# Patient Record
Sex: Female | Born: 1935 | Race: White | Hispanic: No | Smoking: Never smoker
Health system: Southern US, Community
[De-identification: ages and names within clinical notes are randomized; demographics above are authoritative.]

## PROBLEM LIST (undated history)

## (undated) ENCOUNTER — Emergency Department (HOSPITAL_COMMUNITY): Admission: EM | Payer: Medicare Other

## (undated) DIAGNOSIS — E559 Vitamin D deficiency, unspecified: Secondary | ICD-10-CM

## (undated) DIAGNOSIS — E785 Hyperlipidemia, unspecified: Secondary | ICD-10-CM

## (undated) DIAGNOSIS — N200 Calculus of kidney: Secondary | ICD-10-CM

## (undated) DIAGNOSIS — F32A Depression, unspecified: Secondary | ICD-10-CM

## (undated) DIAGNOSIS — I10 Essential (primary) hypertension: Secondary | ICD-10-CM

## (undated) DIAGNOSIS — M1990 Unspecified osteoarthritis, unspecified site: Secondary | ICD-10-CM

## (undated) DIAGNOSIS — C439 Malignant melanoma of skin, unspecified: Secondary | ICD-10-CM

## (undated) DIAGNOSIS — D649 Anemia, unspecified: Secondary | ICD-10-CM

## (undated) DIAGNOSIS — I499 Cardiac arrhythmia, unspecified: Secondary | ICD-10-CM

## (undated) DIAGNOSIS — I82409 Acute embolism and thrombosis of unspecified deep veins of unspecified lower extremity: Secondary | ICD-10-CM

## (undated) DIAGNOSIS — N189 Chronic kidney disease, unspecified: Secondary | ICD-10-CM

## (undated) DIAGNOSIS — C801 Malignant (primary) neoplasm, unspecified: Secondary | ICD-10-CM

## (undated) DIAGNOSIS — I2699 Other pulmonary embolism without acute cor pulmonale: Secondary | ICD-10-CM

## (undated) DIAGNOSIS — F329 Major depressive disorder, single episode, unspecified: Secondary | ICD-10-CM

## (undated) DIAGNOSIS — M5090 Cervical disc disorder, unspecified, unspecified cervical region: Secondary | ICD-10-CM

## (undated) DIAGNOSIS — Z87440 Personal history of urinary (tract) infections: Secondary | ICD-10-CM

## (undated) HISTORY — PX: ABDOMINAL HYSTERECTOMY: SHX81

## (undated) HISTORY — PX: JOINT REPLACEMENT: SHX530

## (undated) HISTORY — PX: BACK SURGERY: SHX140

## (undated) HISTORY — PX: TONSILLECTOMY: SUR1361

## (undated) HISTORY — PX: COLONOSCOPY W/ POLYPECTOMY: SHX1380

## (undated) HISTORY — PX: OTHER SURGICAL HISTORY: SHX169

---

## 1977-01-13 HISTORY — PX: BREAST EXCISIONAL BIOPSY: SUR124

## 2004-07-23 ENCOUNTER — Ambulatory Visit (HOSPITAL_COMMUNITY): Admission: RE | Admit: 2004-07-23 | Discharge: 2004-07-23 | Payer: Self-pay | Admitting: Neurosurgery

## 2004-08-06 ENCOUNTER — Ambulatory Visit (HOSPITAL_COMMUNITY): Admission: RE | Admit: 2004-08-06 | Discharge: 2004-08-07 | Payer: Self-pay | Admitting: Neurosurgery

## 2005-10-06 ENCOUNTER — Ambulatory Visit: Payer: Self-pay | Admitting: Internal Medicine

## 2006-03-24 ENCOUNTER — Ambulatory Visit: Payer: Self-pay | Admitting: Obstetrics and Gynecology

## 2006-08-17 ENCOUNTER — Ambulatory Visit: Payer: Self-pay | Admitting: Unknown Physician Specialty

## 2007-03-25 ENCOUNTER — Ambulatory Visit: Payer: Self-pay | Admitting: Obstetrics and Gynecology

## 2007-05-05 ENCOUNTER — Ambulatory Visit: Payer: Self-pay | Admitting: Orthopaedic Surgery

## 2007-06-11 ENCOUNTER — Ambulatory Visit: Payer: Self-pay | Admitting: Urology

## 2007-06-14 ENCOUNTER — Ambulatory Visit: Payer: Self-pay | Admitting: Urology

## 2007-06-23 ENCOUNTER — Ambulatory Visit: Payer: Self-pay | Admitting: Urology

## 2007-06-24 ENCOUNTER — Ambulatory Visit: Payer: Self-pay | Admitting: Orthopaedic Surgery

## 2007-07-20 ENCOUNTER — Ambulatory Visit: Payer: Self-pay | Admitting: Urology

## 2007-07-22 ENCOUNTER — Ambulatory Visit: Payer: Self-pay | Admitting: Urology

## 2007-07-30 ENCOUNTER — Ambulatory Visit: Payer: Self-pay | Admitting: Urology

## 2007-08-03 ENCOUNTER — Ambulatory Visit: Payer: Self-pay | Admitting: Orthopaedic Surgery

## 2007-08-10 ENCOUNTER — Ambulatory Visit: Payer: Self-pay | Admitting: Orthopaedic Surgery

## 2007-09-02 ENCOUNTER — Ambulatory Visit: Payer: Self-pay | Admitting: Urology

## 2007-12-13 ENCOUNTER — Ambulatory Visit: Payer: Self-pay | Admitting: Urology

## 2008-01-14 HISTORY — PX: TOTAL KNEE ARTHROPLASTY: SHX125

## 2008-03-24 ENCOUNTER — Ambulatory Visit: Payer: Self-pay | Admitting: Specialist

## 2008-03-27 ENCOUNTER — Ambulatory Visit: Payer: Self-pay | Admitting: Internal Medicine

## 2008-07-06 ENCOUNTER — Emergency Department: Payer: Self-pay | Admitting: Emergency Medicine

## 2008-08-15 ENCOUNTER — Ambulatory Visit: Payer: Self-pay | Admitting: Internal Medicine

## 2008-11-14 ENCOUNTER — Inpatient Hospital Stay (HOSPITAL_COMMUNITY): Admission: RE | Admit: 2008-11-14 | Discharge: 2008-11-17 | Payer: Self-pay | Admitting: Orthopedic Surgery

## 2009-03-29 ENCOUNTER — Ambulatory Visit: Payer: Self-pay | Admitting: Internal Medicine

## 2010-04-01 ENCOUNTER — Ambulatory Visit: Payer: Self-pay | Admitting: Internal Medicine

## 2010-04-17 LAB — BASIC METABOLIC PANEL
BUN: 13 mg/dL (ref 6–23)
CO2: 29 mEq/L (ref 19–32)
Calcium: 8.1 mg/dL — ABNORMAL LOW (ref 8.4–10.5)
GFR calc Af Amer: >60 mL/min (ref >60)
GFR calc Af Amer: >60 mL/min (ref >60)
GFR calc non Af Amer: >60 mL/min (ref >60)
GFR calc non Af Amer: >60 mL/min (ref >60)
Glucose, Bld: 119 mg/dL — ABNORMAL HIGH (ref 70–99)
Glucose, Bld: 124 mg/dL — ABNORMAL HIGH (ref 70–99)
Potassium: 3.6 mEq/L (ref 3.5–5.1)
Potassium: 4.2 mEq/L (ref 3.5–5.1)
Sodium: 134 mEq/L — ABNORMAL LOW (ref 135–145)
Sodium: 139 mEq/L (ref 135–145)

## 2010-04-17 LAB — CBC
HCT: 28.5 % — ABNORMAL LOW (ref 36.0–46.0)
HCT: 30.2 % — ABNORMAL LOW (ref 36.0–46.0)
Hemoglobin: 10.1 g/dL — ABNORMAL LOW (ref 12.0–15.0)
MCV: 94.4 fL (ref 78.0–100.0)
MCV: 95.7 fL (ref 78.0–100.0)
RBC: 3.02 MIL/uL — ABNORMAL LOW (ref 3.87–5.11)
RBC: 3.16 MIL/uL — ABNORMAL LOW (ref 3.87–5.11)
RDW: 13.1 % (ref 11.5–15.5)
WBC: 8.7 10*3/uL (ref 4.0–10.5)

## 2010-04-18 LAB — DIFFERENTIAL
Basophils Absolute: 0 10*3/uL (ref 0.0–0.1)
Lymphocytes Relative: 26 % (ref 12–46)
Monocytes Relative: 9 % (ref 3–12)
Neutro Abs: 3.6 10*3/uL (ref 1.7–7.7)
Neutrophils Relative %: 63 % (ref 43–77)

## 2010-04-18 LAB — APTT: aPTT: 30 seconds (ref 24–37)

## 2010-04-18 LAB — CBC
HCT: 40 % (ref 36.0–46.0)
Hemoglobin: 13.4 g/dL (ref 12.0–15.0)
MCHC: 33.4 g/dL (ref 30.0–36.0)
MCV: 94.1 fL (ref 78.0–100.0)
Platelets: 280 10*3/uL (ref 150–400)
RBC: 4.25 MIL/uL (ref 3.87–5.11)
RDW: 13.1 % (ref 11.5–15.5)

## 2010-04-18 LAB — BASIC METABOLIC PANEL
BUN: 14 mg/dL (ref 6–23)
CO2: 33 mEq/L — ABNORMAL HIGH (ref 19–32)
Calcium: 9 mg/dL (ref 8.4–10.5)
GFR calc Af Amer: >60 mL/min (ref >60)
Potassium: 4.3 mEq/L (ref 3.5–5.1)
Sodium: 138 mEq/L (ref 135–145)

## 2010-04-18 LAB — URINE MICROSCOPIC-ADD ON

## 2010-04-18 LAB — ABO/RH: ABO/RH(D): O POS

## 2010-04-18 LAB — URINALYSIS, ROUTINE W REFLEX MICROSCOPIC
Bilirubin Urine: NEGATIVE
Protein, ur: NEGATIVE mg/dL
Specific Gravity, Urine: 1.015 (ref 1.005–1.030)
Urobilinogen, UA: 0.2 mg/dL (ref 0.0–1.0)
pH: 7 (ref 5.0–8.0)

## 2010-04-18 LAB — TYPE AND SCREEN: Antibody Screen: NEGATIVE

## 2010-05-31 NOTE — Op Note (Signed)
NAME:  Anita Bailey, SWISS NO.:  [id number]   MEDICAL RECORD NO.:  [id number]          PATIENT TYPE:  OIB   LOCATION:  2899                         FACILITY:  [facility]   PHYSICIAN:  Reinaldo Meeker, M.D. DATE OF BIRTH:  May 18, 1934   DATE OF PROCEDURE:  08/06/2004  DATE OF DISCHARGE:                                 OPERATIVE REPORT   PREOPERATIVE DIAGNOSIS:  Spinal stenosis L4-L5.   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   PROCEDURE:  Bilateral L4-L5 decompressive laminotomies followed by right L4-  L5 microdiscectomy.   SECONDARY PROCEDURE:  Microdissection L4-L5 disc and L5 nerve root  bilaterally.   SURGEON:  Reinaldo Meeker, M.D.   ASSISTANT:  Kathaleen Maser. Pool, M.D.   PROCEDURE IN DETAIL:  After being placed in the prone position, the  patient's back was prepped and draped in the usual sterile fashion.  A  localizing x-ray was taken prior to incision to identify the appropriate  level.  A midline incision was made above the spinous processes of L4 and  L5.  Using Bovie cutting current, the incision was carried down to the  spinous processes.  Subperiosteal dissection was then carried out  bilaterally on the spinous processes and lamina.  Self-retaining retractor  was placed for exposure.  An x-ray showed we approached the appropriate  level.  Starting on the patient's left side, a laminotomy was performed by  removing the inferior 1/2 of the L4 lamina and the medial 1/3 of the facet  joint and the superior 1/2 of the L5 lamina.  Residual bone and ligamentum  flavum were removed in a piecemeal fashion.  A similar decompression was  then carried out on the patient's right side, once again, removing the  inferior 1/2 of the L4 lamina, the medial 1/3 of the facet joint, and the  superior 1/2 of the L5 lamina.  Once again, residual bone and ligamentum  flavum were removed in a piecemeal fashion.  Ligament under the spinous  process in the midline was also removed to effect a midline decompression.  At this time, the microscope was draped, brought onto the field, and used  for the remainder of the case.  Within the patient's right side,  microdissection technique was used to identify the L4-L5 disc.  This was  found to be diffusely herniated with neural compression.  It was elected to  incise the disc and clean it out bilaterally with a single sided approach  through the right side.  The annulus on the right was, therefore, incised  with a 15 blade.  With the use of pituitary rongeurs and curets, disc space  clean out was carried out and great care was taken to avoid injury to the  neural elements.  This was successfully done.  At this point, inspection was  carried out in all direction without any evidence of residual compression  and none could be identified.  Large amounts of irrigation was carried out  at this time with any bleeding controlled with bipolar coagulation and  Gelfoam.  The wound was then closed using interrupted Vicryl in the muscle,  fascia, subcutaneous and subcuticular tissues, and staples on the skin.  Sterile dressings were then applied and the patient was extubated and taken  to the recovery room in stable condition.     ROK/MEDQ  D:  08/06/2004  T:  08/06/2004  Job:  [id number]

## 2010-08-30 ENCOUNTER — Emergency Department: Payer: Self-pay | Admitting: Emergency Medicine

## 2010-09-04 ENCOUNTER — Ambulatory Visit: Payer: Self-pay | Admitting: Physician Assistant

## 2010-09-04 ENCOUNTER — Inpatient Hospital Stay: Payer: Self-pay | Admitting: Internal Medicine

## 2010-11-07 ENCOUNTER — Emergency Department: Payer: Self-pay | Admitting: Emergency Medicine

## 2011-04-02 ENCOUNTER — Ambulatory Visit: Payer: Self-pay | Admitting: Internal Medicine

## 2011-04-07 ENCOUNTER — Ambulatory Visit: Payer: Self-pay | Admitting: Internal Medicine

## 2011-07-31 ENCOUNTER — Encounter (HOSPITAL_COMMUNITY): Payer: Self-pay | Admitting: Pharmacy Technician

## 2011-08-05 ENCOUNTER — Ambulatory Visit (HOSPITAL_COMMUNITY)
Admission: RE | Admit: 2011-08-05 | Discharge: 2011-08-05 | Disposition: A | Discharge status: Home / Self Care | Payer: Medicare Other | Source: Ambulatory Visit | Attending: Orthopedic Surgery | Admitting: Orthopedic Surgery

## 2011-08-05 ENCOUNTER — Encounter (HOSPITAL_COMMUNITY): Payer: Self-pay

## 2011-08-05 ENCOUNTER — Encounter (HOSPITAL_COMMUNITY)
Admission: RE | Admit: 2011-08-05 | Discharge: 2011-08-05 | Disposition: A | Discharge status: Home / Self Care | Payer: Medicare Other | Source: Ambulatory Visit | Attending: Orthopedic Surgery | Admitting: Orthopedic Surgery

## 2011-08-05 DIAGNOSIS — I10 Essential (primary) hypertension: Secondary | ICD-10-CM | POA: Insufficient documentation

## 2011-08-05 DIAGNOSIS — Z86711 Personal history of pulmonary embolism: Secondary | ICD-10-CM | POA: Insufficient documentation

## 2011-08-05 DIAGNOSIS — Z01812 Encounter for preprocedural laboratory examination: Secondary | ICD-10-CM | POA: Insufficient documentation

## 2011-08-05 DIAGNOSIS — M954 Acquired deformity of chest and rib: Secondary | ICD-10-CM | POA: Insufficient documentation

## 2011-08-05 DIAGNOSIS — M1710 Unilateral primary osteoarthritis, unspecified knee: Secondary | ICD-10-CM | POA: Insufficient documentation

## 2011-08-05 HISTORY — DX: Essential (primary) hypertension: I10

## 2011-08-05 HISTORY — DX: Chronic kidney disease, unspecified: N18.9

## 2011-08-05 HISTORY — DX: Other pulmonary embolism without acute cor pulmonale: I26.99

## 2011-08-05 HISTORY — DX: Malignant (primary) neoplasm, unspecified: C80.1

## 2011-08-05 HISTORY — DX: Cardiac arrhythmia, unspecified: I49.9

## 2011-08-05 HISTORY — DX: Unspecified osteoarthritis, unspecified site: M19.90

## 2011-08-05 NOTE — Patient Instructions (Signed)
20 LAVEDA DEMEDEIROS  08/05/2011   Your procedure is scheduled on: 08/11/11 [id number]  Report to [facility] at 0530 AM.  Call this number if you have problems the morning of surgery: [phone]   Remember:   Do not eat food:After Midnight.  May have clear liquids:until Midnight .    Take these medicines the morning of surgery with A SIP OF WATER:   Do not wear jewelry, make-up or nail polish.  Do not wear lotions, powders, or perfumes  Do not shave 48 hours prior to surgery.   Do not bring valuables to the hospital.  Contacts, dentures or bridgework may not be worn into surgery.  Leave suitcase in the car. After surgery it may be brought to your room.  For patients admitted to the hospital, checkout time is 11:00 AM the day of discharge.     Special Instructions: CHG Shower Use Special Wash: 1/2 bottle night before surgery and 1/2 bottle morning of surgery. shower chin to toes with CHG.  Wash face and private parts with regular soap.   Please read over the following fact sheets that you were given: MRSA Information, Blood Transfusion Fact Sheet, Incentive Spirometry Fact Sheet, coughing and deep breathing exercises, leg exercises

## 2011-08-05 NOTE — Progress Notes (Signed)
08/05/11 FAx and confirmation received abnormal urinalysis done by PCP on 07/29/11 to DR Charlann Boxer.

## 2011-08-06 MED ORDER — CEFAZOLIN SODIUM 1-5 GM-% IV SOLN: 1.0000 g | INTRAVENOUS | Status: DC

## 2011-08-06 NOTE — H&P (Signed)
Anita Bailey is an 77 y.o. female.    Chief Complaint: left knee OA and pain   HPI: Pt is a 77 y.o. female complaining of left knee pain since August of 2012.  Pain start with 2 falls on her driveway that both cause lacerations across the knee.  After that time she was walking in [place], [place] when she had a horrible pain in her left knee which she states was later diagnosed as a meniscus tear.  She had originally when a a Educational psychologist in [place] and then transferred to Dr. Charlann Boxer.  Pain had continually increased since the beginning. X-rays in the clinic and a MRI show end-stage arthritic and degenerative changes of the left knee. Pt has tried various conservative treatments which have failed to alleviate their symptoms including NSAIDs and steroid injections. Various options are discussed with the patient. Risks, benefits and expectations were discussed with the patient. Patient understand the risks, benefits and expectations and wishes to proceed with surgery.   PCP:  Danella Penton., [place]  D/C Plans:  SNF/Rehab  Post-op Meds:  No Rx given - Will need to start on Xarelto for 2 weeks and then ASA  Tranexamic Acid:   Not to be given - previous PE  Decadron:   To be given  PMH: Past Medical History  Diagnosis Date  . Hypertension   . Dysrhythmia     hx of heart racing   . Chronic kidney disease     current uti 08/05/11   . Cancer     hx of melanoma in 1971, basal cell   . Arthritis   . Pulmonary emboli     bilateral 8/12 hospitalized     PSH: Past Surgical History  Procedure Date  . Other surgical history     melanoma removed   . Left wrist      surgery x 2   . Abdominal hysterectomy     1981    Social History:  reports that she has never smoked. She has never used smokeless tobacco. She reports that she does not use illicit drugs. Her alcohol history not on file.  Allergies:  Allergies  Allergen Reactions  . Penicillins Anaphylaxis  . Clindamycin/Lincomycin Rash    . Diclofenac Rash    Medications: No current facility-administered medications for this encounter.   Current Outpatient Prescriptions  Medication Sig Dispense Refill  . ciprofloxacin (CIPRO) 250 MG tablet Take 250 mg by mouth 2 (two) times daily.      [name] HYDROcodone-acetaminophen (VICODIN) 5-500 MG per tablet Take 1 tablet by mouth every 6 (six) hours as needed. For pain      . metoprolol succinate (TOPROL-XL) 50 MG 24 hr tablet Take 50 mg by mouth every morning. Take with or immediately following a meal.      . naproxen sodium (ANAPROX) 220 MG tablet Take 440 mg by mouth 2 (two) times daily as needed.      . pregabalin (LYRICA) 25 MG capsule Take 25 mg by mouth 2 (two) times daily.      [name] venlafaxine (EFFEXOR) 75 MG tablet Take 75 mg by mouth every morning.      . zolpidem (AMBIEN) 5 MG tablet Take 2.5 mg by mouth at bedtime.       Facility-Administered Medications Ordered in Other Encounters  Medication Dose Route Frequency Provider Last Rate Last Dose  . DISCONTD: ceFAZolin (ANCEF) IVPB 1 g/50 mL premix  1 g Intravenous 60 min Pre-Op Genelle Gather [place], [place]  Results for orders placed during the hospital encounter of 08/05/11 (from the past 48 hour(s))  SURGICAL PCR SCREEN     Status: Normal   Collection Time   08/05/11 11:15 AM      Component Value Range Comment   MRSA, PCR NEGATIVE  NEGATIVE    Staphylococcus aureus NEGATIVE  NEGATIVE    Dg Chest 2 View  08/05/2011  *RADIOLOGY REPORT*  Clinical Data: Hypertension, pulmonary emboli history, preoperative evaluation for left knee replacement  CHEST - 2 VIEW  Comparison: None.  Findings: Normal heart size and vascularity.  Negative for CHF or pneumonia.  No collapse, consolidation, or effusion.  Right apical pleural parenchymal scarring evident.  Trachea is midline.  Slight upper thoracic scoliosis evident.  Pectus excavatum deformity suspect on the lateral view.  IMPRESSION: No acute chest finding.  Original Report Authenticated  By: Judie Petit. Ruel Favors, M.D.    ROS: Review of Systems  Constitutional: Negative.   HENT: Negative.   Eyes: Negative.   Respiratory: Negative.   Cardiovascular: Negative.   Gastrointestinal: Negative.   Genitourinary: Positive for urgency.  Musculoskeletal: Positive for joint pain.  Skin: Negative.   Neurological: Negative.   Endo/Heme/Allergies: Negative.   Psychiatric/Behavioral: Negative.      Physical Exam: BP: 113/45 ; HR: 66 ; Resp: 14 Physical Exam  Constitutional: She is oriented to person, place, and time and well-developed, well-nourished, and in no distress.  HENT:  Head: Normocephalic and atraumatic.  Nose: Nose normal.  Mouth/Throat: Oropharynx is clear and moist.  Eyes: Pupils are equal, round, and reactive to light.  Neck: Neck supple. No JVD present. No tracheal deviation present. No thyromegaly present.  Cardiovascular: Normal rate, regular rhythm and intact distal pulses.   Pulmonary/Chest: Effort normal and breath sounds normal. No respiratory distress. She has no wheezes. She has no rales. She exhibits no tenderness.  Abdominal: Soft. There is no tenderness. There is no guarding.  Musculoskeletal:       Left knee: She exhibits decreased range of motion, swelling, laceration (Healed laceration from previous fall. Distal portion of the knee that starts medial and goes toward the midline of the left knee.) and bony tenderness (with crepitus). She exhibits no effusion, no ecchymosis and no deformity. tenderness found.  Lymphadenopathy:    She has no cervical adenopathy.  Neurological: She is alert and oriented to person, place, and time.  Skin: Skin is warm and dry.  Psychiatric: Affect normal.      Assessment/Plan Assessment: left knee OA and pain   Plan: Patient will undergo a left total knee arthroplasty on 08/11/2011 per Dr. Charlann Boxer at [facility]. Risks benefits and expectation were discussed with the patient. Patient understand risks, benefits and  expectation and wishes to proceed.   Anastasio Auerbach Nikky Duba   PAC  08/06/2011, 5:31 PM

## 2011-08-06 NOTE — Progress Notes (Signed)
08/05/11 Faxed and confirmation received to Dr Charlann Boxer regarding penicillin allergy and ancef ordered preop.   08/06/11 received new order by fax from Dr Charlann Boxer to change preop antibiotic to  Vancomycin order placed in epic.  Placed fax from Dr Charlann Boxer on front of chart

## 2011-08-08 ENCOUNTER — Emergency Department: Payer: Self-pay | Admitting: Emergency Medicine

## 2011-08-10 NOTE — Anesthesia Preprocedure Evaluation (Addendum)
Anesthesia Evaluation  Patient identified by MRN, date of birth, ID band Patient awake  General Assessment Comment:Past Medical History   Diagnosis  Date   .  Hypertension     .  Dysrhythmia         hx of heart racing    .  Chronic kidney disease         current uti 08/05/11    .  Cancer         hx of melanoma in 1971, basal cell    .  Arthritis     .  Pulmonary emboli         bilateral 8/12 hospitalized      Reviewed: Allergy & Precautions, H&P , NPO status , Patient's Chart, lab work & pertinent test results  Airway Mallampati: II TM Distance: >3 FB Neck ROM: Full    Dental No notable dental hx.    Pulmonary  CXR: No acute chest finding.  H/O PE bilateral 8/12 breath sounds clear to auscultation  Pulmonary exam normal       Cardiovascular Exercise Tolerance: Good hypertension, Pt. on medications and Pt. on home beta blockers + dysrhythmias Rhythm:Regular Rate:Normal  H/O of heart racing.   Neuro/Psych negative neurological ROS  negative psych ROS   GI/Hepatic negative GI ROS, Neg liver ROS,   Endo/Other  negative endocrine ROS  Renal/GU negative Renal ROS  negative genitourinary   Musculoskeletal negative musculoskeletal ROS (+)   Abdominal   Peds negative pediatric ROS (+)  Hematology negative hematology ROS (+)   Anesthesia Other Findings   Reproductive/Obstetrics negative OB ROS                          Anesthesia Physical Anesthesia Plan  ASA: II  Anesthesia Plan: Spinal   Post-op Pain Management:    Induction: Intravenous  Airway Management Planned:   Additional Equipment:   Intra-op Plan:   Post-operative Plan: Extubation in OR  Informed Consent: I have reviewed the patients History and Physical, chart, labs and discussed the procedure including the risks, benefits and alternatives for the proposed anesthesia with the patient or authorized representative who  has indicated his/her understanding and acceptance.   Dental advisory given  Plan Discussed with: CRNA  Anesthesia Plan Comments: (Discussed risks/benefits of spinal including headache, backache, failure, bleeding, infection, and nerve damage. Patient consents to spinal. Questions answered. Coagulation studies and platelet count acceptable. )       Anesthesia Quick Evaluation

## 2011-08-11 ENCOUNTER — Encounter (HOSPITAL_COMMUNITY): Payer: Self-pay | Admitting: Anesthesiology

## 2011-08-11 ENCOUNTER — Inpatient Hospital Stay (HOSPITAL_COMMUNITY)
Admission: RE | Admit: 2011-08-11 | Discharge: 2011-08-14 | DRG: 470 | Disposition: A | Discharge status: Skilled Nursing Facility | Payer: Medicare Other | Source: Ambulatory Visit | Attending: Orthopedic Surgery | Admitting: Orthopedic Surgery

## 2011-08-11 ENCOUNTER — Encounter (HOSPITAL_COMMUNITY): Payer: Self-pay | Admitting: *Deleted

## 2011-08-11 ENCOUNTER — Ambulatory Visit (HOSPITAL_COMMUNITY): Payer: Medicare Other | Admitting: Anesthesiology

## 2011-08-11 ENCOUNTER — Encounter (HOSPITAL_COMMUNITY)
Admission: RE | Disposition: A | Discharge status: Skilled Nursing Facility | Payer: Self-pay | Source: Ambulatory Visit | Attending: Orthopedic Surgery

## 2011-08-11 DIAGNOSIS — Z9181 History of falling: Secondary | ICD-10-CM

## 2011-08-11 DIAGNOSIS — Z79899 Other long term (current) drug therapy: Secondary | ICD-10-CM

## 2011-08-11 DIAGNOSIS — M1710 Unilateral primary osteoarthritis, unspecified knee: Principal | ICD-10-CM | POA: Diagnosis present

## 2011-08-11 DIAGNOSIS — I10 Essential (primary) hypertension: Secondary | ICD-10-CM | POA: Diagnosis present

## 2011-08-11 DIAGNOSIS — Z96659 Presence of unspecified artificial knee joint: Secondary | ICD-10-CM

## 2011-08-11 DIAGNOSIS — Z85820 Personal history of malignant melanoma of skin: Secondary | ICD-10-CM

## 2011-08-11 DIAGNOSIS — Z86711 Personal history of pulmonary embolism: Secondary | ICD-10-CM

## 2011-08-11 HISTORY — PX: TOTAL KNEE ARTHROPLASTY: SHX125

## 2011-08-11 LAB — TYPE AND SCREEN: ABO/RH(D): O POS

## 2011-08-11 SURGERY — ARTHROPLASTY, KNEE, TOTAL
Anesthesia: Spinal | Site: Knee | Laterality: Left | Wound class: Clean

## 2011-08-11 MED ORDER — VANCOMYCIN HCL IN DEXTROSE 1-5 GM/200ML-% IV SOLN
1000.0000 mg | Freq: Once | INTRAVENOUS | Status: AC
  Administered 2011-08-11: 1000 mg via INTRAVENOUS

## 2011-08-11 MED ORDER — METHOCARBAMOL 100 MG/ML IJ SOLN
500.0000 mg | Freq: Four times a day (QID) | INTRAVENOUS | Status: DC | PRN
  Administered 2011-08-11: 500 mg via INTRAVENOUS
  Filled 2011-08-11: qty 5

## 2011-08-11 MED ORDER — BUPIVACAINE-EPINEPHRINE PF 0.25-1:200000 % IJ SOLN
INTRAMUSCULAR | Status: AC
  Filled 2011-08-11: qty 60

## 2011-08-11 MED ORDER — MENTHOL 3 MG MT LOZG
1.0000 | LOZENGE | OROMUCOSAL | Status: DC | PRN
  Filled 2011-08-11: qty 9

## 2011-08-11 MED ORDER — ONDANSETRON HCL 4 MG PO TABS: 4.0000 mg | ORAL_TABLET | Freq: Four times a day (QID) | ORAL | Status: DC | PRN

## 2011-08-11 MED ORDER — VENLAFAXINE HCL 75 MG PO TABS
75.0000 mg | ORAL_TABLET | Freq: Every morning | ORAL | Status: DC
  Administered 2011-08-12 – 2011-08-14 (×3): 75 mg via ORAL
  Filled 2011-08-11 (×3): qty 1

## 2011-08-11 MED ORDER — FLEET ENEMA 7-19 GM/118ML RE ENEM: 1.0000 | ENEMA | Freq: Once | RECTAL | Status: AC | PRN

## 2011-08-11 MED ORDER — ONDANSETRON HCL 4 MG/2ML IJ SOLN
INTRAMUSCULAR | Status: DC | PRN
  Administered 2011-08-11: 4 mg via INTRAVENOUS

## 2011-08-11 MED ORDER — ZOLPIDEM TARTRATE 5 MG PO TABS
2.5000 mg | ORAL_TABLET | Freq: Every evening | ORAL | Status: DC | PRN
  Administered 2011-08-11 – 2011-08-13 (×3): 5 mg via ORAL
  Filled 2011-08-11 (×3): qty 1

## 2011-08-11 MED ORDER — PREGABALIN 25 MG PO CAPS
25.0000 mg | ORAL_CAPSULE | Freq: Two times a day (BID) | ORAL | Status: DC
  Administered 2011-08-11 – 2011-08-14 (×6): 25 mg via ORAL
  Filled 2011-08-11 (×6): qty 1

## 2011-08-11 MED ORDER — HYDROMORPHONE HCL PF 1 MG/ML IJ SOLN: 0.2500 mg | INTRAMUSCULAR | Status: DC | PRN

## 2011-08-11 MED ORDER — SODIUM CHLORIDE 0.9 % IV SOLN
INTRAVENOUS | Status: AC
  Administered 2011-08-11: 12:00:00 via INTRAVENOUS
  Filled 2011-08-11 (×6): qty 1000

## 2011-08-11 MED ORDER — CHLORHEXIDINE GLUCONATE 4 % EX LIQD
60.0000 mL | Freq: Once | CUTANEOUS | Status: DC
  Filled 2011-08-11: qty 60

## 2011-08-11 MED ORDER — METOCLOPRAMIDE HCL 10 MG PO TABS: 5.0000 mg | ORAL_TABLET | Freq: Three times a day (TID) | ORAL | Status: DC | PRN

## 2011-08-11 MED ORDER — HYDROCODONE-ACETAMINOPHEN 7.5-325 MG PO TABS
1.0000 | ORAL_TABLET | ORAL | Status: DC
  Administered 2011-08-11: 1 via ORAL
  Administered 2011-08-11 – 2011-08-12 (×3): 2 via ORAL
  Administered 2011-08-12: 1 via ORAL
  Administered 2011-08-12: 2 via ORAL
  Administered 2011-08-12: 1 via ORAL
  Administered 2011-08-12 – 2011-08-13 (×3): 2 via ORAL
  Administered 2011-08-13 (×3): 1 via ORAL
  Administered 2011-08-13: 2 via ORAL
  Administered 2011-08-14: 1 via ORAL
  Filled 2011-08-11: qty 1
  Filled 2011-08-11 (×6): qty 2
  Filled 2011-08-11 (×3): qty 1
  Filled 2011-08-11 (×3): qty 2
  Filled 2011-08-11 (×2): qty 1

## 2011-08-11 MED ORDER — LACTATED RINGERS IV SOLN
INTRAVENOUS | Status: DC | PRN
  Administered 2011-08-11 (×3): via INTRAVENOUS

## 2011-08-11 MED ORDER — MIDAZOLAM HCL 5 MG/5ML IJ SOLN
INTRAMUSCULAR | Status: DC | PRN
  Administered 2011-08-11: 2 mg via INTRAVENOUS

## 2011-08-11 MED ORDER — ACETAMINOPHEN 10 MG/ML IV SOLN
INTRAVENOUS | Status: DC | PRN
  Administered 2011-08-11: 1000 mg via INTRAVENOUS

## 2011-08-11 MED ORDER — PHENOL 1.4 % MT LIQD
1.0000 | OROMUCOSAL | Status: DC | PRN
  Filled 2011-08-11: qty 177

## 2011-08-11 MED ORDER — ACETAMINOPHEN 10 MG/ML IV SOLN
INTRAVENOUS | Status: AC
  Filled 2011-08-11: qty 100

## 2011-08-11 MED ORDER — ONDANSETRON HCL 4 MG/2ML IJ SOLN: 4.0000 mg | Freq: Four times a day (QID) | INTRAMUSCULAR | Status: DC | PRN

## 2011-08-11 MED ORDER — VANCOMYCIN HCL IN DEXTROSE 1-5 GM/200ML-% IV SOLN
INTRAVENOUS | Status: AC
  Filled 2011-08-11: qty 200

## 2011-08-11 MED ORDER — FERROUS SULFATE 325 (65 FE) MG PO TABS
325.0000 mg | ORAL_TABLET | Freq: Three times a day (TID) | ORAL | Status: DC
  Administered 2011-08-11 – 2011-08-14 (×9): 325 mg via ORAL
  Filled 2011-08-11 (×11): qty 1

## 2011-08-11 MED ORDER — HYDROMORPHONE HCL PF 1 MG/ML IJ SOLN
0.5000 mg | INTRAMUSCULAR | Status: DC | PRN
  Filled 2011-08-11: qty 1

## 2011-08-11 MED ORDER — FENTANYL CITRATE 0.05 MG/ML IJ SOLN
INTRAMUSCULAR | Status: DC | PRN
  Administered 2011-08-11: 50 ug via INTRAVENOUS

## 2011-08-11 MED ORDER — METOPROLOL SUCCINATE ER 50 MG PO TB24
50.0000 mg | ORAL_TABLET | Freq: Every morning | ORAL | Status: DC
  Administered 2011-08-12 – 2011-08-14 (×3): 50 mg via ORAL
  Filled 2011-08-11 (×3): qty 1

## 2011-08-11 MED ORDER — BUPIVACAINE-EPINEPHRINE PF 0.25-1:200000 % IJ SOLN
INTRAMUSCULAR | Status: DC | PRN
  Administered 2011-08-11: 30 mL

## 2011-08-11 MED ORDER — DIPHENHYDRAMINE HCL 25 MG PO CAPS: 25.0000 mg | ORAL_CAPSULE | Freq: Four times a day (QID) | ORAL | Status: DC | PRN

## 2011-08-11 MED ORDER — PROPOFOL 10 MG/ML IV EMUL
INTRAVENOUS | Status: DC | PRN
  Administered 2011-08-11: 75 ug/kg/min via INTRAVENOUS

## 2011-08-11 MED ORDER — DOCUSATE SODIUM 100 MG PO CAPS
100.0000 mg | ORAL_CAPSULE | Freq: Two times a day (BID) | ORAL | Status: DC
Start: 2011-08-11 — End: 2011-08-14
  Administered 2011-08-11 – 2011-08-14 (×6): 100 mg via ORAL

## 2011-08-11 MED ORDER — METHOCARBAMOL 500 MG PO TABS
500.0000 mg | ORAL_TABLET | Freq: Four times a day (QID) | ORAL | Status: DC | PRN
  Administered 2011-08-13 (×3): 500 mg via ORAL
  Filled 2011-08-11 (×3): qty 1

## 2011-08-11 MED ORDER — POLYETHYLENE GLYCOL 3350 17 G PO PACK
17.0000 g | PACK | Freq: Two times a day (BID) | ORAL | Status: DC
  Administered 2011-08-11 – 2011-08-14 (×6): 17 g via ORAL

## 2011-08-11 MED ORDER — RIVAROXABAN 10 MG PO TABS
10.0000 mg | ORAL_TABLET | ORAL | Status: DC
  Administered 2011-08-12 – 2011-08-14 (×3): 10 mg via ORAL
  Filled 2011-08-11 (×5): qty 1

## 2011-08-11 MED ORDER — ALUM & MAG HYDROXIDE-SIMETH 200-200-20 MG/5ML PO SUSP: 30.0000 mL | ORAL | Status: DC | PRN

## 2011-08-11 MED ORDER — PROPOFOL 10 MG/ML IV BOLUS
INTRAVENOUS | Status: DC | PRN
  Administered 2011-08-11: 30 mg via INTRAVENOUS

## 2011-08-11 MED ORDER — VANCOMYCIN HCL IN DEXTROSE 1-5 GM/200ML-% IV SOLN
1000.0000 mg | Freq: Two times a day (BID) | INTRAVENOUS | Status: AC
  Administered 2011-08-11: 1000 mg via INTRAVENOUS
  Filled 2011-08-11: qty 200

## 2011-08-11 MED ORDER — DEXAMETHASONE SODIUM PHOSPHATE 10 MG/ML IJ SOLN
10.0000 mg | Freq: Once | INTRAMUSCULAR | Status: AC
  Administered 2011-08-12: 10 mg via INTRAVENOUS
  Filled 2011-08-11: qty 1

## 2011-08-11 MED ORDER — KETOROLAC TROMETHAMINE 30 MG/ML IJ SOLN
INTRAMUSCULAR | Status: DC | PRN
  Administered 2011-08-11: 30 mg

## 2011-08-11 MED ORDER — KETOROLAC TROMETHAMINE 30 MG/ML IJ SOLN
INTRAMUSCULAR | Status: AC
  Filled 2011-08-11: qty 1

## 2011-08-11 MED ORDER — PROMETHAZINE HCL 25 MG/ML IJ SOLN: 6.2500 mg | INTRAMUSCULAR | Status: DC | PRN

## 2011-08-11 MED ORDER — BISACODYL 5 MG PO TBEC: 5.0000 mg | DELAYED_RELEASE_TABLET | Freq: Every day | ORAL | Status: DC | PRN

## 2011-08-11 MED ORDER — DEXAMETHASONE SODIUM PHOSPHATE 10 MG/ML IJ SOLN
10.0000 mg | Freq: Once | INTRAMUSCULAR | Status: AC
  Administered 2011-08-11: 10 mg via INTRAVENOUS

## 2011-08-11 MED ORDER — METOCLOPRAMIDE HCL 5 MG/ML IJ SOLN: 5.0000 mg | Freq: Three times a day (TID) | INTRAMUSCULAR | Status: DC | PRN

## 2011-08-11 SURGICAL SUPPLY — 58 items
ADH SKN CLS APL DERMABOND .7 (GAUZE/BANDAGES/DRESSINGS) ×1
BAG SPEC THK2 15X12 ZIP CLS (MISCELLANEOUS) ×1
BAG ZIPLOCK 12X15 (MISCELLANEOUS) ×2 IMPLANT
BANDAGE ELASTIC 6 VELCRO ST LF (GAUZE/BANDAGES/DRESSINGS) ×2 IMPLANT
BANDAGE ESMARK 6X9 LF (GAUZE/BANDAGES/DRESSINGS) ×1 IMPLANT
BLADE SAW SGTL 13.0X1.19X90.0M (BLADE) ×2 IMPLANT
BNDG CMPR 9X6 STRL LF SNTH (GAUZE/BANDAGES/DRESSINGS) ×1
BNDG ESMARK 6X9 LF (GAUZE/BANDAGES/DRESSINGS) ×2
BOWL SMART MIX CTS (DISPOSABLE) ×2 IMPLANT
CEMENT HV SMART SET (Cement) ×4 IMPLANT
CLOTH BEACON ORANGE TIMEOUT ST (SAFETY) ×2 IMPLANT
CUFF TOURN SGL QUICK 34 (TOURNIQUET CUFF) ×2
CUFF TRNQT CYL 34X4X40X1 (TOURNIQUET CUFF) ×1 IMPLANT
DECANTER SPIKE VIAL GLASS SM (MISCELLANEOUS) ×2 IMPLANT
DERMABOND ADVANCED (GAUZE/BANDAGES/DRESSINGS) ×1
DERMABOND ADVANCED .7 DNX12 (GAUZE/BANDAGES/DRESSINGS) ×1 IMPLANT
DRAPE EXTREMITY T 121X128X90 (DRAPE) ×2 IMPLANT
DRAPE POUCH INSTRU U-SHP 10X18 (DRAPES) ×2 IMPLANT
DRAPE U-SHAPE 47X51 STRL (DRAPES) ×2 IMPLANT
DRSG AQUACEL AG ADV 3.5X10 (GAUZE/BANDAGES/DRESSINGS) ×2 IMPLANT
DRSG TEGADERM 4X4.75 (GAUZE/BANDAGES/DRESSINGS) ×2 IMPLANT
DURAPREP 26ML APPLICATOR (WOUND CARE) ×2 IMPLANT
ELECT REM PT RETURN 9FT ADLT (ELECTROSURGICAL) ×2
ELECTRODE REM PT RTRN 9FT ADLT (ELECTROSURGICAL) ×1 IMPLANT
EVACUATOR 1/8 PVC DRAIN (DRAIN) ×2 IMPLANT
FACESHIELD LNG OPTICON STERILE (SAFETY) ×10 IMPLANT
GAUZE SPONGE 2X2 8PLY STRL LF (GAUZE/BANDAGES/DRESSINGS) ×1 IMPLANT
GLOVE BIOGEL PI IND STRL 7.5 (GLOVE) ×1 IMPLANT
GLOVE BIOGEL PI IND STRL 8 (GLOVE) ×1 IMPLANT
GLOVE BIOGEL PI INDICATOR 7.5 (GLOVE) ×1
GLOVE BIOGEL PI INDICATOR 8 (GLOVE) ×1
GLOVE ECLIPSE 8.0 STRL XLNG CF (GLOVE) ×2 IMPLANT
GLOVE ORTHO TXT STRL SZ7.5 (GLOVE) ×4 IMPLANT
GOWN BRE IMP PREV XXLGXLNG (GOWN DISPOSABLE) ×4 IMPLANT
GOWN STRL NON-REIN LRG LVL3 (GOWN DISPOSABLE) ×2 IMPLANT
HANDPIECE INTERPULSE COAX TIP (DISPOSABLE) ×2
IMMOBILIZER KNEE 20 (SOFTGOODS)
IMMOBILIZER KNEE 20 THIGH 36 (SOFTGOODS) IMPLANT
KIT BASIN OR (CUSTOM PROCEDURE TRAY) ×2 IMPLANT
MANIFOLD NEPTUNE II (INSTRUMENTS) ×2 IMPLANT
NDL SAFETY ECLIPSE 18X1.5 (NEEDLE) ×1 IMPLANT
NEEDLE HYPO 18GX1.5 SHARP (NEEDLE) ×2
NS IRRIG 1000ML POUR BTL (IV SOLUTION) ×4 IMPLANT
PACK TOTAL JOINT (CUSTOM PROCEDURE TRAY) ×2 IMPLANT
POSITIONER SURGICAL ARM (MISCELLANEOUS) ×2 IMPLANT
SET HNDPC FAN SPRY TIP SCT (DISPOSABLE) ×1 IMPLANT
SET PAD KNEE POSITIONER (MISCELLANEOUS) ×2 IMPLANT
SPONGE GAUZE 2X2 STER 10/PKG (GAUZE/BANDAGES/DRESSINGS) ×1
SUCTION FRAZIER 12FR DISP (SUCTIONS) ×2 IMPLANT
SUT MNCRL AB 4-0 PS2 18 (SUTURE) ×2 IMPLANT
SUT VIC AB 1 CT1 36 (SUTURE) ×6 IMPLANT
SUT VIC AB 2-0 CT1 27 (SUTURE) ×6
SUT VIC AB 2-0 CT1 TAPERPNT 27 (SUTURE) ×3 IMPLANT
SYR 50ML LL SCALE MARK (SYRINGE) ×2 IMPLANT
TOWEL OR 17X26 10 PK STRL BLUE (TOWEL DISPOSABLE) ×4 IMPLANT
TRAY FOLEY CATH 14FRSI W/METER (CATHETERS) ×2 IMPLANT
WATER STERILE IRR 1500ML POUR (IV SOLUTION) ×2 IMPLANT
WRAP KNEE MAXI GEL POST OP (GAUZE/BANDAGES/DRESSINGS) ×2 IMPLANT

## 2011-08-11 NOTE — Op Note (Signed)
NAME:  Anita Bailey                      MEDICAL RECORD NO.:  [id number]                             FACILITY:  [facility]      PHYSICIAN:  Madlyn Frankel. Charlann Boxer, M.D.  DATE OF BIRTH:  01/27/1934      DATE OF PROCEDURE:  08/11/2011                                     OPERATIVE REPORT         PREOPERATIVE DIAGNOSIS:  Left knee osteoarthritis.      POSTOPERATIVE DIAGNOSIS:  Left knee osteoarthritis.      FINDINGS:  The patient was noted to have complete loss of cartilage and   bone-on-bone arthritis with associated osteophytes in the medial and patellofemoral compartments of   the knee with a significant synovitis and associated effusion.      PROCEDURE:  Left total knee replacement.      COMPONENTS USED:  DePuy rotating platform posterior stabilized knee   system, a size 4N femur, 3 tibia, 12.5 mm PS insert, and 38 patellar   button.      SURGEON:  Madlyn Frankel. Charlann Boxer, M.D.      ASSISTANT:  Lanney Gins, PA-C.      ANESTHESIA:  Spinal.      SPECIMENS:  None.      COMPLICATION:  None.      DRAINS:  One Hemovac.  EBL: 150cc      TOURNIQUET TIME:   Total Tourniquet Time Documented: Thigh (Left) - 33 minutes .      The patient was stable to the recovery room.      INDICATION FOR PROCEDURE:  [name] is a 77 y.o. female patient of   mine.  The patient had been seen, evaluated, and treated conservatively in the   office with medication, activity modification, and injections.  The patient had   radiographic changes of bone-on-bone arthritis with endplate sclerosis and osteophytes noted.      The patient failed conservative measures including medication, injections, and activity modification, and at this point was ready for more definitive measures.   Based on the radiographic changes and failed conservative measures, the patient   decided to proceed with total knee replacement.  Risks of infection,   DVT, component failure, need for revision surgery, postop course, and   expectations were all   discussed and reviewed.  Consent was obtained for benefit of pain   relief.      PROCEDURE IN DETAIL:  The patient was brought to the operative theater.   Once adequate anesthesia, preoperative antibiotics, 1 gm of Vancomycin administered, the patient was positioned supine with the left thigh tourniquet placed.  The  left lower extremity was prepped and draped in sterile fashion.  A time-   out was performed identifying the patient, planned procedure, and   extremity.      The left lower extremity was placed in the [facility] leg holder.  The leg was   exsanguinated, tourniquet elevated to 250 mmHg.  A midline incision was   made followed by median parapatellar arthrotomy.  Following initial   exposure, attention was first directed to the patella.  Precut  measurement was noted to be 23 mm.  I resected down to 14 mm and used a   38 patellar button to restore patellar height as well as cover the cut   surface.      The lug holes were drilled and a metal shim was placed to protect the   patella from retractors and saw blades.      At this point, attention was now directed to the femur.  The femoral   canal was opened with a drill, irrigated to try to prevent fat emboli.  An   intramedullary rod was passed at 3 degrees valgus, 10 mm of bone was   resected off the distal femur.  Following this resection, the tibia was   subluxated anteriorly.  Using the extramedullary guide, 10 mm of bone was resected off   the proximal lateral tibia.  We confirmed the gap would be   stable medially and laterally with a 10 mm insert as well as confirmed   the cut was perpendicular in the coronal plane, checking with an alignment rod.      Once this was done, I sized the femur to be a size 4 in the anterior-   posterior dimension, chose a narrpw component based on medial and   lateral dimension.  The size 4 rotation block was then pinned in   position anterior referenced using the  C-clamp to set rotation.  The   anterior, posterior, and  chamfer cuts were made without difficulty nor   notching making certain that I was along the anterior cortex to help   with flexion gap stability.      The final box cut was made off the lateral aspect of distal femur.      At this point, the tibia was sized to be a size 3, the size 3 tray was   then pinned in position through the medial third of the tubercle,   drilled, and keel punched.  Trial reduction was now carried with a 4N femur,  3 tibia, a 12.5 mm insert, and the 38 patella botton.  The knee was brought to   extension, full extension with good flexion stability with the patella   tracking through the trochlea without application of pressure.  Given   all these findings, the trial components removed.  Final components were   opened and cement was mixed.  The knee was irrigated with normal saline   solution and pulse lavage.  The synovial lining was   then injected with 0.25% Marcaine with epinephrine and 1 cc of Toradol,   total of 61 cc.      The knee was irrigated.  Final implants were then cemented onto clean and   dried cut surfaces of bone with the knee brought to extension with a 12.5 mm trial insert.      Once the cement had fully cured, the excess cement was removed   throughout the knee.  I confirmed I was satisfied with the range of   motion and stability, and the final 12.5 mm PS insert was chosen.  It was   placed into the knee.      The tourniquet had been let down at 33 minutes.  No significant   hemostasis required.  The medium Hemovac drain was placed deep.  The   extensor mechanism was then reapproximated using #1 Vicryl with the knee   in flexion.  The   remaining wound was closed with 2-0 Vicryl and  running 4-0 Monocryl.   The knee was cleaned, dried, dressed sterilely using Dermabond and   Aquacel dressing.  Drain site dressed separately.  The patient was then   brought to recovery room in stable  condition, tolerating the procedure   well.   Please note that Physician Assistant, Lanney Gins, was present for the entirety of the case, and was utilized for pre-operative positioning, peri-operative retractor management, general facilitation of the procedure.  He was also utilized for primary wound closure at the end of the case.              Madlyn Frankel Charlann Boxer, M.D.

## 2011-08-11 NOTE — Progress Notes (Signed)
Utilization review completed.  

## 2011-08-11 NOTE — Transfer of Care (Signed)
Immediate Anesthesia Transfer of Care Note  Patient: Anita Bailey  Procedure(s) Performed: Procedure(s) (LRB): TOTAL KNEE ARTHROPLASTY (Left)  Patient Location: PACU  Anesthesia Type: Spinal  Level of Consciousness: awake, alert , oriented, patient cooperative and responds to stimulation  Airway & Oxygen Therapy: Patient Spontanous Breathing and Patient connected to face mask oxygen  Post-op Assessment: Report given to PACU RN, Post -op Vital signs reviewed and stable and Patient moving all extremities  Post vital signs: stable  Complications: No apparent anesthesia complications  L#3 spinal level, moving bilateral feet

## 2011-08-11 NOTE — Anesthesia Postprocedure Evaluation (Signed)
  Anesthesia Post-op Note  Patient: Anita Bailey  Procedure(s) Performed: Procedure(s) (LRB): TOTAL KNEE ARTHROPLASTY (Left)  Patient Location: PACU  Anesthesia Type: Spinal  Level of Consciousness: awake and alert   Airway and Oxygen Therapy: Patient Spontanous Breathing  Post-op Pain: mild  Post-op Assessment: Post-op Vital signs reviewed, Patient's Cardiovascular Status Stable, Respiratory Function Stable, Patent Airway and No signs of Nausea or vomiting  Post-op Vital Signs: stable  Complications: No apparent anesthesia complications

## 2011-08-11 NOTE — Addendum Note (Signed)
Addendum  created 08/11/11 1031 by Darrah Dredge E Sherwin Hollingshed, CRNA   Modules edited:Anesthesia Medication Administration    

## 2011-08-11 NOTE — Addendum Note (Signed)
Addendum  created 08/11/11 1031 by Illene Silver, CRNA   Modules edited:Anesthesia Medication Administration

## 2011-08-11 NOTE — Anesthesia Procedure Notes (Signed)
Spinal  Patient location during procedure: pre-op Start time: 08/11/2011 7:40 AM End time: 08/11/2011 7:45 AM Staffing CRNA/Resident: Abisola Carrero E Performed by: resident/CRNA  Preanesthetic Checklist Completed: patient identified, site marked, surgical consent, pre-op evaluation, timeout performed, IV checked, risks and benefits discussed and monitors and equipment checked Spinal Block Patient position: sitting Prep: Betadine Patient monitoring: continuous pulse ox and blood pressure Approach: midline Location: L4-5 Injection technique: single-shot Needle Needle type: Spinocan  Needle gauge: 22 G Needle insertion depth: 3 cm Assessment Sensory level: T10 Additional Notes CSF return x 3 no heme  First attempt. Could move right  Foot but not left. Good analgesia with incision. LOt  [id number] expires 09/2012

## 2011-08-11 NOTE — Interval H&P Note (Signed)
History and Physical Interval Note:  08/11/2011 7:20 AM  Anita Bailey  has presented today for surgery, with the diagnosis of Osteoarthritis of the Left Knee  The various methods of treatment have been discussed with the patient and family. After consideration of risks, benefits and other options for treatment, the patient has consented to  Procedure(s) (LRB):LEFT TOTAL KNEE ARTHROPLASTY (Left) as a surgical intervention .  The patient's history has been reviewed, patient examined, no change in status, stable for surgery.  I have reviewed the patient's chart and labs.  Questions were answered to the patient's satisfaction.     [name]

## 2011-08-11 NOTE — Plan of Care (Signed)
Problem: Consults Goal: Total Joint Replacement Patient Education See Patient Education Module for education specifics. Outcome: Completed/Met Date Met:  08/11/11 Educated patient and gave handouts. Handouts by bedside. Pt agreed to follow pt safety plan

## 2011-08-12 LAB — BASIC METABOLIC PANEL
GFR calc Af Amer: 75 mL/min — ABNORMAL LOW (ref >90)
GFR calc non Af Amer: 64 mL/min — ABNORMAL LOW (ref >90)
Potassium: 4.7 mEq/L (ref 3.5–5.1)
Sodium: 139 mEq/L (ref 135–145)

## 2011-08-12 LAB — CBC
MCHC: 32.1 g/dL (ref 30.0–36.0)
RDW: 13.6 % (ref 11.5–15.5)

## 2011-08-12 NOTE — Progress Notes (Signed)
Clinical Social Work Department CLINICAL SOCIAL WORK PLACEMENT NOTE 08/12/2011  Patient:  Anita Bailey, Anita Bailey  Account Number:  [id number] Admit date:  08/11/2011  Clinical Social Worker:  [name], LCSW  Date/time:  08/12/2011 03:26 PM  Clinical Social Work is seeking post-discharge placement for this patient at the following level of care:   SKILLED NURSING   (*CSW will update this form in Epic as items are completed)     Patient/family provided with [facility] Department of Clinical Social Work's list of facilities offering this level of care within the geographic area requested by the patient (or if unable, by the patient's family).    Patient/family informed of their freedom to choose among providers that offer the needed level of care, that participate in Medicare, Medicaid or managed care program needed by the patient, have an available bed and are willing to accept the patient.    Patient/family informed of [facility]' ownership interest in [facility], as well as of the fact that they are under no obligation to receive care at this facility.  PASARR submitted to E[employer] on  PASARR number received from [employer] on 11/15/2008  FL2 transmitted to all facilities in geographic area requested by pt/family on  08/11/2011 FL2 transmitted to all facilities within larger geographic area on   Patient informed that his/her managed care company has contracts with or will negotiate with  certain facilities, including the following:     Patient/family informed of bed offers received:  08/12/2011 Patient chooses bed at [facility] Physician recommends and patient chooses bed at    Patient to be transferred to [facility] on   Patient to be transferred to facility by   The following physician request were entered in Epic:   Additional Comments:  [name] LCSW [phone]

## 2011-08-12 NOTE — Evaluation (Signed)
Physical Therapy Evaluation Patient Details Name: Anita Bailey MRN: [id number] DOB: 1934/02/26 Today's Date: 08/12/2011 Time: 4098-1191 PT Time Calculation (min): 30 min  PT Assessment / Plan / Recommendation Clinical Impression  Pt with L TKR presents with decreased L LE strength/ROM and limiations in functional mobility    PT Assessment  Patient needs continued PT services    Follow Up Recommendations  Skilled nursing facility (limited assist at home)    Barriers to Discharge        Equipment Recommendations  Defer to next venue    Recommendations for Other Services     Frequency 7X/week    Precautions / Restrictions Precautions Precautions: Knee Restrictions Weight Bearing Restrictions: No   Pertinent Vitals/Pain 3/10; pt premedicated, ice packs provided      Mobility  Bed Mobility Bed Mobility: Supine to Sit Supine to Sit: 4: Min assist Details for Bed Mobility Assistance: cues for sequence and use of UEs to self assist Transfers Transfers: Sit to Stand;Stand to Sit Sit to Stand: 4: Min assist Stand to Sit: 4: Min assist Details for Transfer Assistance: cues for LE management and use of UEs to self assist Ambulation/Gait Ambulation/Gait Assistance: 4: Min assist Ambulation Distance (Feet): 79 Feet Assistive device: Rolling walker Ambulation/Gait Assistance Details: cues for sequence, posture and position from RW Gait Pattern: Step-to pattern;Decreased stance time - left    Exercises Total Joint Exercises Ankle Circles/Pumps: AROM;Both;10 reps;Supine Quad Sets: AROM;Both;10 reps;Supine Heel Slides: AAROM;Left;10 reps;Supine Straight Leg Raises: AROM;AAROM;10 reps;Supine;Left   PT Diagnosis: Difficulty walking  PT Problem List: Decreased strength;Decreased range of motion;Decreased activity tolerance;Decreased mobility;Decreased knowledge of use of DME;Pain PT Treatment Interventions: DME instruction;Gait training;Stair training;Functional mobility  training;Therapeutic activities;Therapeutic exercise;Patient/family education   PT Goals Acute Rehab PT Goals PT Goal Formulation: With patient Time For Goal Achievement: 08/19/11 Potential to Achieve Goals: Good Pt will go Supine/Side to Sit: with supervision PT Goal: Supine/Side to Sit - Progress: Goal set today Pt will go Sit to Supine/Side: with supervision PT Goal: Sit to Supine/Side - Progress: Goal set today Pt will go Sit to Stand: with supervision PT Goal: Sit to Stand - Progress: Goal set today Pt will go Stand to Sit: with supervision PT Goal: Stand to Sit - Progress: Goal set today Pt will Ambulate: 51 - 150 feet;with supervision;with rolling walker PT Goal: Ambulate - Progress: Goal set today  Visit Information  Last PT Received On: 08/12/11 Assistance Needed: +1    Subjective Data  Subjective: This one hurts a lot less than the first one I had done Patient Stated Goal: rehb and then home to resume previous lifestyle with decreased pain   Prior Functioning  Home Living Lives With: Alone Prior Function Level of Independence: Independent Able to Take Stairs?: Yes Communication Communication: No difficulties Dominant Hand: Right    Cognition  Overall Cognitive Status: Appears within functional limits for tasks assessed/performed Arousal/Alertness: Awake/alert Orientation Level: Appears intact for tasks assessed Behavior During Session: [facility] for tasks performed    Extremity/Trunk Assessment Right Upper Extremity Assessment RUE ROM/Strength/Tone: [facility] for tasks assessed Left Upper Extremity Assessment LUE ROM/Strength/Tone: WFL for tasks assessed Right Lower Extremity Assessment RLE ROM/Strength/Tone: [facility] for tasks assessed Left Lower Extremity Assessment LLE ROM/Strength/Tone: Deficits LLE ROM/Strength/Tone Deficits: 3/5 quads with AAROM at knee -10 - 115   Balance    End of Session PT - End of Session Activity Tolerance: Patient tolerated treatment  well Patient left: in chair;with call bell/phone within reach Nurse Communication: Mobility status  GP     Bryanda Mikel 08/12/2011, 12:44 PM

## 2011-08-12 NOTE — Progress Notes (Signed)
  Subjective: 1 Day Post-Op Procedure(s) (LRB): TOTAL KNEE ARTHROPLASTY (Left)   Patient reports pain as mild, pain well controlled. No events throughout the night. Feels that this is doing better than previous surgeries that she has had.  Objective:   VITALS:   Filed Vitals:   08/12/11 0800  BP: 97/59  Pulse: 59  Temp: 97.4 F (36.3 C)   Resp: 18    Neurovascular intact Dorsiflexion/Plantar flexion intact Incision: dressing C/D/I No cellulitis present Compartment soft  LABS  Basename 08/12/11 0415  HGB 9.5*  HCT 29.6*  WBC 9.4  PLT 215     Basename 08/12/11 0415  NA 139  K 4.7  BUN 18  CREATININE 0.86  GLUCOSE 113*     Assessment/Plan: 1 Day Post-Op Procedure(s) (LRB): TOTAL KNEE ARTHROPLASTY (Left)   HV drain d/c'ed Foley cath d/c'ed Advance diet Up with therapy D/C IV fluids Discharge to SNF eventually, when ready   Anita Bailey. Anita Bailey   PAC  08/12/2011, 8:39 AM

## 2011-08-12 NOTE — Progress Notes (Signed)
Physical Therapy Treatment Patient Details Name: Anita Bailey MRN: [id number] DOB: 08/19/1934 Today's Date: 08/12/2011 Time: 4098-1191 PT Time Calculation (min): 16 min  PT Assessment / Plan / Recommendation Comments on Treatment Session       Follow Up Recommendations  Skilled nursing facility    Barriers to Discharge        Equipment Recommendations  Defer to next venue    Recommendations for Other Services    Frequency 7X/week   Plan Discharge plan remains appropriate    Precautions / Restrictions Precautions Precautions: Knee Restrictions Weight Bearing Restrictions: No   Pertinent Vitals/Pain Min c/o pain    Mobility  Bed Mobility Bed Mobility: Sit to Supine Sit to Supine: 4: Min assist Details for Bed Mobility Assistance: cues for sequence and use of UEs to self assist Transfers Transfers: Sit to Stand;Stand to Sit Sit to Stand: 4: Min assist;4: Min guard Stand to Sit: 4: Min assist;4: Min guard Details for Transfer Assistance: cues for LE management and use of UEs to self assist Ambulation/Gait Ambulation/Gait Assistance: 4: Min assist;4: Min guard Ambulation Distance (Feet): 80 Feet (twice) Assistive device: Rolling walker Ambulation/Gait Assistance Details: cues for posture, sequence and position from RW Gait Pattern: Step-to pattern;Decreased stance time - left    Exercises     PT Diagnosis:    PT Problem List:   PT Treatment Interventions:     PT Goals Acute Rehab PT Goals PT Goal Formulation: With patient Time For Goal Achievement: 08/19/11 Potential to Achieve Goals: Good Pt will go Supine/Side to Sit: with supervision PT Goal: Supine/Side to Sit - Progress: Goal set today Pt will go Sit to Supine/Side: with supervision PT Goal: Sit to Supine/Side - Progress: Goal set today Pt will go Sit to Stand: with supervision PT Goal: Sit to Stand - Progress: Progressing toward goal Pt will go Stand to Sit: with supervision PT Goal: Stand to Sit -  Progress: Progressing toward goal Pt will Ambulate: 51 - 150 feet;with supervision;with rolling walker PT Goal: Ambulate - Progress: Progressing toward goal  Visit Information       Subjective Data  Subjective: I feel really good, lets go Patient Stated Goal: rehb and then home to resume previous lifestyle with decreased pain   Cognition  Overall Cognitive Status: Appears within functional limits for tasks assessed/performed Arousal/Alertness: Awake/alert Orientation Level: Appears intact for tasks assessed Behavior During Session: [facility] for tasks performed    Balance     End of Session PT - End of Session Activity Tolerance: Patient tolerated treatment well Patient left: in bed;with call bell/phone within reach;with family/visitor present Nurse Communication: Mobility status   GP     Jim Philemon 08/12/2011, 2:51 PM

## 2011-08-12 NOTE — Progress Notes (Signed)
Clinical Social Work Department BRIEF PSYCHOSOCIAL ASSESSMENT 08/12/2011  Patient:  Anita Bailey, Anita Bailey     Account Number:  [id number]     Admit date:  08/11/2011  Clinical Social Worker:  Candie Chroman  Date/Time:  08/12/2011 03:18 PM  Referred by:  Physician  Date Referred:  08/12/2011 Referred for  SNF Placement   Other Referral:   Interview type:  Patient Other interview type:    PSYCHOSOCIAL DATA Living Status:  ALONE Admitted from facility:   Level of care:   Primary support name:  Scarlett Presto Primary support relationship to patient:  FRIEND Degree of support available:   unclear    CURRENT CONCERNS Current Concerns  Post-Acute Placement   Other Concerns:    SOCIAL WORK ASSESSMENT / PLAN Pt is a 77 yr old female living at home prior to hospitalization. CSW met with pt to assist with d/c planning. Pt has made arrangements to have ST Rehab at [facility] following hospital d/c. CSW contacted SNF to confirm d/c plan. CSW will follow pt to assist with d/c planning to SNF.   Assessment/plan status:  Psychosocial Support/Ongoing Assessment of Needs Other assessment/ plan:   Information/referral to community resources:   None needed at this time.    PATIENT'S/FAMILY'S RESPONSE TO PLAN OF CARE: Pt is looking forward to SNF placement at [facility].     [name] LCSW [phone]

## 2011-08-13 ENCOUNTER — Encounter (HOSPITAL_COMMUNITY): Payer: Self-pay | Admitting: Orthopedic Surgery

## 2011-08-13 LAB — CBC
HCT: 26.7 % — ABNORMAL LOW (ref 36.0–46.0)
MCHC: 32.2 g/dL (ref 30.0–36.0)
MCV: 97.1 fL (ref 78.0–100.0)
RDW: 13.8 % (ref 11.5–15.5)

## 2011-08-13 LAB — BASIC METABOLIC PANEL
CO2: 28 mEq/L (ref 19–32)
Calcium: 8.2 mg/dL — ABNORMAL LOW (ref 8.4–10.5)
Glucose, Bld: 91 mg/dL (ref 70–99)

## 2011-08-13 MED ORDER — ASPIRIN EC 325 MG PO TBEC: 325.0000 mg | DELAYED_RELEASE_TABLET | Freq: Two times a day (BID) | ORAL | Status: AC

## 2011-08-13 MED ORDER — FERROUS SULFATE 325 (65 FE) MG PO TABS: 325.0000 mg | ORAL_TABLET | Freq: Three times a day (TID) | ORAL | Status: DC

## 2011-08-13 MED ORDER — POLYETHYLENE GLYCOL 3350 17 G PO PACK: 17.0000 g | PACK | Freq: Two times a day (BID) | ORAL | Status: AC

## 2011-08-13 MED ORDER — DIPHENHYDRAMINE HCL 25 MG PO CAPS: 25.0000 mg | ORAL_CAPSULE | Freq: Four times a day (QID) | ORAL | Status: DC | PRN

## 2011-08-13 MED ORDER — DSS 100 MG PO CAPS: 100.0000 mg | ORAL_CAPSULE | Freq: Two times a day (BID) | ORAL | Status: AC

## 2011-08-13 NOTE — Progress Notes (Signed)
Physical Therapy Treatment Patient Details Name: Anita Bailey MRN: [id number] DOB: Aug 15, 1934 Today's Date: 08/13/2011 Time: 4098-1191 PT Time Calculation (min): 32 min  PT Assessment / Plan / Recommendation Comments on Treatment Session       Follow Up Recommendations  Skilled nursing facility    Barriers to Discharge        Equipment Recommendations  Defer to next venue    Recommendations for Other Services    Frequency 7X/week   Plan Discharge plan remains appropriate    Precautions / Restrictions Precautions Precautions: Knee Restrictions Weight Bearing Restrictions: No   Pertinent Vitals/Pain 4/10; premedicated, cold packs provided    Mobility  Bed Mobility Bed Mobility: Sit to Supine Sit to Supine: 4: Min guard Transfers Transfers: Sit to Stand;Stand to Sit Sit to Stand: 4: Min guard Stand to Sit: 4: Min guard Details for Transfer Assistance: cues for LE management and use of UEs to self assist Ambulation/Gait Ambulation/Gait Assistance: 4: Min guard Ambulation Distance (Feet): 180 Feet Assistive device: Rolling walker Ambulation/Gait Assistance Details: min cues for posture and position from RW Gait Pattern: Step-to pattern;Step-through pattern;Decreased stance time - left    Exercises Total Joint Exercises Ankle Circles/Pumps: AROM;Both;20 reps;Supine Quad Sets: AROM;20 reps;Both;Supine Heel Slides: AAROM;20 reps;Left;Supine Straight Leg Raises: AROM;20 reps;Supine;Left   PT Diagnosis:    PT Problem List:   PT Treatment Interventions:     PT Goals Acute Rehab PT Goals PT Goal Formulation: With patient Time For Goal Achievement: 08/19/11 Potential to Achieve Goals: Good Pt will go Supine/Side to Sit: with supervision PT Goal: Supine/Side to Sit - Progress: Progressing toward goal Pt will go Sit to Supine/Side: with supervision PT Goal: Sit to Supine/Side - Progress: Progressing toward goal Pt will go Sit to Stand: with supervision PT Goal: Sit  to Stand - Progress: Progressing toward goal Pt will go Stand to Sit: with supervision PT Goal: Stand to Sit - Progress: Progressing toward goal Pt will Ambulate: 51 - 150 feet;with supervision;with rolling walker PT Goal: Ambulate - Progress: Progressing toward goal  Visit Information  Last PT Received On: 08/13/11 Assistance Needed: +1    Subjective Data  Subjective: I'm a little stiffer than yseterday but still doing pretty good Patient Stated Goal: rehb and then home to resume previous lifestyle with decreased pain   Cognition  Overall Cognitive Status: Appears within functional limits for tasks assessed/performed Arousal/Alertness: Awake/alert Orientation Level: Appears intact for tasks assessed Behavior During Session: [facility] for tasks performed    Balance     End of Session PT - End of Session Activity Tolerance: Patient tolerated treatment well Patient left: in bed;with call bell/phone within reach;with family/visitor present Nurse Communication: Mobility status   GP     Anita Bailey 08/13/2011, 12:34 PM

## 2011-08-13 NOTE — Discharge Summary (Signed)
Physician Discharge Summary  Patient ID: Anita Bailey MRN: [id number] DOB/AGE: 03/17/1934 77 y.o.  Admit date: 08/11/2011 Discharge date:  08/14/2011   Procedures:  Procedure(s) (LRB): TOTAL KNEE ARTHROPLASTY (Left)  Attending Physician:  Dr. Durene Romans   Admission Diagnoses:  Left knee OA and pain   Discharge Diagnoses:  Principal Problem:  *S/P left TKA Hypertension   Dysrhythmia   Cancer - hx of melanoma in 1971, basal cell   Arthritis   Pulmonary emboli - bilateral 8/12 hospitalized   HPI: Pt is a 77 y.o. female complaining of left knee pain since August of 2012. Pain start with 2 falls on her driveway that both cause lacerations across the knee. After that time she was walking in [place], [place] when she had a horrible pain in her left knee which she states was later diagnosed as a meniscus tear. She had originally when a a Educational psychologist in [place] and then transferred to Dr. Charlann Boxer. Pain had continually increased since the beginning. X-rays in the clinic and a MRI show end-stage arthritic and degenerative changes of the left knee. Pt has tried various conservative treatments which have failed to alleviate their symptoms including NSAIDs and steroid injections. Various options are discussed with the patient. Risks, benefits and expectations were discussed with the patient. Patient understand the risks, benefits and expectations and wishes to proceed with surgery.    PCP: Danella Penton., MD   Discharged Condition: good  Hospital Course:  Patient underwent the above stated procedure on 08/11/2011. Patient tolerated the procedure well and brought to the recovery room in good condition and subsequently to the floor.  POD #1 BP: 97/59 ; Pulse: 59 ; Temp: 97.4 F (36.3 C) ; Resp: 18  Pt's foley was removed, as well as the hemovac drain removed. IV was changed to a saline lock. Patient reports pain as mild, pain well controlled. No events throughout the night. Feels that this  is doing better than previous surgeries that she has had. Neurovascular intact, dorsiflexion/plantar flexion intact, incision: dressing C/D/I, no cellulitis present and compartment soft.   LABS  Basename  08/12/11 0415   HGB  9.5  HCT  29.6   POD #2  BP: 129/76 ; Pulse: 77 ; Temp: 98.3 F (36.8 C) ; Resp: 16  Patient reports pain as mild, pain well controlled. No events throughout the night. Working well with PT. Neurovascular intact, dorsiflexion/plantar flexion intact, incision: dressing C/D/I, no cellulitis present and compartment soft.   LABS  Basename  08/13/11 0405  HGB  8.6  HCT  26.7   POD #3  BP: 149/79 ; Pulse: 97 ; Temp: 99.2 F (37.3 C) ; Resp: 16  Patient reports pain as mild, pain well controlled. Little sleepy confusion this morning. Otherwise no events throughout the night. Ready to be discharged to SNF. Neurovascular intact, dorsiflexion/plantar flexion intact, incision: dressing C/D/I, no cellulitis present and compartment soft.   LABS   No new labs  Discharge Exam: General appearance: alert, cooperative and no distress Extremities: Homans sign is negative, no sign of DVT, no edema, redness or tenderness in the calves or thighs and no ulcers, gangrene or trophic changes  Disposition:  SNF with follow up in 2 weeks   Follow-up Information    Follow up with [name], MD in 2 weeks.   Contact information:   [facility] [address], [address] [place] [address] [phone]          Discharge Orders  Future Orders Please Complete By Expires   Diet - low sodium heart healthy      Call MD / Call 911      Comments:   If you experience chest pain or shortness of breath, CALL 911 and be transported to the hospital emergency room.  If you develope a fever above 101 F, pus (white drainage) or increased drainage or redness at the wound, or calf pain, call your surgeon's office.   Discharge instructions       Comments:   Maintain surgical dressing for 8 days, then replace with gauze and tape. Keep the area dry and clean until follow up. Follow up in 2 weeks at [facility]. Call with any questions or concerns.   Constipation Prevention      Comments:   Drink plenty of fluids.  Prune juice may be helpful.  You may use a stool softener, such as Colace (over the counter) 100 mg twice a day.  Use MiraLax (over the counter) for constipation as needed.   Increase activity slowly as tolerated      Driving restrictions      Comments:   No driving for 4 weeks   TED hose      Comments:   Use stockings (TED hose) for 2 weeks on both leg(s).  You may remove them at night for sleeping.   Change dressing      Comments:   Maintain surgical dressing for 8 days, then change the dressing daily with sterile 4 x 4 inch gauze dressing and tape. Keep the area dry and clean.       Current Discharge Medication List    START taking these medications   Details  aspirin EC 325 MG tablet Take 1 tablet (325 mg total) by mouth 2 (two) times daily. X 4 weeks Qty: 60 tablet, Refills: 0   Comments: Start after finishing the Xarelto    diphenhydrAMINE (BENADRYL) 25 mg capsule Take 1 capsule (25 mg total) by mouth every 6 (six) hours as needed for itching, allergies or sleep. Qty: 30 capsule    docusate sodium 100 MG CAPS Take 100 mg by mouth 2 (two) times daily. Qty: 10 capsule    ferrous sulfate 325 (65 FE) MG tablet Take 1 tablet (325 mg total) by mouth 3 (three) times daily after meals.    HYDROcodone-acetaminophen (NORCO/VICODIN) 5-325 MG per tablet Take 1-2 tablets by mouth every 4 (four) hours as needed for pain. Qty: 120 tablet, Refills: 0    polyethylene glycol (MIRALAX / GLYCOLAX) packet Take 17 g by mouth 2 (two) times daily. Qty: 14 each    rivaroxaban (XARELTO) 10 MG TABS tablet Take 1 tablet (10 mg total) by mouth daily. Qty: 12 tablet, Refills: 0      CONTINUE these medications which  have NOT CHANGED   Details  cyproheptadine (PERIACTIN) 4 MG tablet Take 4 mg by mouth 3 (three) times daily as needed. For allergies.    metoprolol succinate (TOPROL-XL) 50 MG 24 hr tablet Take 50 mg by mouth every morning. Take with or immediately following a meal.    pregabalin (LYRICA) 25 MG capsule Take 25 mg by mouth 2 (two) times daily.    venlafaxine (EFFEXOR) 75 MG tablet Take 75 mg by mouth every morning.    zolpidem (AMBIEN) 5 MG tablet Take 2.5 mg by mouth at bedtime.      STOP taking these medications     ciprofloxacin (CIPRO) 250 MG tablet Comments:  Reason  for Stopping:       HYDROcodone-acetaminophen (VICODIN) 5-500 MG per tablet Comments:  Reason for Stopping:       naproxen sodium (ANAPROX) 220 MG tablet Comments:  Reason for Stopping:          Signed:  Anastasio Auerbach. Malissia Rabbani   PAC  08/14/2011, 7:58 AM

## 2011-08-13 NOTE — Progress Notes (Signed)
  Subjective: 2 Days Post-Op Procedure(s) (LRB): TOTAL KNEE ARTHROPLASTY (Left)   Patient reports pain as mild, pain well controlled. No events throughout the night. Working well with PT.  Objective:   VITALS:   Filed Vitals:   08/13/11 1022  BP: 129/76  Pulse: 77  Temp: 98.3 F (36.8 C)  Resp: 16    Neurovascular intact Dorsiflexion/Plantar flexion intact Incision: dressing C/D/I No cellulitis present Compartment soft  LABS  Basename 08/13/11 0405 08/12/11 0415  HGB 8.6* 9.5*  HCT 26.7* 29.6*  WBC 9.6 9.4  PLT 208 215     Basename 08/13/11 0405 08/12/11 0415  NA 138 139  K 4.5 4.7  BUN 22 18  CREATININE 1.02 0.86  GLUCOSE 91 113*     Assessment/Plan: 2 Days Post-Op Procedure(s) (LRB): TOTAL KNEE ARTHROPLASTY (Left)   Up with therapy Approaching discharge to SNF    Anastasio Auerbach. Bryna Razavi   PAC  08/13/2011, 10:57 AM

## 2011-08-13 NOTE — Progress Notes (Signed)
Physical Therapy Treatment Patient Details Name: Anita Bailey MRN: [id number] DOB: 25-Jun-1934 Today's Date: 08/13/2011 Time: 5284-1324 PT Time Calculation (min): 19 min  PT Assessment / Plan / Recommendation Comments on Treatment Session       Follow Up Recommendations  Skilled nursing facility    Barriers to Discharge        Equipment Recommendations  Defer to next venue    Recommendations for Other Services    Frequency 7X/week   Plan Discharge plan remains appropriate    Precautions / Restrictions Precautions Precautions: Knee Restrictions Weight Bearing Restrictions: No   Pertinent Vitals/Pain 5/10; premedicated; ice packs provided    Mobility  Bed Mobility Bed Mobility: Supine to Sit;Sit to Supine Supine to Sit: 4: Min guard Sit to Supine: 4: Min guard Details for Bed Mobility Assistance: cues for sequence and use of UEs to self assist Transfers Transfers: Sit to Stand;Stand to Sit Sit to Stand: 4: Min guard Stand to Sit: 4: Min guard Details for Transfer Assistance: cues for LE management and use of UEs to self assist Ambulation/Gait Ambulation/Gait Assistance: 4: Min guard Ambulation Distance (Feet): 132 Feet (and 25') Assistive device: Rolling walker Ambulation/Gait Assistance Details: min cues for position from RW Gait Pattern: Step-to pattern;Step-through pattern;Decreased stance time - left    Exercises     PT Diagnosis:    PT Problem List:   PT Treatment Interventions:     PT Goals Acute Rehab PT Goals PT Goal Formulation: With patient Time For Goal Achievement: 08/19/11 Potential to Achieve Goals: Good Pt will go Supine/Side to Sit: with supervision PT Goal: Supine/Side to Sit - Progress: Progressing toward goal Pt will go Sit to Supine/Side: with supervision PT Goal: Sit to Supine/Side - Progress: Progressing toward goal Pt will go Sit to Stand: with supervision PT Goal: Sit to Stand - Progress: Progressing toward goal Pt will go Stand to  Sit: with supervision PT Goal: Stand to Sit - Progress: Progressing toward goal Pt will Ambulate: 51 - 150 feet;with supervision;with rolling walker PT Goal: Ambulate - Progress: Progressing toward goal  Visit Information  Last PT Received On: 08/13/11 Assistance Needed: +1    Subjective Data  Subjective: no new complaints Patient Stated Goal: rehb and then home to resume previous lifestyle with decreased pain   Cognition  Overall Cognitive Status: Appears within functional limits for tasks assessed/performed Arousal/Alertness: Awake/alert Orientation Level: Appears intact for tasks assessed Behavior During Session: [facility] for tasks performed    Balance     End of Session PT - End of Session Activity Tolerance: Patient tolerated treatment well Patient left: in bed;with call bell/phone within reach;with family/visitor present Nurse Communication: Mobility status   GP     Anita Bailey 08/13/2011, 3:21 PM

## 2011-08-13 NOTE — Progress Notes (Signed)
Occupational Therapy Note Order noted. See plan for SNF. Will defer eval to SNF. Judithann Sauger OTR/L [phone] 08/13/2011

## 2011-08-14 ENCOUNTER — Encounter: Payer: Self-pay | Admitting: Internal Medicine

## 2011-08-14 MED ORDER — HYDROCODONE-ACETAMINOPHEN 5-325 MG PO TABS
1.0000 | ORAL_TABLET | ORAL | Status: DC | PRN
  Administered 2011-08-14 (×2): 1 via ORAL
  Filled 2011-08-14 (×2): qty 1

## 2011-08-14 MED ORDER — SODIUM CHLORIDE 0.9 % IV BOLUS (SEPSIS)
250.0000 mL | Freq: Once | INTRAVENOUS | Status: AC
  Administered 2011-08-14: 250 mL via INTRAVENOUS

## 2011-08-14 MED ORDER — HYDROCODONE-ACETAMINOPHEN 5-325 MG PO TABS: 1.0000 | ORAL_TABLET | ORAL | Status: AC | PRN

## 2011-08-14 MED ORDER — RIVAROXABAN 10 MG PO TABS: 10.0000 mg | ORAL_TABLET | ORAL | Status: DC

## 2011-08-14 NOTE — Care Management Note (Signed)
    Page 1 of 2   08/14/2011     5:47:36 PM   CARE MANAGEMENT NOTE 08/14/2011  Patient:  Anita Bailey, Anita Bailey   Account Number:  [id number]  Date Initiated:  08/14/2011  Documentation initiated by:  Colleen Can  Subjective/Objective Assessment:   DX TOTAL LEFT KNEE ARTHROPLASTY     Action/Plan:   SNF REHAB   Anticipated DC Date:  08/14/2011   Anticipated DC Plan:  SKILLED NURSING FACILITY  In-house referral  Clinical Social Worker      DC Planning Services  NA      [facility] Choice  NA   Choice offered to / List presented to:  NA   DME arranged  NA      DME agency  NA     HH arranged  NA      HH agency  NA   Status of service:  Completed, signed off Medicare Important Message given?  NA - LOS <3 / Initial given by admissions (If response is "NO", the following Medicare IM given date fields will be blank) Date Medicare IM given:   Date Additional Medicare IM given:    Discharge Disposition:  SKILLED NURSING FACILITY  Per UR Regulation:    If discussed at Long Length of Stay Meetings, dates discussed:    Comments:

## 2011-08-14 NOTE — Progress Notes (Signed)
Physical Therapy Treatment Patient Details Name: Anita Bailey MRN: [id number] DOB: March 22, 1934 Today's Date: 08/14/2011 Time: 5621-3086 PT Time Calculation (min): 26 min  PT Assessment / Plan / Recommendation Comments on Treatment Session  All tasks requiring increased time secondary to pt's lethargy and increased difficulty following commands.  Pt wtih difficulty swallowing noted - RN alerted and in room to observe.    Follow Up Recommendations  Skilled nursing facility    Barriers to Discharge        Equipment Recommendations  Defer to next venue    Recommendations for Other Services    Frequency 7X/week   Plan Discharge plan remains appropriate    Precautions / Restrictions Precautions Precautions: Knee Restrictions Weight Bearing Restrictions: No   Pertinent Vitals/Pain Pt c/o increased pain with there ex but does not rate.  Pt with limited pain meds 2* lethargy.  Cold pack provided    Mobility       Exercises Total Joint Exercises Ankle Circles/Pumps: AROM;Both;20 reps;Supine Quad Sets: AROM;20 reps;Both;Supine Heel Slides: AAROM;20 reps;Left;Supine Straight Leg Raises: 20 reps;Supine;Left;AAROM   PT Diagnosis:    PT Problem List:   PT Treatment Interventions:     PT Goals Acute Rehab PT Goals PT Goal Formulation: With patient Time For Goal Achievement: 08/19/11 Potential to Achieve Goals: Good  Visit Information  Last PT Received On: 08/14/11 Assistance Needed: +1    Subjective Data  Subjective: It hurts more than it has Patient Stated Goal: rehb and then home to resume previous lifestyle with decreased pain   Cognition  Overall Cognitive Status: Appears within functional limits for tasks assessed/performed Arousal/Alertness: Lethargic Orientation Level: Appears intact for tasks assessed Behavior During Session: Lethargic    Balance     End of Session PT - End of Session Activity Tolerance: Patient limited by fatigue;Patient limited by  pain Patient left: in bed;with call bell/phone within reach;with family/visitor present Nurse Communication: Mobility status   GP     Abdulai Blaylock 08/14/2011, 12:56 PM

## 2011-08-14 NOTE — Progress Notes (Signed)
Pt for d/c to SNF [facility]) today. D/C IV. Dressing CDI to L knee. Pt had been noted to be drowsy/sleepy more than the past 2 days possibly from pain meds. MD/PA were made aware of pt. sedation. Will wait for pt to be more coherent & awake before giving more pain meds as stated by MD and to d/c later if more awake as claimed. Pt had a BM today & voids well. Ambulated to BR with increased assist r/t drowsy episodes. D/C instructions & RX given prior to d/c.

## 2011-08-14 NOTE — Progress Notes (Signed)
  Subjective: 3 Days Post-Op Procedure(s) (LRB): TOTAL KNEE ARTHROPLASTY (Left)   Patient reports pain as mild, pain well controlled. Little sleepy confusion this morning. Otherwise no events throughout the night. Ready to be discharged to SNF.  Objective:   VITALS:   Filed Vitals:   08/14/11 0510  BP: 149/79  Pulse: 97  Temp: 99.2 F (37.3 C)  Resp: 16    Neurovascular intact Dorsiflexion/Plantar flexion intact Incision: dressing C/D/I No cellulitis present Compartment soft  LABS  Basename 08/13/11 0405 08/12/11 0415  HGB 8.6* 9.5*  HCT 26.7* 29.6*  WBC 9.6 9.4  PLT 208 215     Basename 08/13/11 0405 08/12/11 0415  NA 138 139  K 4.5 4.7  BUN 22 18  CREATININE 1.02 0.86  GLUCOSE 91 113*     Assessment/Plan: 3 Days Post-Op Procedure(s) (LRB): TOTAL KNEE ARTHROPLASTY (Left)  Decrease Norco dose, d/c Robaxin Up with therapy Discharge to SNF Follow up in 2 weeks at [facility].  Follow-up Information    Follow up with OLIN,Shailen Thielen D in 2 weeks.   Contact information:   [facility] [address], [address] [place] [address] [phone]           Anastasio Auerbach. Meliton Samad   PAC  08/14/2011, 7:54 AM

## 2011-08-14 NOTE — Progress Notes (Signed)
Physical Therapy Treatment Patient Details Name: Anita Bailey MRN: [id number] DOB: 1934-07-30 Today's Date: 08/14/2011 Time: 1191-4782 PT Time Calculation (min): 23 min  PT Assessment / Plan / Recommendation Comments on Treatment Session  All tasks requiring increased time secondary to pt's lethargy and increased difficulty following commands.  Pt wtih difficulty swallowing noted - RN alerted and in room to observe.    Follow Up Recommendations  Skilled nursing facility    Barriers to Discharge        Equipment Recommendations  Defer to next venue    Recommendations for Other Services    Frequency 7X/week   Plan Discharge plan remains appropriate    Precautions / Restrictions Precautions Precautions: Knee Required Braces or Orthoses: Knee Immobilizer - Left Restrictions Weight Bearing Restrictions: No   Pertinent Vitals/Pain     Mobility  Bed Mobility Bed Mobility: Supine to Sit Supine to Sit: 4: Min assist;3: Mod assist Details for Bed Mobility Assistance: cues for sequence and use of UEs to self assist Transfers Transfers: Sit to Stand;Stand to Sit Sit to Stand: 3: Mod assist Stand to Sit: 4: Min assist Details for Transfer Assistance: cues for LE management and use of UEs to self assist Ambulation/Gait Ambulation/Gait Assistance: 4: Min assist Ambulation Distance (Feet): 25 Feet (and 20') Assistive device: Rolling walker Ambulation/Gait Assistance Details: cues for sequence and position from RW Gait Pattern: Step-to pattern;Step-through pattern;Decreased stance time - left    Exercises Total Joint Exercises Ankle Circles/Pumps: AROM;Both;20 reps;Supine Quad Sets: AROM;20 reps;Both;Supine Heel Slides: AAROM;20 reps;Left;Supine Straight Leg Raises: 20 reps;Supine;Left;AAROM   PT Diagnosis:    PT Problem List:   PT Treatment Interventions:     PT Goals Acute Rehab PT Goals PT Goal Formulation: With patient Time For Goal Achievement: 08/19/11 Potential to  Achieve Goals: Good  Visit Information  Last PT Received On: 08/14/11 Assistance Needed: +1    Subjective Data  Subjective: I think I left my head in the bed Patient Stated Goal: rehb and then home to resume previous lifestyle with decreased pain   Cognition  Overall Cognitive Status: Appears within functional limits for tasks assessed/performed Arousal/Alertness: Lethargic Orientation Level: Appears intact for tasks assessed Behavior During Session: Lethargic    Balance     End of Session PT - End of Session Equipment Utilized During Treatment: Left knee immobilizer Activity Tolerance: Patient limited by fatigue;Patient limited by pain Patient left: in chair;with call bell/phone within reach;with nursing in room Nurse Communication: Mobility status   GP     Anita Bailey 08/14/2011, 1:58 PM

## 2011-08-15 NOTE — Progress Notes (Signed)
Clinical Social Work Department CLINICAL SOCIAL WORK PLACEMENT NOTE 08/15/2011  Patient:  MAGDALEN, CABANA  Account Number:  [id number] Admit date:  08/11/2011  Clinical Social Worker:  [name], LCSW  Date/time:  08/12/2011 03:26 PM  Clinical Social Work is seeking post-discharge placement for this patient at the following level of care:   SKILLED NURSING   (*CSW will update this form in Epic as items are completed)     Patient/family provided with [facility] Department of Clinical Social Work's list of facilities offering this level of care within the geographic area requested by the patient (or if unable, by the patient's family).    Patient/family informed of their freedom to choose among providers that offer the needed level of care, that participate in Medicare, Medicaid or managed care program needed by the patient, have an available bed and are willing to accept the patient.    Patient/family informed of [facility]' ownership interest in [facility], as well as of the fact that they are under no obligation to receive care at this facility.  PASARR submitted to EDS on  PASARR number received from EDS on 11/15/2008  FL2 transmitted to all facilities in geographic area requested by pt/family on  08/11/2011 FL2 transmitted to all facilities within larger geographic area on   Patient informed that his/her managed care company has contracts with or will negotiate with  certain facilities, including the following:     Patient/family informed of bed offers received:  08/12/2011 Patient chooses bed at [facility] Physician recommends and patient chooses bed at    Patient to be transferred to [facility] on  08/14/2011 Patient to be transferred to facility by [facility]  The following physician request were entered in Epic:   Additional Comments:  [name] LCSW [phone]

## 2011-11-25 ENCOUNTER — Ambulatory Visit: Payer: Self-pay | Admitting: Unknown Physician Specialty

## 2012-02-09 ENCOUNTER — Ambulatory Visit: Payer: Self-pay | Admitting: Unknown Physician Specialty

## 2012-02-10 LAB — PATHOLOGY REPORT

## 2012-04-05 ENCOUNTER — Ambulatory Visit: Payer: Self-pay | Admitting: Internal Medicine

## 2013-04-28 DIAGNOSIS — C44319 Basal cell carcinoma of skin of other parts of face: Secondary | ICD-10-CM | POA: Insufficient documentation

## 2013-05-20 ENCOUNTER — Emergency Department: Payer: Self-pay | Admitting: Emergency Medicine

## 2013-06-17 DIAGNOSIS — D510 Vitamin B12 deficiency anemia due to intrinsic factor deficiency: Secondary | ICD-10-CM | POA: Insufficient documentation

## 2013-06-17 DIAGNOSIS — I2699 Other pulmonary embolism without acute cor pulmonale: Secondary | ICD-10-CM | POA: Insufficient documentation

## 2013-06-17 DIAGNOSIS — M519 Unspecified thoracic, thoracolumbar and lumbosacral intervertebral disc disorder: Secondary | ICD-10-CM | POA: Insufficient documentation

## 2013-06-17 DIAGNOSIS — M5090 Cervical disc disorder, unspecified, unspecified cervical region: Secondary | ICD-10-CM | POA: Insufficient documentation

## 2013-09-12 ENCOUNTER — Ambulatory Visit: Payer: Self-pay | Admitting: Internal Medicine

## 2013-09-20 ENCOUNTER — Other Ambulatory Visit: Payer: Self-pay | Admitting: *Deleted

## 2013-09-20 DIAGNOSIS — F809 Developmental disorder of speech and language, unspecified: Secondary | ICD-10-CM

## 2013-09-20 DIAGNOSIS — R4789 Other speech disturbances: Secondary | ICD-10-CM

## 2013-09-20 DIAGNOSIS — G3184 Mild cognitive impairment, so stated: Secondary | ICD-10-CM

## 2013-09-21 ENCOUNTER — Other Ambulatory Visit: Payer: Self-pay | Admitting: Psychiatry

## 2013-09-21 DIAGNOSIS — R4789 Other speech disturbances: Secondary | ICD-10-CM

## 2013-09-21 DIAGNOSIS — G3184 Mild cognitive impairment, so stated: Secondary | ICD-10-CM

## 2013-09-25 ENCOUNTER — Other Ambulatory Visit: Payer: PRIVATE HEALTH INSURANCE

## 2013-09-27 ENCOUNTER — Ambulatory Visit
Admission: RE | Admit: 2013-09-27 | Discharge: 2013-09-27 | Disposition: A | Discharge status: Home / Self Care | Payer: Medicare Other | Source: Ambulatory Visit | Attending: Psychiatry | Admitting: Psychiatry

## 2013-09-27 DIAGNOSIS — R4789 Other speech disturbances: Secondary | ICD-10-CM

## 2013-09-27 DIAGNOSIS — G3184 Mild cognitive impairment, so stated: Secondary | ICD-10-CM

## 2013-09-28 ENCOUNTER — Inpatient Hospital Stay: Admission: RE | Admit: 2013-09-28 | Payer: PRIVATE HEALTH INSURANCE | Source: Ambulatory Visit

## 2014-02-13 DIAGNOSIS — Z85820 Personal history of malignant melanoma of skin: Secondary | ICD-10-CM | POA: Insufficient documentation

## 2014-03-07 ENCOUNTER — Ambulatory Visit: Payer: Self-pay | Admitting: Internal Medicine

## 2014-04-03 ENCOUNTER — Emergency Department: Payer: Self-pay | Admitting: Emergency Medicine

## 2014-08-21 ENCOUNTER — Other Ambulatory Visit: Payer: Self-pay | Admitting: Internal Medicine

## 2014-08-21 DIAGNOSIS — Z1231 Encounter for screening mammogram for malignant neoplasm of breast: Secondary | ICD-10-CM

## 2014-09-14 ENCOUNTER — Other Ambulatory Visit: Payer: Self-pay | Admitting: Internal Medicine

## 2014-09-14 ENCOUNTER — Ambulatory Visit
Admission: RE | Admit: 2014-09-14 | Discharge: 2014-09-14 | Disposition: A | Discharge status: Home / Self Care | Payer: Medicare Other | Source: Ambulatory Visit | Attending: Internal Medicine | Admitting: Internal Medicine

## 2014-09-14 DIAGNOSIS — Z1231 Encounter for screening mammogram for malignant neoplasm of breast: Secondary | ICD-10-CM

## 2014-12-02 ENCOUNTER — Emergency Department
Admission: EM | Admit: 2014-12-02 | Discharge: 2014-12-02 | Disposition: A | Discharge status: Home / Self Care | Payer: Medicare Other | Attending: Emergency Medicine | Admitting: Emergency Medicine

## 2014-12-02 ENCOUNTER — Emergency Department: Payer: Medicare Other

## 2014-12-02 ENCOUNTER — Encounter: Payer: Self-pay | Admitting: Emergency Medicine

## 2014-12-02 DIAGNOSIS — Y9289 Other specified places as the place of occurrence of the external cause: Secondary | ICD-10-CM | POA: Diagnosis not present

## 2014-12-02 DIAGNOSIS — S92002A Unspecified fracture of left calcaneus, initial encounter for closed fracture: Secondary | ICD-10-CM

## 2014-12-02 DIAGNOSIS — Z880 Allergy status to penicillin: Secondary | ICD-10-CM | POA: Diagnosis not present

## 2014-12-02 DIAGNOSIS — Y998 Other external cause status: Secondary | ICD-10-CM | POA: Insufficient documentation

## 2014-12-02 DIAGNOSIS — Z79899 Other long term (current) drug therapy: Secondary | ICD-10-CM | POA: Diagnosis not present

## 2014-12-02 DIAGNOSIS — I129 Hypertensive chronic kidney disease with stage 1 through stage 4 chronic kidney disease, or unspecified chronic kidney disease: Secondary | ICD-10-CM | POA: Diagnosis not present

## 2014-12-02 DIAGNOSIS — Y9389 Activity, other specified: Secondary | ICD-10-CM | POA: Insufficient documentation

## 2014-12-02 DIAGNOSIS — N189 Chronic kidney disease, unspecified: Secondary | ICD-10-CM | POA: Diagnosis not present

## 2014-12-02 DIAGNOSIS — S92032A Displaced avulsion fracture of tuberosity of left calcaneus, initial encounter for closed fracture: Secondary | ICD-10-CM | POA: Diagnosis not present

## 2014-12-02 DIAGNOSIS — Z7901 Long term (current) use of anticoagulants: Secondary | ICD-10-CM | POA: Diagnosis not present

## 2014-12-02 DIAGNOSIS — W08XXXA Fall from other furniture, initial encounter: Secondary | ICD-10-CM | POA: Insufficient documentation

## 2014-12-02 DIAGNOSIS — S99922A Unspecified injury of left foot, initial encounter: Secondary | ICD-10-CM | POA: Diagnosis present

## 2014-12-02 NOTE — ED Provider Notes (Signed)
[facility] Emergency Department Provider Note  ____________________________________________  Time seen: Approximately 3:08 PM  I have reviewed the triage vital signs and the nursing notes.   HISTORY  Chief Complaint Foot Pain    HPI Anita Bailey is a 80 y.o. female who presents to the emergency department complaining of left foot pain. She states that she was standing up from her couch when her shoe became stuck underneath same causing her foot to remain immobilized her body before. She is not endorsing swelling and pain to the dorsal aspect of foot. She denies hitting her head and losing consciousness at anytime. She endorses pain with movement and weightbearing. Pain is minimal at rest but increases to severe with weightbearing or movement. She denies any numbness or tingling.   Past Medical History  Diagnosis Date  . Hypertension   . Dysrhythmia     hx of heart racing   . Chronic kidney disease     current uti 08/05/11   . Arthritis   . Pulmonary emboli (HCC)     bilateral 8/12 hospitalized   . Cancer ([place])     hx of melanoma in 1971, basal cell     Patient Active Problem List   Diagnosis Date Noted  . S/P left TKA 08/11/2011    Past Surgical History  Procedure Laterality Date  . Other surgical history      melanoma removed   . Left wrist       surgery x 2   . Abdominal hysterectomy      1981  . Total knee arthroplasty  08/11/2011    Procedure: TOTAL KNEE ARTHROPLASTY;  Surgeon: [name], MD;  Location: [facility]S;  Service: Orthopedics;  Laterality: Left;  . Breast biopsy Right 1979    Negative    Current Outpatient Rx  Name  Route  Sig  Dispense  Refill  . cyproheptadine (PERIACTIN) 4 MG tablet   Oral   Take 4 mg by mouth 3 (three) times daily as needed. For allergies.         [name] EXPIRED: diphenhydrAMINE (BENADRYL) 25 mg capsule   Oral   Take 1 capsule (25 mg total) by mouth every 6 (six) hours as needed for itching,  allergies or sleep.   30 capsule      . EXPIRED: ferrous sulfate 325 (65 FE) MG tablet   Oral   Take 1 tablet (325 mg total) by mouth 3 (three) times daily after meals.         . metoprolol succinate (TOPROL-XL) 50 MG 24 hr tablet   Oral   Take 50 mg by mouth every morning. Take with or immediately following a meal.         . pregabalin (LYRICA) 25 MG capsule   Oral   Take 25 mg by mouth 2 (two) times daily.         . rivaroxaban (XARELTO) 10 MG TABS tablet   Oral   Take 1 tablet (10 mg total) by mouth daily.   12 tablet   0   . venlafaxine (EFFEXOR) 75 MG tablet   Oral   Take 75 mg by mouth every morning.         . zolpidem (AMBIEN) 5 MG tablet   Oral   Take 2.5 mg by mouth at bedtime.           Allergies Penicillins; Clindamycin/lincomycin; and Diclofenac  No family history on file.  Social History Social History  Substance Use Topics  . Smoking status: Never Smoker   . Smokeless tobacco: Never Used  . Alcohol Use: None    Review of Systems Constitutional: No fever/chills Eyes: No visual changes. ENT: No sore throat. Cardiovascular: Denies chest pain. Respiratory: Denies shortness of breath. Gastrointestinal: No abdominal pain.  No nausea, no vomiting.  No diarrhea.  No constipation. Genitourinary: Negative for dysuria. Musculoskeletal: Negative for back pain. Endorses left foot pain. Skin: Negative for rash. Neurological: Negative for headaches, focal weakness or numbness.  10-point ROS otherwise negative.  ____________________________________________   PHYSICAL EXAM:  VITAL SIGNS: ED Triage Vitals  Enc Vitals Group     BP 12/02/14 1434 120/54 mmHg     Pulse Rate 12/02/14 1434 60     Resp 12/02/14 1434 20     Temp 12/02/14 1434 97.4 F (36.3 C)     Temp Source 12/02/14 1434 Oral     SpO2 12/02/14 1434 97 %     Weight 12/02/14 1434 147 lb (66.679 kg)     Height 12/02/14 1434 5\' 5"  (1.651 m)     Head Cir --      Peak Flow --       Pain Score 12/02/14 1436 4     Pain Loc --      Pain Edu? --      Excl. in [place]? --     Constitutional: Alert and oriented. Well appearing and in no acute distress. Eyes: Conjunctivae are normal. PERRL. EOMI. Head: Atraumatic. Nose: No congestion/rhinnorhea. Mouth/Throat: Mucous membranes are moist.  Oropharynx non-erythematous. Neck: No stridor.  No cervical spine tenderness to palpation. Cardiovascular: Normal rate, regular rhythm. Grossly normal heart sounds.  Good peripheral circulation. Respiratory: Normal respiratory effort.  No retractions. Lungs CTAB. Gastrointestinal: Soft and nontender. No distention. No abdominal bruits. No CVA tenderness. Musculoskeletal: Edema noted to proximal dorsal aspect of left foot when compared with right. No visible deformity. Patient has limited range of motion due to pain and left ankle. Patient is experiencing extreme tenderness to palpation over the talonavicular joint line. Pulses and sensation are intact. No palpable abnormality..  No joint effusions. Neurologic:  Normal speech and language. No gross focal neurologic deficits are appreciated. No gait instability. Skin:  Skin is warm, dry and intact. No rash noted. Psychiatric: Mood and affect are normal. Speech and behavior are normal.  ____________________________________________   LABS (all labs ordered are listed, but only abnormal results are displayed)  Labs Reviewed - No data to display ____________________________________________  EKG   ____________________________________________  RADIOLOGY  Left foot x-ray Impression: Lateral mid foot avulsion injury from the distal aspect of the calcaneus. Soft tissue swelling. ____________________________________________   PROCEDURES  Procedure(s) performed: Yes, splint, see procedure note(s).   SPLINT APPLICATION Date/Time: 123XX123 PM Authorized by: Darletta Moll Consent: Verbal consent obtained. Risks and benefits: risks,  benefits and alternatives were discussed Consent given by: patient Splint applied by: orthopedic technician Location details: Left ankle  Splint type: Posterior OCL  Supplies used: Ortho-Glass  Post-procedure: The splinted body part was neurovascularly unchanged following the procedure. Patient tolerance: Patient tolerated the procedure well with no immediate complications.     Critical Care performed: No  ____________________________________________   INITIAL IMPRESSION / ASSESSMENT AND PLAN / ED COURSE  Pertinent labs & imaging results that were available during my care of the patient were reviewed by me and considered in my medical decision making (see chart for details).  Patient's history, symptoms, physical exam are taken into consideration  for diagnosis. Patient has an avulsion fracture arising from the calcaneus to the left foot. I advised patient of findings and diagnosis and she verbalizes understanding. The patient states that she has a recent history of avulsion fracture to the right foot and still has adequate narcotic medications from treatment. She sees Dr. Earnestine Leys and request follow-up with him. The patient is to follow-up with Dr. Sabra Heck for further evaluation and treatment. Patient is splinted here in the emergency department and discharged home. ____________________________________________   FINAL CLINICAL IMPRESSION(S) / ED DIAGNOSES  Final diagnoses:  Avulsion fracture of calcaneus, left, closed, initial encounter      Darletta Moll, PA-C 12/02/14 [place], MD 12/02/14 1928

## 2014-12-02 NOTE — ED Notes (Signed)
Selling noted top of L foot

## 2014-12-02 NOTE — Discharge Instructions (Signed)
Cast or Splint Care Casts and splints support injured limbs and keep bones from moving while they heal.  HOME CARE  Keep the cast or splint uncovered during the drying period.  A plaster cast can take 24 to 48 hours to dry.  A fiberglass cast will dry in less than 1 hour.  Do not rest the cast on anything harder than a pillow for 24 hours.  Do not put weight on your injured limb. Do not put pressure on the cast. Wait for your doctor's approval.  Keep the cast or splint dry.  Cover the cast or splint with a plastic bag during baths or wet weather.  If you have a cast over your chest and belly (trunk), take sponge baths until the cast is taken off.  If your cast gets wet, dry it with a towel or blow dryer. Use the cool setting on the blow dryer.  Keep your cast or splint clean. Wash a dirty cast with a damp cloth.  Do not put any objects under your cast or splint.  Do not scratch the skin under the cast with an object. If itching is a problem, use a blow dryer on a cool setting over the itchy area.  Do not trim or cut your cast.  Do not take out the padding from inside your cast.  Exercise your joints near the cast as told by your doctor.  Raise (elevate) your injured limb on 1 or 2 pillows for the first 1 to 3 days. GET HELP IF:  Your cast or splint cracks.  Your cast or splint is too tight or too loose.  You itch badly under the cast.  Your cast gets wet or has a soft spot.  You have a bad smell coming from the cast.  You get an object stuck under the cast.  Your skin around the cast becomes red or sore.  You have new or more pain after the cast is put on. GET HELP RIGHT AWAY IF:  You have fluid leaking through the cast.  You cannot move your fingers or toes.  Your fingers or toes turn blue or white or are cool, painful, or puffy (swollen).  You have tingling or lose feeling (numbness) around the injured area.  You have bad pain or pressure under the  cast.  You have trouble breathing or have shortness of breath.  You have chest pain.   This information is not intended to replace advice given to you by your health care provider. Make sure you discuss any questions you have with your health care provider.  Avulsion Fracture of the Foot  An avulsion fracture of the foot is when a piece of bone in your foot has been torn away. Bones are connected to other bones by strong bands of connective tissue (ligaments). Muscles are also connected to bones with connective tissue (tendons). Avulsion fractures occur when severe stress on a bone from a ligament or tendon causes a small piece of bone to be pulled away.  Athletes may develop an avulsion fracture of the foot gradually (chronic avulsion fracture). The heel bone and the long bone in the foot that connect to the fifth toe (fifth metatarsal bone) are common areas of avulsion fracture of the foot.  CAUSES  An avulsion fracture of the foot can be caused by a sudden or repetitive twisting of your foot or ankle. It can also occur during a fall from a standing height.  RISK FACTORS  You may  have a higher risk of an avulsion fracture of the foot if you:  Participate in activities during which twisting the ankle or foot are likely, such as:  Dancing.  Track and field.  Walking or hiking on uneven surfaces. Have had diabetes for many years.  Have osteoporosis. SIGNS AND SYMPTOMS  The most common symptom of an avulsion fracture of the foot is intense pain at the time of injury. You may also feel a pop or tearing. The pain continues after the injury. Other signs and symptoms may include:  Swelling.  Bruising.  Pain with movement or weight bearing.  Difficulty walking.  Pain when pressure is applied to the injured area.  Warmth over the injured area. DIAGNOSIS  An avulsion fracture of the foot may be diagnosed by:  History. Your health care provider will ask you what occurred during the time of your  injury and whether you had any pain in the area before your injury.  Physical exam. During the exam, your health care provider may try to move your foot, toes, and ankle to check for pain and level of mobility.  X-ray. This will show if any bones are fractured or out of place.  MRI. This will show your tendons and ligaments. Some avulsion fractures are associated with an injury to a tendon or ligament. TREATMENT  Treatment for an avulsion fracture of the foot depends on the size of the displaced piece of bone and how far it has been pulled out of place. Small avulsion fractures may be treated with rest and support in a cast or brace. Large fragments of bone usually need to be reattached surgically. Treatment of these fractures may also require physical therapy to regain full use of your foot.  Treatments may include:  Rest, ice, compression, and elevations (RICE treatment) as directed by your health care provider.  Medicines that reduce pain and swelling (NSAIDs).  Wearing a splint, elastic wrap, support boot, or cast as directed by your health care provider.  Crutches or a rolling scooter to support your body weight until your foot heals.  Surgery to reattach the bone and tendon or ligament.  Physical therapy. This may last for several months. HOME CARE INSTRUCTIONS  Take medicines only as directed by your health care provider.  Rest your foot until your health care provider says you can resume activity.  Keep your foot raised above the level of your heart when you are sitting or lying down.  Apply ice to the injured area:  Put ice in a plastic bag.  Place a towel between your skin and the bag.  Leave the ice on for 20 minutes, 2-3 times a day. Do not allow your cast or splint to get wet as directed by your health care provider.  Keep all follow-up visits as directed by your health care provider. This is important. SEEK MEDICAL CARE IF:  Your pain gets worse.  You have chills or fever.    Your cast or splint is damaged.  The cast has a bad odor or has stains caused by fluids from the wound. SEEK IMMEDIATE MEDICAL CARE IF:  Your foot is cold, blue, or pale.  You have pain, swelling, redness, or numbness below your cast or splint. This information is not intended to replace advice given to you by your health care provider. Make sure you discuss any questions you have with your health care provider.  Document Released: 07/27/2013 Document Reviewed: 07/27/2013  Elsevier Interactive Patient Education 2016 Elsevier  [place] Released: 05/01/2010 Document Revised: 09/01/2012 Document Reviewed: 07/08/2012 Elsevier Interactive Patient Education [employer].

## 2015-01-14 DIAGNOSIS — I82409 Acute embolism and thrombosis of unspecified deep veins of unspecified lower extremity: Secondary | ICD-10-CM

## 2015-01-14 HISTORY — DX: Acute embolism and thrombosis of unspecified deep veins of unspecified lower extremity: I82.409

## 2015-01-28 ENCOUNTER — Emergency Department: Payer: Medicare Other

## 2015-01-28 ENCOUNTER — Emergency Department
Admission: EM | Admit: 2015-01-28 | Discharge: 2015-01-28 | Disposition: A | Discharge status: Home / Self Care | Payer: Medicare Other | Attending: Emergency Medicine | Admitting: Emergency Medicine

## 2015-01-28 DIAGNOSIS — M79604 Pain in right leg: Secondary | ICD-10-CM | POA: Diagnosis present

## 2015-01-28 DIAGNOSIS — N189 Chronic kidney disease, unspecified: Secondary | ICD-10-CM | POA: Insufficient documentation

## 2015-01-28 DIAGNOSIS — I82411 Acute embolism and thrombosis of right femoral vein: Secondary | ICD-10-CM | POA: Insufficient documentation

## 2015-01-28 DIAGNOSIS — Z79899 Other long term (current) drug therapy: Secondary | ICD-10-CM | POA: Diagnosis not present

## 2015-01-28 DIAGNOSIS — I129 Hypertensive chronic kidney disease with stage 1 through stage 4 chronic kidney disease, or unspecified chronic kidney disease: Secondary | ICD-10-CM | POA: Insufficient documentation

## 2015-01-28 DIAGNOSIS — Z880 Allergy status to penicillin: Secondary | ICD-10-CM | POA: Diagnosis not present

## 2015-01-28 DIAGNOSIS — M79661 Pain in right lower leg: Secondary | ICD-10-CM

## 2015-01-28 DIAGNOSIS — I82401 Acute embolism and thrombosis of unspecified deep veins of right lower extremity: Secondary | ICD-10-CM

## 2015-01-28 DIAGNOSIS — Z91040 Latex allergy status: Secondary | ICD-10-CM | POA: Diagnosis not present

## 2015-01-28 LAB — CBC
HCT: 40.7 % (ref 35.0–47.0)
Hemoglobin: 13.4 g/dL (ref 12.0–16.0)
MCH: 30.5 pg (ref 26.0–34.0)
MCHC: 32.9 g/dL (ref 32.0–36.0)
MCV: 92.7 fL (ref 80.0–100.0)
Platelets: 233 10*3/uL (ref 150–440)
RBC: 4.39 MIL/uL (ref 3.80–5.20)
RDW: 13.4 % (ref 11.5–14.5)
WBC: 5.1 10*3/uL (ref 3.6–11.0)

## 2015-01-28 LAB — COMPREHENSIVE METABOLIC PANEL WITH GFR
ALT: 11 U/L — ABNORMAL LOW (ref 14–54)
AST: 17 U/L (ref 15–41)
Albumin: 4.3 g/dL (ref 3.5–5.0)
Alkaline Phosphatase: 129 U/L — ABNORMAL HIGH (ref 38–126)
Anion gap: 8 (ref 5–15)
BUN: 20 mg/dL (ref 6–20)
CO2: 27 mmol/L (ref 22–32)
Calcium: 9.1 mg/dL (ref 8.9–10.3)
Chloride: 103 mmol/L (ref 101–111)
Creatinine, Ser: 0.9 mg/dL (ref 0.44–1.00)
GFR calc Af Amer: >60 mL/min
GFR calc non Af Amer: 59 mL/min — ABNORMAL LOW
Glucose, Bld: 91 mg/dL (ref 65–99)
Potassium: 4.3 mmol/L (ref 3.5–5.1)
Sodium: 138 mmol/L (ref 135–145)
Total Bilirubin: 0.6 mg/dL (ref 0.3–1.2)
Total Protein: 7 g/dL (ref 6.5–8.1)

## 2015-01-28 MED ORDER — RIVAROXABAN 15 MG PO TABS
15.0000 mg | ORAL_TABLET | Freq: Once | ORAL | Status: AC
  Administered 2015-01-28: 15 mg via ORAL
  Filled 2015-01-28: qty 1

## 2015-01-28 MED ORDER — RIVAROXABAN (XARELTO) VTE STARTER PACK (15 & 20 MG): ORAL_TABLET | ORAL | Status: DC

## 2015-01-28 NOTE — Discharge Instructions (Signed)
Please follow-up your primary care physician for repeat examination, and one 2 weeks. Return to the emergency department for any chest pain or trouble breathing, or any other symptom personally concerning to yourself. Please fill and begin taking your medication tomorrow.   Deep Vein Thrombosis A deep vein thrombosis (DVT) is a blood clot (thrombus) that usually occurs in a deep, larger vein of the lower leg or the pelvis, or in an upper extremity such as the arm. These are dangerous and can lead to serious and even life-threatening complications if the clot travels to the lungs. A DVT can damage the valves in your leg veins so that instead of flowing upward, the blood pools in the lower leg. This is called post-thrombotic syndrome, and it can result in pain, swelling, discoloration, and sores on the leg. CAUSES A DVT is caused by the formation of a blood clot in your leg, pelvis, or arm. Usually, several things contribute to the formation of blood clots. A clot may develop when:  Your blood flow slows down.  Your vein becomes damaged in some way.  You have a condition that makes your blood clot more easily. RISK FACTORS A DVT is more likely to develop in:  People who are older, especially over 72 years of age.  People who are overweight (obese).  People who sit or lie still for a long time, such as during long-distance travel (over 4 hours), bed rest, hospitalization, or during recovery from certain medical conditions like a stroke.  People who do not engage in much physical activity (sedentary lifestyle).  People who have chronic breathing disorders.  People who have a personal or family history of blood clots or blood clotting disease.  People who have peripheral vascular disease (PVD), diabetes, or some types of cancer.  People who have heart disease, especially if the person had a recent heart attack or has congestive heart failure.  People who have neurological diseases that  affect the legs (leg paresis).  People who have had a traumatic injury, such as breaking a hip or leg.  People who have recently had major or lengthy surgery, especially on the hip, knee, or abdomen.  People who have had a central line placed inside a large vein.  People who take medicines that contain the hormone estrogen. These include birth control pills and hormone replacement therapy.  Pregnancy or during childbirth or the postpartum period.  Long plane flights (over 8 hours). SIGNS AND SYMPTOMS Symptoms of a DVT can include:   Swelling of your leg or arm, especially if one side is much worse.  Warmth and redness of your leg or arm, especially if one side is much worse.  Pain in your arm or leg. If the clot is in your leg, symptoms may be more noticeable or worse when you stand or walk.  A feeling of pins and needles, if the clot is in the arm. The symptoms of a DVT that has traveled to the lungs (pulmonary embolism, PE) usually start suddenly and include:  Shortness of breath while active or at rest.  Coughing or coughing up blood or blood-tinged mucus.  Chest pain that is often worse with deep breaths.  Rapid or irregular heartbeat.  Feeling light-headed or dizzy.  Fainting.  Feeling anxious.  Sweating. There may also be pain and swelling in a leg if that is where the blood clot started. These symptoms may represent a serious problem that is an emergency. Do not wait to see if the  symptoms will go away. Get medical help right away. Call your local emergency services (911 in the [place] Do not drive yourself to the hospital. DIAGNOSIS Your health care provider will take a medical history and perform a physical exam. You may also have other tests, including:  Blood tests to assess the clotting properties of your blood.  Imaging tests, such as CT, ultrasound, MRI, X-ray, and other tests to see if you have clots anywhere in your body. TREATMENT After a DVT is  identified, it can be treated. The type of treatment that you receive depends on many factors, such as the cause of your DVT, your risk for bleeding or developing more clots, and other medical conditions that you have. Sometimes, a combination of treatments is necessary. Treatment options may be combined and include:  Monitoring the blood clot with ultrasound.  Taking medicines by mouth, such as newer blood thinners (anticoagulants), thrombolytics, or warfarin.  Taking anticoagulant medicine by injection or through an IV tube.  Wearing compression stockings or using different types ofdevices.  Surgery (rare) to remove the blood clot or to place a filter in your abdomen to stop the blood clot from traveling to your lungs. Treatments for a DVT are often divided into immediate treatment and long-term treatment (up to 3 months after DVT). You can work with your health care provider to choose the treatment program that is best for you. HOME CARE INSTRUCTIONS If you are taking a newer oral anticoagulant:  Take the medicine every single day at the same time each day.  Understand what foods and drugs interact with this medicine.  Understand that there are no regular blood tests required when using this medicine.  Understand the side effects of this medicine, including excessive bruising or bleeding. Ask your health care provider or pharmacist about other possible side effects. If you are taking warfarin:  Understand how to take warfarin and know which foods can affect how warfarin works in Veterinary surgeon.  Understand that it is dangerous to take too much or too little warfarin. Too much warfarin increases the risk of bleeding. Too little warfarin continues to allow the risk for blood clots.  Follow your PT and INR blood testing schedule. The PT and INR results allow your health care provider to adjust your dose of warfarin. It is very important that you have your PT and INR tested as often as told by  your health care provider.  Avoid major changes in your diet, or tell your health care provider before you change your diet. Arrange a visit with a registered dietitian to answer your questions. Many foods, especially foods that are high in vitamin K, can interfere with warfarin and affect the PT and INR results. Eat a consistent amount of foods that are high in vitamin K, such as:  Spinach, kale, broccoli, cabbage, collard greens, turnip greens, Brussels sprouts, peas, cauliflower, seaweed, and parsley.  Beef liver and pork liver.  Green tea.  Soybean oil.  Tell your health care provider about any and all medicines, vitamins, and supplements that you take, including aspirin and other over-the-counter anti-inflammatory medicines. Be especially cautious with aspirin and anti-inflammatory medicines. Do not take those before you ask your health care provider if it is safe to do so. This is important because many medicines can interfere with warfarin and affect the PT and INR results.  Do not start or stop taking any over-the-counter or prescription medicine unless your health care provider or pharmacist tells you to do  so. If you take warfarin, you will also need to do these things:  Hold pressure over cuts for longer than usual.  Tell your dentist and other health care providers that you are taking warfarin before you have any procedures in which bleeding may occur.  Avoid alcohol or drink very small amounts. Tell your health care provider if you change your alcohol intake.  Do not use tobacco products, including cigarettes, chewing tobacco, and e-cigarettes. If you need help quitting, ask your health care provider.  Avoid contact sports. General Instructions  Take over-the-counter and prescription medicines only as told by your health care provider. Anticoagulant medicines can have side effects, including easy bruising and difficulty stopping bleeding. If you are prescribed an  anticoagulant, you will also need to do these things:  Hold pressure over cuts for longer than usual.  Tell your dentist and other health care providers that you are taking anticoagulants before you have any procedures in which bleeding may occur.  Avoid contact sports.  Wear a medical alert bracelet or carry a medical alert card that says you have had a PE.  Ask your health care provider how soon you can go back to your normal activities. Stay active to prevent new blood clots from forming.  Make sure to exercise while traveling or when you have been sitting or standing for a long period of time. It is very important to exercise. Exercise your legs by walking or by tightening and relaxing your leg muscles often. Take frequent walks.  Wear compression stockings as told by your health care provider to help prevent more blood clots from forming.  Do not use tobacco products, including cigarettes, chewing tobacco, and e-cigarettes. If you need help quitting, ask your health care provider.  Keep all follow-up appointments with your health care provider. This is important. PREVENTION Take these actions to decrease your risk of developing another DVT:  Exercise regularly. For at least 30 minutes every day, engage in:  Activity that involves moving your arms and legs.  Activity that encourages good blood flow through your body by increasing your heart rate.  Exercise your arms and legs every hour during long-distance travel (over 4 hours). Drink plenty of water and avoid drinking alcohol while traveling.  Avoid sitting or lying in bed for long periods of time without moving your legs.  Maintain a weight that is appropriate for your height. Ask your health care provider what weight is healthy for you.  If you are a woman who is over 48 years of age, avoid unnecessary use of medicines that contain estrogen. These include birth control pills.  Do not smoke, especially if you take estrogen  medicines. If you need help quitting, ask your health care provider. If you are hospitalized, prevention measures may include:  Early walking after surgery, as soon as your health care provider says that it is safe.  Receiving anticoagulants to prevent blood clots.If you cannot take anticoagulants, other options may be available, such as wearing compression stockings or using different types of devices. SEEK IMMEDIATE MEDICAL CARE IF:  You have new or increased pain, swelling, or redness in an arm or leg.  You have numbness or tingling in an arm or leg.  You have shortness of breath while active or at rest.  You have chest pain.  You have a rapid or irregular heartbeat.  You feel light-headed or dizzy.  You cough up blood.  You notice blood in your vomit, bowel movement, or urine. These  symptoms may represent a serious problem that is an emergency. Do not wait to see if the symptoms will go away. Get medical help right away. Call your local emergency services (911 in the [place] Do not drive yourself to the hospital.   This information is not intended to replace advice given to you by your health care provider. Make sure you discuss any questions you have with your health care provider.   Document Released: 12/30/2004 Document Revised: 09/20/2014 Document Reviewed: 04/26/2014 Elsevier Interactive Patient Education [employer].

## 2015-01-28 NOTE — ED Notes (Signed)
Pt reports right calf pain x 4 weeks, recently patient describes the pain as shooting up her leg now.

## 2015-01-28 NOTE — ED Provider Notes (Signed)
[facility] Emergency Department Provider Note  Time seen: 4:26 PM  I have reviewed the triage vital signs and the nursing notes.   HISTORY  Chief Complaint Leg Pain    HPI Anita Bailey is a 81 y.o. female with a past medical history of hypertension, chronic kidney disease, pulmonary emboli 2012, now off blood thinners who presents to the emergency department with right lower extremity pain for the past 3 weeks, worse in the past few days.  Patient describes the pain as moderate at 5/10 with ambulating, occasional sharp pain which shoot up her leg which she states is severe. At rest the patient denies any discomfort at all. Denies any chest pain or shortness of breath at any time.   Past Medical History  Diagnosis Date  . Hypertension   . Dysrhythmia     hx of heart racing   . Chronic kidney disease     current uti 08/05/11   . Arthritis   . Pulmonary emboli (HCC)     bilateral 8/12 hospitalized   . Cancer ([place])     hx of melanoma in 1971, basal cell     Patient Active Problem List   Diagnosis Date Noted  . S/P left TKA 08/11/2011    Past Surgical History  Procedure Laterality Date  . Other surgical history      melanoma removed   . Left wrist       surgery x 2   . Abdominal hysterectomy      1981  . Total knee arthroplasty  08/11/2011    Procedure: TOTAL KNEE ARTHROPLASTY;  Surgeon: [name], MD;  Location: [facility] ORS;  Service: Orthopedics;  Laterality: Left;  . Breast biopsy Right 1979    Negative    Current Outpatient Rx  Name  Route  Sig  Dispense  Refill  . cyproheptadine (PERIACTIN) 4 MG tablet   Oral   Take 4 mg by mouth 3 (three) times daily as needed. For allergies.         [name] EXPIRED: diphenhydrAMINE (BENADRYL) 25 mg capsule   Oral   Take 1 capsule (25 mg total) by mouth every 6 (six) hours as needed for itching, allergies or sleep.   30 capsule      . EXPIRED: ferrous sulfate 325 (65 FE) MG tablet   Oral   Take 1  tablet (325 mg total) by mouth 3 (three) times daily after meals.         . metoprolol succinate (TOPROL-XL) 50 MG 24 hr tablet   Oral   Take 50 mg by mouth every morning. Take with or immediately following a meal.         . pregabalin (LYRICA) 25 MG capsule   Oral   Take 25 mg by mouth 2 (two) times daily.         . rivaroxaban (XARELTO) 10 MG TABS tablet   Oral   Take 1 tablet (10 mg total) by mouth daily.   12 tablet   0   . venlafaxine (EFFEXOR) 75 MG tablet   Oral   Take 75 mg by mouth every morning.         . zolpidem (AMBIEN) 5 MG tablet   Oral   Take 2.5 mg by mouth at bedtime.           Allergies Penicillins; Caffeine; Sulfa antibiotics; Celebrex; Clindamycin/lincomycin; Diclofenac; Latex; and Macrobid  No family history on file.  Social History Social History  Substance Use Topics  . Smoking status: Never Smoker   . Smokeless tobacco: Never Used  . Alcohol Use: Not on file    Review of Systems Constitutional: Negative for fever Cardiovascular: Negative for chest pain. Respiratory: Negative for shortness of breath. Gastrointestinal: Negative for abdominal pain Musculoskeletal: Positive for right lateral calf/lower leg pain. Neurological: Negative for headache 10-point ROS otherwise negative.  ____________________________________________   PHYSICAL EXAM:  VITAL SIGNS: ED Triage Vitals  Enc Vitals Group     BP 01/28/15 1425 148/67 mmHg     Pulse Rate 01/28/15 1425 56     Resp 01/28/15 1425 18     Temp 01/28/15 1425 97.4 F (36.3 C)     Temp Source 01/28/15 1425 Oral     SpO2 01/28/15 1425 96 %     Weight 01/28/15 1425 150 lb (68.04 kg)     Height 01/28/15 1425 5\' 5"  (1.651 m)     Head Cir --      Peak Flow --      Pain Score 01/28/15 1425 2     Pain Loc --      Pain Edu? --      Excl. in [place]? --    Constitutional: Alert and oriented. Well appearing and in no distress. Eyes: Normal exam ENT   Head: Normocephalic and  atraumatic.   Mouth/Throat: Mucous membranes are moist. Cardiovascular: Normal rate, regular rhythm. No murmurs, rubs, or gallops. Respiratory: Normal respiratory effort without tachypnea nor retractions. Breath sounds are clear Gastrointestinal: Soft and nontender. No distention.  Musculoskeletal: Nontender with normal range of motion in all extremities. Mild tenderness to the right lateral lower leg. Neurovascularly intact. Neurologic:  Normal speech and language. No gross focal neurologic deficits Skin:  Skin is warm, dry and intact.  Psychiatric: Mood and affect are normal. Speech and behavior are normal.   ____________________________________________   RADIOLOGY  Ultrasound consistent with a distal femoral vein clot.  ____________________________________________   INITIAL IMPRESSION / ASSESSMENT AND PLAN / ED COURSE  Pertinent labs & imaging results that were available during my care of the patient were reviewed by me and considered in my medical decision making (see chart for details).  Sound consistent with acute clot of the distal right femoral vein. Discussed with patient given her history of PE in the past we will proceed with Xarelto administration, and have the patient follow up with her PCP in one to 2 weeks for repeat ultrasound. Patient is agreeable to plan. We will check basic labs including LFTs before dosing Xarelto.  Labs are largely within normal limits. We'll dose Xarelto and discharge on the same. Patient is follow up with her primary care physician in one to 2 weeks for recheck/reevaluation. Patient agreeable to plan. Discussed return precautions to which she is agreeable. ____________________________________________   FINAL CLINICAL IMPRESSION(S) / ED DIAGNOSES  Deep venous thrombosis   Harvest Dark, MD 01/28/15 1706

## 2015-04-10 ENCOUNTER — Ambulatory Visit
Admission: RE | Admit: 2015-04-10 | Discharge: 2015-04-10 | Disposition: A | Discharge status: Home / Self Care | Payer: Medicare Other | Source: Ambulatory Visit | Attending: Internal Medicine | Admitting: Internal Medicine

## 2015-04-10 ENCOUNTER — Other Ambulatory Visit: Payer: Self-pay | Admitting: Internal Medicine

## 2015-04-10 DIAGNOSIS — I82411 Acute embolism and thrombosis of right femoral vein: Secondary | ICD-10-CM

## 2015-04-10 DIAGNOSIS — R600 Localized edema: Secondary | ICD-10-CM | POA: Diagnosis not present

## 2015-04-10 DIAGNOSIS — E782 Mixed hyperlipidemia: Secondary | ICD-10-CM | POA: Insufficient documentation

## 2015-04-10 DIAGNOSIS — Z86718 Personal history of other venous thrombosis and embolism: Secondary | ICD-10-CM | POA: Insufficient documentation

## 2015-04-12 ENCOUNTER — Other Ambulatory Visit: Payer: Self-pay | Admitting: Orthopedic Surgery

## 2015-04-12 DIAGNOSIS — Z96651 Presence of right artificial knee joint: Secondary | ICD-10-CM

## 2015-04-12 DIAGNOSIS — Z471 Aftercare following joint replacement surgery: Secondary | ICD-10-CM

## 2015-04-16 ENCOUNTER — Ambulatory Visit: Payer: Medicare Other

## 2015-04-18 ENCOUNTER — Encounter: Admission: RE | Admit: 2015-04-18 | Payer: Medicare Other | Source: Ambulatory Visit

## 2015-04-18 ENCOUNTER — Ambulatory Visit: Admission: RE | Admit: 2015-04-18 | Payer: Medicare Other | Source: Ambulatory Visit

## 2015-04-23 ENCOUNTER — Encounter
Admission: RE | Admit: 2015-04-23 | Discharge: 2015-04-23 | Disposition: A | Discharge status: Home / Self Care | Payer: Medicare Other | Source: Ambulatory Visit | Attending: Orthopedic Surgery | Admitting: Orthopedic Surgery

## 2015-04-23 ENCOUNTER — Encounter: Admission: RE | Admit: 2015-04-23 | Payer: Medicare Other | Source: Ambulatory Visit

## 2015-04-23 DIAGNOSIS — Z96651 Presence of right artificial knee joint: Secondary | ICD-10-CM | POA: Diagnosis present

## 2015-04-23 DIAGNOSIS — Z471 Aftercare following joint replacement surgery: Secondary | ICD-10-CM | POA: Diagnosis present

## 2015-04-23 MED ORDER — TECHNETIUM TC 99M MEDRONATE IV KIT
25.0000 | PACK | Freq: Once | INTRAVENOUS | Status: AC | PRN
  Administered 2015-04-23: 25 via INTRAVENOUS

## 2015-04-24 MED ORDER — TECHNETIUM TC 99M MEDRONATE IV KIT
25.0000 | PACK | Freq: Once | INTRAVENOUS | Status: AC | PRN
  Administered 2015-04-24: 23.7 via INTRAVENOUS

## 2015-06-28 ENCOUNTER — Encounter (HOSPITAL_COMMUNITY): Payer: Self-pay | Admitting: *Deleted

## 2015-06-28 ENCOUNTER — Other Ambulatory Visit: Payer: Self-pay | Admitting: Orthopedic Surgery

## 2015-06-28 NOTE — Progress Notes (Signed)
Anita Bailey asked that her daughter Anita Bailey answer questions regarding medical history.  Patient was in the room the majority of the interview to answer questions.  Patient does not see a cardiologist, denies chest pain.  Had a stress > 7 years ago, patient unsure where.  Patient has "fast heart" beat in past and was started on Metoprolol and fast heart rate is not a problem now.   Patient has a history of Pulmonary Embolism and DVT - DVT was January 20176.  Patient was on Eliquis until 06/13/15 of this year.   PCP is Dr Sabra Heck at [facility].  Patient was seen by a NP?PA 6/14 for surgical clearance. I requested notes from that appointment.

## 2015-06-30 ENCOUNTER — Ambulatory Visit (HOSPITAL_COMMUNITY): Payer: Medicare Other | Admitting: Anesthesiology

## 2015-06-30 ENCOUNTER — Encounter (HOSPITAL_COMMUNITY): Payer: Self-pay | Admitting: *Deleted

## 2015-06-30 ENCOUNTER — Encounter (HOSPITAL_COMMUNITY)
Admission: RE | Disposition: A | Discharge status: Home / Self Care | Payer: Self-pay | Source: Ambulatory Visit | Attending: Orthopedic Surgery

## 2015-06-30 ENCOUNTER — Observation Stay (HOSPITAL_COMMUNITY)
Admission: RE | Admit: 2015-06-30 | Discharge: 2015-07-01 | Disposition: A | Discharge status: Home / Self Care | Payer: Medicare Other | Source: Ambulatory Visit | Attending: Orthopedic Surgery | Admitting: Orthopedic Surgery

## 2015-06-30 DIAGNOSIS — X58XXXA Exposure to other specified factors, initial encounter: Secondary | ICD-10-CM | POA: Diagnosis not present

## 2015-06-30 DIAGNOSIS — M1990 Unspecified osteoarthritis, unspecified site: Secondary | ICD-10-CM | POA: Insufficient documentation

## 2015-06-30 DIAGNOSIS — Z96653 Presence of artificial knee joint, bilateral: Secondary | ICD-10-CM | POA: Diagnosis not present

## 2015-06-30 DIAGNOSIS — N189 Chronic kidney disease, unspecified: Secondary | ICD-10-CM | POA: Diagnosis not present

## 2015-06-30 DIAGNOSIS — S52551A Other extraarticular fracture of lower end of right radius, initial encounter for closed fracture: Principal | ICD-10-CM | POA: Insufficient documentation

## 2015-06-30 DIAGNOSIS — Z86718 Personal history of other venous thrombosis and embolism: Secondary | ICD-10-CM | POA: Diagnosis not present

## 2015-06-30 DIAGNOSIS — Z86711 Personal history of pulmonary embolism: Secondary | ICD-10-CM | POA: Insufficient documentation

## 2015-06-30 DIAGNOSIS — S52501A Unspecified fracture of the lower end of right radius, initial encounter for closed fracture: Secondary | ICD-10-CM | POA: Diagnosis present

## 2015-06-30 DIAGNOSIS — Z880 Allergy status to penicillin: Secondary | ICD-10-CM | POA: Insufficient documentation

## 2015-06-30 DIAGNOSIS — I129 Hypertensive chronic kidney disease with stage 1 through stage 4 chronic kidney disease, or unspecified chronic kidney disease: Secondary | ICD-10-CM | POA: Diagnosis not present

## 2015-06-30 DIAGNOSIS — I10 Essential (primary) hypertension: Secondary | ICD-10-CM | POA: Insufficient documentation

## 2015-06-30 DIAGNOSIS — Z85820 Personal history of malignant melanoma of skin: Secondary | ICD-10-CM | POA: Insufficient documentation

## 2015-06-30 HISTORY — PX: OPEN REDUCTION INTERNAL FIXATION (ORIF) DISTAL RADIAL FRACTURE: SHX5989

## 2015-06-30 HISTORY — DX: Acute embolism and thrombosis of unspecified deep veins of unspecified lower extremity: I82.409

## 2015-06-30 HISTORY — DX: Major depressive disorder, single episode, unspecified: F32.9

## 2015-06-30 HISTORY — DX: Personal history of urinary (tract) infections: Z87.440

## 2015-06-30 HISTORY — DX: Depression, unspecified: F32.A

## 2015-06-30 LAB — CBC WITH DIFFERENTIAL/PLATELET
BASOS ABS: 0 10*3/uL (ref 0.0–0.1)
Basophils Relative: 0 %
EOS PCT: 0 %
Eosinophils Absolute: 0 10*3/uL (ref 0.0–0.7)
HEMATOCRIT: 40.3 % (ref 36.0–46.0)
HEMOGLOBIN: 12.5 g/dL (ref 12.0–15.0)
LYMPHS ABS: 0.7 10*3/uL (ref 0.7–4.0)
LYMPHS PCT: 10 %
MCH: 29.5 pg (ref 26.0–34.0)
MCHC: 31 g/dL (ref 30.0–36.0)
MCV: 95 fL (ref 78.0–100.0)
Monocytes Absolute: 0.1 10*3/uL (ref 0.1–1.0)
Monocytes Relative: 1 %
NEUTROS ABS: 6.3 10*3/uL (ref 1.7–7.7)
NEUTROS PCT: 89 %
PLATELETS: 260 10*3/uL (ref 150–400)
RBC: 4.24 MIL/uL (ref 3.87–5.11)
RDW: 13.2 % (ref 11.5–15.5)
WBC: 7.2 10*3/uL (ref 4.0–10.5)

## 2015-06-30 LAB — COMPREHENSIVE METABOLIC PANEL
ALK PHOS: 117 U/L (ref 38–126)
ALT: 13 U/L — AB (ref 14–54)
AST: 18 U/L (ref 15–41)
Albumin: 3.5 g/dL (ref 3.5–5.0)
Anion gap: 4 — ABNORMAL LOW (ref 5–15)
BUN: 17 mg/dL (ref 6–20)
CALCIUM: 9 mg/dL (ref 8.9–10.3)
CHLORIDE: 106 mmol/L (ref 101–111)
CO2: 28 mmol/L (ref 22–32)
CREATININE: 0.84 mg/dL (ref 0.44–1.00)
Glucose, Bld: 140 mg/dL — ABNORMAL HIGH (ref 65–99)
Potassium: 5 mmol/L (ref 3.5–5.1)
Sodium: 138 mmol/L (ref 135–145)
Total Bilirubin: 0.4 mg/dL (ref 0.3–1.2)
Total Protein: 6.4 g/dL — ABNORMAL LOW (ref 6.5–8.1)

## 2015-06-30 SURGERY — OPEN REDUCTION INTERNAL FIXATION (ORIF) DISTAL RADIUS FRACTURE
Anesthesia: Monitor Anesthesia Care | Laterality: Right

## 2015-06-30 MED ORDER — ONDANSETRON HCL 4 MG/2ML IJ SOLN: 4.0000 mg | Freq: Four times a day (QID) | INTRAMUSCULAR | Status: DC | PRN

## 2015-06-30 MED ORDER — VANCOMYCIN HCL IN DEXTROSE 1-5 GM/200ML-% IV SOLN
1000.0000 mg | INTRAVENOUS | Status: AC
  Administered 2015-06-30: 1000 mg via INTRAVENOUS
  Filled 2015-06-30: qty 200

## 2015-06-30 MED ORDER — ONDANSETRON HCL 4 MG/2ML IJ SOLN
INTRAMUSCULAR | Status: AC
  Filled 2015-06-30: qty 2

## 2015-06-30 MED ORDER — METOPROLOL SUCCINATE ER 50 MG PO TB24
50.0000 mg | ORAL_TABLET | Freq: Two times a day (BID) | ORAL | Status: DC
  Administered 2015-06-30 – 2015-07-01 (×2): 50 mg via ORAL
  Filled 2015-06-30 (×2): qty 1

## 2015-06-30 MED ORDER — SENNA 8.6 MG PO TABS
1.0000 | ORAL_TABLET | Freq: Two times a day (BID) | ORAL | Status: DC
  Administered 2015-06-30 – 2015-07-01 (×2): 8.6 mg via ORAL
  Filled 2015-06-30 (×2): qty 1

## 2015-06-30 MED ORDER — LACTATED RINGERS IV SOLN: INTRAVENOUS | Status: DC

## 2015-06-30 MED ORDER — MORPHINE SULFATE (PF) 2 MG/ML IV SOLN: 1.0000 mg | INTRAVENOUS | Status: DC | PRN

## 2015-06-30 MED ORDER — PROPOFOL 10 MG/ML IV BOLUS
INTRAVENOUS | Status: AC
  Filled 2015-06-30: qty 20

## 2015-06-30 MED ORDER — ZOLPIDEM TARTRATE 5 MG PO TABS
5.0000 mg | ORAL_TABLET | Freq: Once | ORAL | Status: AC
  Administered 2015-06-30: 5 mg via ORAL
  Filled 2015-06-30: qty 1

## 2015-06-30 MED ORDER — CHLORHEXIDINE GLUCONATE 4 % EX LIQD: 60.0000 mL | Freq: Once | CUTANEOUS | Status: DC

## 2015-06-30 MED ORDER — FENTANYL CITRATE (PF) 100 MCG/2ML IJ SOLN
50.0000 ug | Freq: Once | INTRAMUSCULAR | Status: AC
  Administered 2015-06-30: 50 ug via INTRAVENOUS

## 2015-06-30 MED ORDER — LACTATED RINGERS IV SOLN
INTRAVENOUS | Status: DC
  Administered 2015-06-30: 09:00:00 via INTRAVENOUS

## 2015-06-30 MED ORDER — ARTIFICIAL TEARS OP OINT
TOPICAL_OINTMENT | OPHTHALMIC | Status: AC
  Filled 2015-06-30: qty 3.5

## 2015-06-30 MED ORDER — 0.9 % SODIUM CHLORIDE (POUR BTL) OPTIME
TOPICAL | Status: DC | PRN
  Administered 2015-06-30: 1000 mL

## 2015-06-30 MED ORDER — FENTANYL CITRATE (PF) 100 MCG/2ML IJ SOLN
INTRAMUSCULAR | Status: AC
  Administered 2015-06-30: 50 ug via INTRAVENOUS
  Filled 2015-06-30: qty 2

## 2015-06-30 MED ORDER — PROPOFOL 500 MG/50ML IV EMUL
INTRAVENOUS | Status: DC | PRN
  Administered 2015-06-30: 25 ug/kg/min via INTRAVENOUS

## 2015-06-30 MED ORDER — VANCOMYCIN HCL 500 MG IV SOLR
500.0000 mg | Freq: Two times a day (BID) | INTRAVENOUS | Status: AC
  Administered 2015-06-30 – 2015-07-01 (×2): 500 mg via INTRAVENOUS
  Filled 2015-06-30 (×2): qty 500

## 2015-06-30 MED ORDER — PROPOFOL 1000 MG/100ML IV EMUL
INTRAVENOUS | Status: AC
  Filled 2015-06-30: qty 100

## 2015-06-30 MED ORDER — ROCURONIUM BROMIDE 50 MG/5ML IV SOLN
INTRAVENOUS | Status: AC
  Filled 2015-06-30: qty 1

## 2015-06-30 MED ORDER — OXYCODONE HCL 5 MG/5ML PO SOLN: 5.0000 mg | Freq: Once | ORAL | Status: DC | PRN

## 2015-06-30 MED ORDER — ONDANSETRON HCL 4 MG PO TABS: 4.0000 mg | ORAL_TABLET | Freq: Four times a day (QID) | ORAL | Status: DC | PRN

## 2015-06-30 MED ORDER — OXYCODONE HCL 5 MG PO TABS: 5.0000 mg | ORAL_TABLET | Freq: Once | ORAL | Status: DC | PRN

## 2015-06-30 MED ORDER — MIDAZOLAM HCL 2 MG/2ML IJ SOLN
INTRAMUSCULAR | Status: AC
  Filled 2015-06-30: qty 2

## 2015-06-30 MED ORDER — FENTANYL CITRATE (PF) 100 MCG/2ML IJ SOLN
INTRAMUSCULAR | Status: DC | PRN
  Administered 2015-06-30 (×2): 25 ug via INTRAVENOUS

## 2015-06-30 MED ORDER — BUPIVACAINE-EPINEPHRINE (PF) 0.5% -1:200000 IJ SOLN
INTRAMUSCULAR | Status: DC | PRN
  Administered 2015-06-30: 25 mL via PERINEURAL

## 2015-06-30 MED ORDER — BUPIVACAINE HCL (PF) 0.25 % IJ SOLN
INTRAMUSCULAR | Status: AC
  Filled 2015-06-30: qty 30

## 2015-06-30 MED ORDER — ENOXAPARIN SODIUM 40 MG/0.4ML ~~LOC~~ SOLN
40.0000 mg | SUBCUTANEOUS | Status: DC
  Administered 2015-07-01: 40 mg via SUBCUTANEOUS
  Filled 2015-06-30: qty 0.4

## 2015-06-30 MED ORDER — FUROSEMIDE 20 MG PO TABS: 20.0000 mg | ORAL_TABLET | Freq: Every day | ORAL | Status: DC | PRN

## 2015-06-30 MED ORDER — ACETAMINOPHEN 160 MG/5ML PO SOLN
325.0000 mg | ORAL | Status: DC | PRN
  Filled 2015-06-30: qty 20.3

## 2015-06-30 MED ORDER — ACETAMINOPHEN 325 MG PO TABS: 325.0000 mg | ORAL_TABLET | ORAL | Status: DC | PRN

## 2015-06-30 MED ORDER — LACTATED RINGERS IV SOLN
INTRAVENOUS | Status: DC | PRN
  Administered 2015-06-30: 09:00:00 via INTRAVENOUS

## 2015-06-30 MED ORDER — VENLAFAXINE HCL ER 75 MG PO CP24
75.0000 mg | ORAL_CAPSULE | Freq: Every day | ORAL | Status: DC
  Administered 2015-07-01: 75 mg via ORAL
  Filled 2015-06-30: qty 1

## 2015-06-30 MED ORDER — LIDOCAINE 2% (20 MG/ML) 5 ML SYRINGE
INTRAMUSCULAR | Status: AC
  Filled 2015-06-30: qty 5

## 2015-06-30 MED ORDER — FENTANYL CITRATE (PF) 100 MCG/2ML IJ SOLN: 25.0000 ug | INTRAMUSCULAR | Status: DC | PRN

## 2015-06-30 MED ORDER — METHOCARBAMOL 500 MG PO TABS
500.0000 mg | ORAL_TABLET | Freq: Four times a day (QID) | ORAL | Status: DC | PRN
Start: 2015-06-30 — End: 2015-07-01
  Administered 2015-06-30 – 2015-07-01 (×2): 500 mg via ORAL
  Filled 2015-06-30 (×2): qty 1

## 2015-06-30 MED ORDER — METHOCARBAMOL 1000 MG/10ML IJ SOLN: 500.0000 mg | Freq: Four times a day (QID) | INTRAMUSCULAR | Status: DC | PRN

## 2015-06-30 MED ORDER — ACETAMINOPHEN 500 MG PO TABS: 1000.0000 mg | ORAL_TABLET | Freq: Four times a day (QID) | ORAL | Status: DC | PRN

## 2015-06-30 MED ORDER — FENTANYL CITRATE (PF) 250 MCG/5ML IJ SOLN
INTRAMUSCULAR | Status: AC
  Filled 2015-06-30: qty 5

## 2015-06-30 MED ORDER — PREGABALIN 50 MG PO CAPS
50.0000 mg | ORAL_CAPSULE | Freq: Two times a day (BID) | ORAL | Status: DC
  Administered 2015-06-30 – 2015-07-01 (×2): 50 mg via ORAL
  Filled 2015-06-30 (×2): qty 1

## 2015-06-30 MED ORDER — OXYCODONE HCL 5 MG PO TABS
5.0000 mg | ORAL_TABLET | ORAL | Status: DC | PRN
  Administered 2015-06-30 – 2015-07-01 (×3): 5 mg via ORAL
  Filled 2015-06-30 (×2): qty 1
  Filled 2015-06-30: qty 2

## 2015-06-30 SURGICAL SUPPLY — 59 items
BANDAGE ACE 4X5 VEL STRL LF (GAUZE/BANDAGES/DRESSINGS) ×2 IMPLANT
BIT DRILL 2.2 SS TIBIAL (BIT) ×2 IMPLANT
BNDG CMPR 9X4 STRL LF SNTH (GAUZE/BANDAGES/DRESSINGS) ×1
BNDG ELASTIC 2X5.8 VLCR STR LF (GAUZE/BANDAGES/DRESSINGS) ×2 IMPLANT
BNDG ESMARK 4X9 LF (GAUZE/BANDAGES/DRESSINGS) ×2 IMPLANT
BNDG GAUZE ELAST 4 BULKY (GAUZE/BANDAGES/DRESSINGS) ×2 IMPLANT
CORDS BIPOLAR (ELECTRODE) ×2 IMPLANT
COVER SURGICAL LIGHT HANDLE (MISCELLANEOUS) ×2 IMPLANT
CUFF TOURNIQUET SINGLE 18IN (TOURNIQUET CUFF) ×2 IMPLANT
DRAPE OEC MINIVIEW 54X84 (DRAPES) ×2 IMPLANT
DRAPE U-SHAPE 47X51 STRL (DRAPES) ×2 IMPLANT
DRSG ADAPTIC 3X8 NADH LF (GAUZE/BANDAGES/DRESSINGS) ×2 IMPLANT
GAUZE SPONGE 4X4 12PLY STRL (GAUZE/BANDAGES/DRESSINGS) ×2 IMPLANT
GAUZE XEROFORM 5X9 LF (GAUZE/BANDAGES/DRESSINGS) ×2 IMPLANT
GLOVE BIOGEL PI IND STRL 7.5 (GLOVE) ×1 IMPLANT
GLOVE BIOGEL PI INDICATOR 7.5 (GLOVE) ×1
GLOVE SURG SS PI 8.0 STRL IVOR (GLOVE) ×4 IMPLANT
GOWN STRL REUS W/ TWL LRG LVL3 (GOWN DISPOSABLE) ×3 IMPLANT
GOWN STRL REUS W/ TWL XL LVL3 (GOWN DISPOSABLE) IMPLANT
GOWN STRL REUS W/TWL LRG LVL3 (GOWN DISPOSABLE) ×6
GOWN STRL REUS W/TWL XL LVL3 (GOWN DISPOSABLE)
KIT BASIN OR (CUSTOM PROCEDURE TRAY) ×2 IMPLANT
KIT ROOM TURNOVER OR (KITS) ×2 IMPLANT
MANIFOLD NEPTUNE II (INSTRUMENTS) ×2 IMPLANT
NEEDLE 22X1 1/2 (OR ONLY) (NEEDLE) IMPLANT
NS IRRIG 1000ML POUR BTL (IV SOLUTION) ×2 IMPLANT
PACK ORTHO EXTREMITY (CUSTOM PROCEDURE TRAY) ×2 IMPLANT
PAD ARMBOARD 7.5X6 YLW CONV (MISCELLANEOUS) ×4 IMPLANT
PAD CAST 3X4 CTTN HI CHSV (CAST SUPPLIES) ×1 IMPLANT
PAD CAST 4YDX4 CTTN HI CHSV (CAST SUPPLIES) ×1 IMPLANT
PADDING CAST COTTON 3X4 STRL (CAST SUPPLIES) ×2
PADDING CAST COTTON 4X4 STRL (CAST SUPPLIES) ×2
PEG LOCKING SMOOTH 2.2X18 (Peg) ×2 IMPLANT
PEG LOCKING SMOOTH 2.2X20 (Screw) ×6 IMPLANT
PEG LOCKING SMOOTH 2.2X22 (Screw) ×2 IMPLANT
PLATE NARROW DVR RIGHT (Plate) ×2 IMPLANT
SCREW LOCK 14X2.7X 3 LD TPR (Screw) ×2 IMPLANT
SCREW LOCK 16X2.7X 3 LD TPR (Screw) ×1 IMPLANT
SCREW LOCK 20X2.7X 3 LD TPR (Screw) ×1 IMPLANT
SCREW LOCKING 2.7X13MM (Screw) ×2 IMPLANT
SCREW LOCKING 2.7X14 (Screw) ×4 IMPLANT
SCREW LOCKING 2.7X16 (Screw) ×2 IMPLANT
SCREW LOCKING 2.7X20MM (Screw) ×2 IMPLANT
SPLINT FIBERGLASS 3X35 (CAST SUPPLIES) ×2 IMPLANT
SPLINT FIBERGLASS 4X30 (CAST SUPPLIES) ×2 IMPLANT
SPONGE GAUZE 4X4 12PLY STER LF (GAUZE/BANDAGES/DRESSINGS) ×2 IMPLANT
SPONGE LAP 4X18 X RAY DECT (DISPOSABLE) IMPLANT
SUT MNCRL AB 4-0 PS2 18 (SUTURE) ×2 IMPLANT
SUT PROLENE 3 0 PS 2 (SUTURE) ×2 IMPLANT
SUT PROLENE 4 0 PS 2 18 (SUTURE) ×4 IMPLANT
SUT VIC AB 3-0 FS2 27 (SUTURE) ×2 IMPLANT
SYR CONTROL 10ML LL (SYRINGE) IMPLANT
SYSTEM CHEST DRAIN TLS 7FR (DRAIN) ×2 IMPLANT
TOWEL OR 17X24 6PK STRL BLUE (TOWEL DISPOSABLE) ×2 IMPLANT
TOWEL OR 17X26 10 PK STRL BLUE (TOWEL DISPOSABLE) ×2 IMPLANT
TUBE CONNECTING 12X1/4 (SUCTIONS) ×2 IMPLANT
TUBE EVACUATION TLS (MISCELLANEOUS) ×2 IMPLANT
UNDERPAD 30X30 INCONTINENT (UNDERPADS AND DIAPERS) ×2 IMPLANT
WATER STERILE IRR 1000ML POUR (IV SOLUTION) ×2 IMPLANT

## 2015-06-30 NOTE — Progress Notes (Signed)
Cane left in short stay sent to PACU

## 2015-06-30 NOTE — Progress Notes (Signed)
Per verbal order by Dr. Ermalene Postin cancel labs.

## 2015-06-30 NOTE — Progress Notes (Signed)
Orthopedic Tech Progress Note Patient Details:  Anita Bailey 1934/07/08 [id number]  Ortho Devices Type of Ortho Device: Arm sling Ortho Device/Splint Interventions: Application   [place] Pink 06/30/2015, 1:00 PM

## 2015-06-30 NOTE — Transfer of Care (Signed)
Immediate Anesthesia Transfer of Care Note  Patient: Anita Bailey  Procedure(s) Performed: Procedure(s): OPEN REDUCTION INTERNAL FIXATION (ORIF) RIGHT DISTAL RADIUS FRACTURE  (Right)  Patient Location: PACU  Anesthesia Type:MAC combined with regional for post-op pain  Level of Consciousness: awake, alert  and oriented  Airway & Oxygen Therapy: Patient Spontanous Breathing  Post-op Assessment: Report given to RN and Post -op Vital signs reviewed and stable  Post vital signs: Reviewed and stable  Last Vitals:  Filed Vitals:   06/30/15 1000 06/30/15 1001  BP:  121/62  Pulse: 58 62  Temp:    Resp: 23 26    Last Pain: There were no vitals filed for this visit.    Patients Stated Pain Goal: 5 ([id number])  Complications: No apparent anesthesia complications

## 2015-06-30 NOTE — Progress Notes (Signed)
Pharmacy Antibiotic Note  Anita Bailey is a 81 y.o. female admitted on 06/30/2015 for a planned R-radius ORIF. Pharmacy now consulted to dose Vancomycin post-op for surgical prophylaxis for 24 hours  The patient received a dose of Vancomycin pre-op at 0830. SCr 0.84, CrCl~40-50 ml/min.   Plan: 1. Vancomycin 500 mg IV every 12 hours for 2 doses 2. Pharmacy will sign off as no additional doses needed at this time.   Height: 5\' 5"  (165.1 cm) Weight: 150 lb (68.04 kg) IBW/kg (Calculated) : 57  Temp (24hrs), Avg:97.6 F (36.4 C), Min:97.2 F (36.2 C), Max:98 F (36.7 C)   Recent Labs Lab 06/30/15 1336  WBC 7.2  CREATININE 0.84    Estimated Creatinine Clearance: 48.9 mL/min (by C-G formula based on Cr of 0.84).    Allergies  Allergen Reactions  . Penicillins Anaphylaxis and Other (See Comments)    Has patient had a PCN reaction causing immediate rash, facial/tongue/throat swelling, SOB or lightheadedness with hypotension: yes Has patient had a PCN reaction causing severe rash involving mucus membranes or skin necrosis no Has patient had a PCN reaction that required hospitalization no Has patient had a PCN reaction occurring within the last 10 years: no If all of the above answers are "NO", then may proceed with Cephalosporin use.   . Caffeine Swelling  . Monosodium Glutamate Other (See Comments)    Migraine headache  . Sulfa Antibiotics Other (See Comments)    "pass out"  . Celebrex [Celecoxib] Rash  . Clindamycin/Lincomycin Rash  . Diclofenac Rash  . Latex Rash  . Macrobid [Nitrofurantoin Monohyd Macro] Rash    Thank you for allowing pharmacy to be a part of this patient's care.  Alycia Rossetti, PharmD, BCPS Clinical Pharmacist Pager: [phone] 06/30/2015 7:55 PM

## 2015-06-30 NOTE — Anesthesia Postprocedure Evaluation (Signed)
Anesthesia Post Note  Patient: Anita Bailey  Procedure(s) Performed: Procedure(s) (LRB): OPEN REDUCTION INTERNAL FIXATION (ORIF) RIGHT DISTAL RADIUS FRACTURE  (Right)  Patient location during evaluation: PACU Anesthesia Type: Regional and MAC Level of consciousness: awake Pain management: pain level controlled Vital Signs Assessment: post-procedure vital signs reviewed and stable Respiratory status: spontaneous breathing Cardiovascular status: stable Postop Assessment: no signs of nausea or vomiting Anesthetic complications: no    Last Vitals:  Filed Vitals:   06/30/15 1235 06/30/15 1345  BP: 116/77 134/57  Pulse: 57 55  Temp: 36.4 C 36.2 C  Resp: 18     Last Pain:  Filed Vitals:   06/30/15 1607  PainSc: 0-No pain                 Milda Lindvall

## 2015-06-30 NOTE — Op Note (Signed)
NAME:  Anita Bailey, Anita Bailey NO.:  [id number]  MEDICAL RECORD NO.:  [id number]  LOCATION:  [facility]                         FACILITY:  [facility]  PHYSICIAN:  Satira Anis. Tariya Morrissette, M.D.DATE OF BIRTH:  10-04-1934  DATE OF PROCEDURE: DATE OF DISCHARGE:                              OPERATIVE REPORT   PREOPERATIVE DIAGNOSIS:  Comminuted complex greater than 3-part fracture distal radius, right upper extremity.  POSTOPERATIVE DIAGNOSIS:  Comminuted complex greater than 3-part fracture distal radius, right upper extremity.  PROCEDURE: 1. Open reduction and internal fixation of greater than 3 part distal     radius fracture with plate and screw construct.  This was a     Crosslock DVR plate and screw construct about the right radius. 2. AP, lateral and oblique x-rays performed, examined and interpreted     by myself.  SURGEON:  Satira Anis. Amedeo Plenty, M.D.  ASSIST:  Avelina Laine, PA-C.  ANESTHESIA:  Block anesthetic with very light IV sedation.  TOURNIQUET TIME:  Less than 45 minutes.  DRAINS:  One.  INDICATIONS:  A a 81 year old female with displaced fracture.  She understands risks and benefits and desires to proceed.  She has been cleared by Dr. Emily Filbert and associates preoperatively.  We discussed blood thinning issues etc.  She has had a history of DVT.  We are going to try to limit any general anesthesia and mobilize her quickly.  OPERATIVE PROCEDURE:  The patient was seen by myself and Anesthesia, taken to operative suite, block was in good working fashion.  She had Hibiclens pre-scrub followed by Betadine scrub and paint followed by elevation of the arm.  Tourniquet was insufflated.  Time-out called. Volar radial incision was made.  Dissection was carried down, the FCR tendon sheath was incised dorsally and palmarly, followed by retraction of the carpal canal contents ulnarly.  Pronator was incised, fracture was accessed.  Following this, reduction was accomplished,  held and a narrow DVR standard plate and screw construct was placed under standard AO technique.  We were able to achieve excellent radial height, inclination and volar tilt without difficulty and there were no complicating features.  Radiocarpal joint, midcarpal joint and distal radial ulnar joint were all stress tested and looked well.  AP, lateral and oblique x-rays performed, examined and interpreted by myself looked to be excellent.  I was pleased with this.  Following this, copious irrigation was applied.  Pronator was closed with Vicryl, TLS drain placed, wound closed with Prolene, short-arm splint was applied without difficulty.  The patient tolerated this well. There were no complicating features. She will be admitted overnight for IV antibiotics.  We will plan for Lovenox injections on Sunday according to Dr. Ammie Ferrier office and Monday, she will call to continue the anticoagulation regimen according to his preference.  We discussed all issues with him at length and all questions have been encouraged and answered.     Satira Anis. Amedeo Plenty, M.D.     [facility]  D:  06/30/2015  T:  06/30/2015  Job:  [id number]  cc:   Emily Filbert, MD

## 2015-06-30 NOTE — Anesthesia Procedure Notes (Addendum)
Procedure Name: MAC Performed by: Suzy Bouchard Pre-anesthesia Checklist: Patient identified, Timeout performed, Emergency Drugs available, Suction available and Patient being monitored Patient Re-evaluated:Patient Re-evaluated prior to inductionOxygen Delivery Method: Simple face mask   Anesthesia Regional Block:  Axillary brachial plexus block  Pre-Anesthetic Checklist: ,, timeout performed, Correct Patient, Correct Site, Correct Laterality, Correct Procedure, Correct Position, site marked, Risks and benefits discussed, Surgical consent,  Pre-op evaluation,  At surgeon's request  Laterality: Upper and Right  Prep: chloraprep       Needles:  Injection technique: Single-shot  Needle Type: Echogenic Needle          Additional Needles:  Procedures: ultrasound guided (picture in chart) Axillary brachial plexus block Narrative:  Injection made incrementally with aspirations every 5 mL.  Performed by: Personally   Additional Notes: H+P and labs reviewed, risks and benefits discussed with patient, procedure tolerated well without complications

## 2015-06-30 NOTE — Consult Note (Signed)
Reason for Consult: [name] is an 81 y.o. female.  HPI: Patient is very pleasant 81 year female who has a history of a comminuted complex articular right distal radius fracture. She was seen and evaluated in our office setting given her overall fracture parameters I propensity for progressive angulation, collapse and a painful arthritic wrist we discussed with the recommendation to proceeding ahead with operative intervention. She does have a history of DVTs, as well as PE  Perioperatively s/p TKA and recently stopped taking eliquis.  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 Ammie Ferrier colleagues as he was out of the office. Was recommended that she begin Lovenox postoperatively and follow up in their office next week.We have had lengthy discussion with the patient in regards to her upper extremity predicament and surrounding issues.All questions were encouraged and answered.  Past Medical History  Diagnosis Date  . Hypertension   . Chronic kidney disease     current uti 08/05/11   . Arthritis   . Pulmonary emboli (HCC)     bilateral 8/12 hospitalized   . Cancer ([place])     hx of melanoma in 1971, basal cell   . Dysrhythmia     hx of heart racing - in past, "none  in years".  had a stress test > 7 years  . DVT (deep venous thrombosis) ([place]) 01/2015    was Eliquis until 06/13/15-   . Depression   . History of recurrent UTIs     "bladder infection"  Had a PICC line in past    Past Surgical History  Procedure Laterality Date  . Other surgical history Right     leg - melonoma  . Left wrist       surgery x 2   . Abdominal hysterectomy      1981  . Total knee arthroplasty  08/11/2011    Procedure: TOTAL KNEE ARTHROPLASTY;  Surgeon:  [name], MD;  Location: [facility] ORS;  Service: Orthopedics;  Laterality: Left;  . Breast biopsy Right 1979    Negative  . Total knee arthroplasty Left 2010  . Colonoscopy w/ polypectomy      History reviewed. No pertinent family history.  Social History:  reports that she has never smoked. She has never used smokeless tobacco. She reports that she drinks about 0.6 oz of alcohol per week. She reports that she does not use illicit drugs.  Allergies:  Allergies  Allergen Reactions  . Penicillins Anaphylaxis and Other (See Comments)    Has patient had a PCN reaction causing immediate rash, facial/tongue/throat swelling, SOB or lightheadedness with hypotension: yes Has patient had a PCN reaction causing severe rash involving mucus membranes or skin necrosis no Has patient had a PCN reaction that required hospitalization no Has patient had a PCN reaction occurring within the last 10 years: no If all of the above answers are "NO", then may proceed with Cephalosporin use.   . Caffeine Swelling  . Monosodium Glutamate Other (See Comments)    Migraine headache  . Sulfa Antibiotics Other (See Comments)    "pass out"  . Celebrex [Celecoxib] Rash  . Clindamycin/Lincomycin Rash  . Diclofenac Rash  . Latex Rash  . Macrobid [Nitrofurantoin Monohyd Macro] Rash    Medications:  Tylenol 500  mg prn, lyrica 50 mg metoprolol succinate 50 mg 24 hr, vitamin B-12  102mcg/ml jection, Voltaren gel 1%, oxycodone 5 mg 1-2 by mouth every 4 6 when necessary pain, Ambien 5 mg 1 by mouth daily at bedtime when necessary, Lasix 20 mg, vitamin D supplement Effexor XR 75 mg  24 hours capsule         No results found for this or any previous visit (from the past 48 hour(s)).  No results found.  ROS Blood pressure 121/62, pulse 62, temperature 98 F (36.7 C), temperature source Oral, resp. rate 26, height 5\' 5"  (1.651 m), weight 68.04 kg (150 lb), SpO2 100 %. Physical Exam Evaluation of the right upper  extremity shows that her splint is clean dry and intact, her skin is intact neurovascularly she is intact. She has no signs of compartment syndrome. Digital range of motion is improved she has mild edema about the digits and dorsal hand The patient is alert and oriented in no acute distress. The patient complains of pain in the affected upper extremity.  The patient is noted to have a normal HEENT exam. Lung fields show equal chest expansion and no shortness of breath. Abdomen exam is nontender without distention. Lower extremity examination does not show any fracture dislocation or blood clot symptoms. Pelvis is stable and the neck and back are stable and nontender. Assessment/Plan: Right comminuted complex articular distal radius fracture History of PAT with SVT History of depression History of insomnia  We are planning surgery for your upper extremity. The risk and benefits of surgery to include risk of bleeding, infection, anesthesia,  damage to normal structures and failure of the surgery to accomplish its intended goals of relieving symptoms and restoring function have been discussed in detail. With this in mind we plan to proceed. I have specifically discussed with the patient the pre-and postoperative regime and the dos and don'ts and risk and benefits in great detail. Risk and benefits of surgery also include risk of dystrophy(CRPS), chronic nerve pain, failure of the healing process to go onto completion and other inherent risks of surgery The relavent the pathophysiology of the disease/injury process, as well as the alternatives for treatment and postoperative course of action has been discussed in great detail with the patient who desires to proceed.  We will do everything in our power to help you (the patient) restore function to the upper extremity. It is a pleasure to see this patient today.  Anita Bailey L 06/30/2015, 10:17 AM

## 2015-06-30 NOTE — Anesthesia Preprocedure Evaluation (Signed)
Anesthesia Evaluation  Patient identified by MRN, date of birth, ID band Patient awake    Reviewed: Allergy & Precautions, NPO status , Patient's Chart, lab work & pertinent test results, reviewed documented beta blocker date and time   Airway Mallampati: IV  TM Distance: <3 FB Neck ROM: Full    Dental  (+) Teeth Intact   Pulmonary neg pulmonary ROS,    breath sounds clear to auscultation       Cardiovascular hypertension, Pt. on home beta blockers + dysrhythmias Supra Ventricular Tachycardia  Rhythm:Regular     Neuro/Psych PSYCHIATRIC DISORDERS Depression negative neurological ROS     GI/Hepatic negative GI ROS, Neg liver ROS,   Endo/Other    Renal/GU Renal diseasenegative Renal ROSFrequent uti     Musculoskeletal  (+) Arthritis ,   Abdominal   Peds  Hematology   Anesthesia Other Findings   Reproductive/Obstetrics                             Anesthesia Physical Anesthesia Plan  ASA: II  Anesthesia Plan: MAC and Regional   Post-op Pain Management:    Induction: Intravenous  Airway Management Planned: Nasal Cannula, Natural Airway and Simple Face Mask  Additional Equipment: None  Intra-op Plan:   Post-operative Plan:   Informed Consent: I have reviewed the patients History and Physical, chart, labs and discussed the procedure including the risks, benefits and alternatives for the proposed anesthesia with the patient or authorized representative who has indicated his/her understanding and acceptance.   Dental advisory given  Plan Discussed with: CRNA and Surgeon  Anesthesia Plan Comments:         Anesthesia Quick Evaluation

## 2015-06-30 NOTE — Op Note (Signed)
See dictation [phone]  Status post open reduction internal fixation right radius fracture comminuted complex grade 3 part.  All questions haven't encouraged and answered with family.  We'll give her a dose of Lovenox tomorrow. I've emphasized early mobilization of the legs and sitting upright in chair. We will also place her on sequential compression devices.  All questions have been addressed  Jenayah Antu M.D.

## 2015-06-30 NOTE — Progress Notes (Signed)
Start vancomycin now per OR

## 2015-06-30 NOTE — Progress Notes (Signed)
Dr. Amedeo Plenty updating patient on delay. Dr. Amedeo Plenty notified that vancomycin was started at 08:37.

## 2015-07-01 DIAGNOSIS — S52551A Other extraarticular fracture of lower end of right radius, initial encounter for closed fracture: Secondary | ICD-10-CM | POA: Diagnosis not present

## 2015-07-01 NOTE — Progress Notes (Signed)
PT Cancellation Note  Patient Details Name: Anita Bailey MRN: [id number] DOB: [birth year]   Cancelled Treatment:    Reason Eval/Treat Not Completed: PT screened, no needs identified, will sign off, per OT, pt mod I with ambulation and been going to outpt PT for balance and plans to return as soon as able. Will defer further PT to outpt setting.    [place], [place] 07/01/2015, 10:48 AM

## 2015-07-01 NOTE — Discharge Instructions (Signed)
Please elevate your arm at all times  Your block should wear off today and it is going to be very important to move your fingers, massage her fingers and prevent swelling  Please call Dr. Ammie Ferrier office tomorrow for further DVT precautions and plans  Our office will call for your follow-up in 13 days  Keep bandage clean and dry.  Call for any problems.  No smoking.  Criteria for driving a car: you should be off your pain medicine for 7-8 hours, able to drive one handed(confident), thinking clearly and feeling able in your judgement to drive. Continue elevation as it will decrease swelling.  If instructed by MD move your fingers within the confines of the bandage/splint.  Use ice if instructed by your MD. Call immediately for any sudden loss of feeling in your hand/arm or change in functional abilities of the extremity.We recommend that you to take vitamin C 1000 mg a day to promote healing. We also recommend that if you require  pain medicine that you take a stool softener to prevent constipation as most pain medicines will have constipation side effects. We recommend either Peri-Colace or Senokot and recommend that you also consider adding MiraLAX as well to prevent the constipation affects from pain medicine if you are required to use them. These medicines are over the counter and may be purchased at a local pharmacy. A cup of yogurt and a probiotic can also be helpful during the recovery process as the medicines can disrupt your intestinal environment.

## 2015-07-01 NOTE — Discharge Summary (Signed)
  Patient has been seen and examined. Patient has pain appropriate to his injury/process. Patient denies new complaints at this present time. I have discussed the care pathway with nursing staff. Patient is appropriate and alert.  We reviewed vital signs and intake output which are stable.  The upper extremity is neurovascularly intact. Refill is normal. There is no signs of compartment syndrome. There is no signs of dystrophy. There is normal sensation.  I have spent a  great deal of time discussing range of motion edema control and other techniques to decrease edema and promote flexion extension of the fingers. Patient understands the importance of elevation range of motion massage and other measures to lessen pain and prevent swelling.  We have also discussed immobilization to appropriate areas involved.  We have discussed with the patient shoulder range of motion to prevent adhesive capsulitis.  The remainder of the examination is normal today without complicating feature.  Drain was removed without difficulty  Patient will be discharged home. Will plan to see the patient back in the office as per discharge instructions (please see discharge instructions).  Patient had an uneventful hospital course. At the time of discharge patient is stable awake alert and oriented in no acute distress. Regular diet will be continued and has been tolerated. Patient will notify should have problems occur. There is no signs of DVT infection or other complication at this juncture.  All questions have been incurred and answered.  Please see discharge med list SP ORIF right radius fracture Patient to call Dr Sanjuan Dame office for DVT prophylaxis per his preference-Lovenox given today Reeanna Acri MD

## 2015-07-01 NOTE — Evaluation (Signed)
Occupational Therapy Evaluation and Discharge Patient Details Name: AVIN CHANLEY MRN: [id number] DOB: [birth year] Today's Date: 07/01/2015    History of Present Illness Pt is a 81 y.o. female s/p ORIF RIGHT DISTAL RADIUS FRACTURE. PMHx: HTN, Arithritis, Chronic kidney disease, Cancer, DVT, Pulmonary emboli, Recurrent UTI, Bil TKA, L wrist sx x2.    Clinical Impression   Pt reports she was independent with ADLs and mobility PTA. Currently pt overall supervision for safety with ADLs and functional mobility. Pt reports she has a history of falls resulting in fxs but she is not a "fall risk"; all falls have been pure accidents. Pt recalls a lot of education from previous wrist fx but reviewed edema management strategies with pt. Pt planning to d/c to her sons house where she will have 24/7 supervision. No further acute OT needs identified; signing off at this time. Please re-consult if needs change. Thank you for this referral.    Follow Up Recommendations  Supervision - Intermittent;Other (comment) (follow up per MD)    Equipment Recommendations  None recommended by OT    Recommendations for Other Services       Precautions / Restrictions Precautions Precautions: Fall Restrictions Weight Bearing Restrictions: Yes RUE Weight Bearing: Non weight bearing      Mobility Bed Mobility Overal bed mobility: Modified Independent             General bed mobility comments: HOB elevated; coming to L side.  Transfers Overall transfer level: Needs assistance Equipment used: Straight cane Transfers: Sit to/from Stand Sit to Stand: Supervision         General transfer comment: Supervision for safety; no physical assist required.    Balance Overall balance assessment: No apparent balance deficits (not formally assessed);History of Falls                                          ADL Overall ADL's : Needs assistance/impaired                                      Functional mobility during ADLs: Supervision/safety;Cane General ADL Comments: Pt overall supervision for safety with ADLs and functional mobility. Has hx of L arm fx; remembers a lot from managing that. Educated pt on edema management techniuqes (ice, elevation, retrograde massage, and digit ROM). Pt reports she has fallen many times resulting in fxs but they were all accidents. She receives outpt PT for strengthening PTA.      Vision Vision Assessment?: No apparent visual deficits   Perception     Praxis      Pertinent Vitals/Pain Pain Assessment: No/denies pain     Hand Dominance Right   Extremity/Trunk Assessment Upper Extremity Assessment Upper Extremity Assessment: RUE deficits/detail RUE Deficits / Details: Limited finger ROM secondary to edema. Elbow and shoulder WFL. RUE: Unable to fully assess due to immobilization   Lower Extremity Assessment Lower Extremity Assessment: Overall WFL for tasks assessed   Cervical / Trunk Assessment Cervical / Trunk Assessment: Normal   Communication Communication Communication: No difficulties   Cognition Arousal/Alertness: Awake/alert Behavior During Therapy: WFL for tasks assessed/performed Overall Cognitive Status: Within Functional Limits for tasks assessed                     General Comments  Exercises       Shoulder Instructions      Home Living Family/patient expects to be discharged to:: Private residence Living Arrangements: Alone Available Help at Discharge: Family;Available 24 hours/day Type of Home: House       Home Layout: Two level;Able to live on main level with bedroom/bathroom     Bathroom Shower/Tub: Occupational psychologist: Handicapped height     Home Equipment: [place] - single point;Shower seat - built in   Additional Comments: Plan is to d/c to sons house; above information reflects his home      Prior Functioning/Environment Level of Independence:  Independent             OT Diagnosis: Acute pain   OT Problem List:     OT Treatment/Interventions:      OT Goals(Current goals can be found in the care plan section) Acute Rehab OT Goals Patient Stated Goal: return to independence OT Goal Formulation: All assessment and education complete, DC therapy  OT Frequency:     Barriers to D/C:            Co-evaluation              End of Session Equipment Utilized During Treatment: Other (comment) (sling)  Activity Tolerance: Patient tolerated treatment well Patient left: in chair;with call bell/phone within reach   Time: 0905-0922 OT Time Calculation (min): 17 min Charges:  OT General Charges $OT Visit: 1 Procedure OT Evaluation $OT Eval Moderate Complexity: 1 Procedure G-Codes: OT G-codes **NOT FOR INPATIENT CLASS** Functional Assessment Tool Used: Clinical judgement Functional Limitation: Self care Self Care Current Status [id number]): At least 1 percent but less than 20 percent impaired, limited or restricted Self Care Goal Status [id number]): At least 1 percent but less than 20 percent impaired, limited or restricted Self Care Discharge Status [phone]): At least 1 percent but less than 20 percent impaired, limited or restricted   Binnie Kand M.S., OTR/L Pager: [phone]  07/01/2015, 9:34 AM

## 2015-07-01 NOTE — Progress Notes (Signed)
Discharge instructions gone over with patient. Home medications gone over. Follow up appointments to be made. Patient and son instructed on how to give lovenox injections, if needed at home. Diet, activity, and incisional care gone over. My chart explained. Reasons to call the doctor and reasons to call 911 gone over. Patient verbalized understanding of instructions.

## 2015-07-02 ENCOUNTER — Encounter (HOSPITAL_COMMUNITY): Payer: Self-pay | Admitting: Orthopedic Surgery

## 2015-07-09 DIAGNOSIS — R7989 Other specified abnormal findings of blood chemistry: Secondary | ICD-10-CM | POA: Insufficient documentation

## 2015-08-02 ENCOUNTER — Other Ambulatory Visit: Payer: Self-pay | Admitting: Orthopedic Surgery

## 2015-08-02 DIAGNOSIS — M48061 Spinal stenosis, lumbar region without neurogenic claudication: Secondary | ICD-10-CM

## 2015-08-06 ENCOUNTER — Other Ambulatory Visit: Payer: Self-pay | Admitting: Orthopedic Surgery

## 2015-08-06 ENCOUNTER — Ambulatory Visit
Admission: RE | Admit: 2015-08-06 | Discharge: 2015-08-06 | Disposition: A | Discharge status: Home / Self Care | Payer: Medicare Other | Source: Ambulatory Visit | Attending: Orthopedic Surgery | Admitting: Orthopedic Surgery

## 2015-08-06 DIAGNOSIS — M48061 Spinal stenosis, lumbar region without neurogenic claudication: Secondary | ICD-10-CM

## 2015-08-06 MED ORDER — IOPAMIDOL (ISOVUE-M 200) INJECTION 41%
1.0000 mL | Freq: Once | INTRAMUSCULAR | Status: AC
  Administered 2015-08-06: 1 mL via EPIDURAL

## 2015-08-06 MED ORDER — METHYLPREDNISOLONE ACETATE 40 MG/ML INJ SUSP (RADIOLOG
120.0000 mg | Freq: Once | INTRAMUSCULAR | Status: AC
  Administered 2015-08-06: 120 mg via EPIDURAL

## 2015-08-06 NOTE — Discharge Instructions (Signed)

## 2015-09-10 ENCOUNTER — Other Ambulatory Visit: Payer: Self-pay | Admitting: Orthopedic Surgery

## 2015-09-10 DIAGNOSIS — M5410 Radiculopathy, site unspecified: Secondary | ICD-10-CM

## 2015-09-21 ENCOUNTER — Ambulatory Visit
Admission: RE | Admit: 2015-09-21 | Discharge: 2015-09-21 | Disposition: A | Discharge status: Home / Self Care | Payer: Medicare Other | Source: Ambulatory Visit | Attending: Orthopedic Surgery | Admitting: Orthopedic Surgery

## 2015-09-21 DIAGNOSIS — M5410 Radiculopathy, site unspecified: Secondary | ICD-10-CM

## 2015-09-21 MED ORDER — METHYLPREDNISOLONE ACETATE 40 MG/ML INJ SUSP (RADIOLOG
120.0000 mg | Freq: Once | INTRAMUSCULAR | Status: AC
  Administered 2015-09-21: 120 mg via EPIDURAL

## 2015-09-21 MED ORDER — IOPAMIDOL (ISOVUE-M 200) INJECTION 41%
1.0000 mL | Freq: Once | INTRAMUSCULAR | Status: AC
  Administered 2015-09-21: 1 mL via EPIDURAL

## 2015-09-21 NOTE — Discharge Instructions (Signed)

## 2015-11-13 ENCOUNTER — Other Ambulatory Visit: Payer: Self-pay | Admitting: Orthopedic Surgery

## 2015-11-13 DIAGNOSIS — M5410 Radiculopathy, site unspecified: Secondary | ICD-10-CM

## 2015-11-15 ENCOUNTER — Ambulatory Visit
Admission: RE | Admit: 2015-11-15 | Discharge: 2015-11-15 | Disposition: A | Discharge status: Home / Self Care | Payer: Medicare Other | Source: Ambulatory Visit | Attending: Orthopedic Surgery | Admitting: Orthopedic Surgery

## 2015-11-15 DIAGNOSIS — M5410 Radiculopathy, site unspecified: Secondary | ICD-10-CM

## 2015-11-15 MED ORDER — METHYLPREDNISOLONE ACETATE 40 MG/ML INJ SUSP (RADIOLOG
120.0000 mg | Freq: Once | INTRAMUSCULAR | Status: AC
  Administered 2015-11-15: 120 mg via EPIDURAL

## 2015-11-15 MED ORDER — IOPAMIDOL (ISOVUE-M 200) INJECTION 41%
1.0000 mL | Freq: Once | INTRAMUSCULAR | Status: AC
  Administered 2015-11-15: 1 mL via EPIDURAL

## 2015-11-15 NOTE — Discharge Instructions (Signed)

## 2015-11-22 ENCOUNTER — Other Ambulatory Visit: Payer: Medicare Other

## 2015-11-23 ENCOUNTER — Other Ambulatory Visit: Payer: Self-pay | Admitting: Internal Medicine

## 2015-11-23 DIAGNOSIS — Z1231 Encounter for screening mammogram for malignant neoplasm of breast: Secondary | ICD-10-CM

## 2015-12-28 ENCOUNTER — Ambulatory Visit
Admission: RE | Admit: 2015-12-28 | Discharge: 2015-12-28 | Disposition: A | Discharge status: Home / Self Care | Payer: Medicare Other | Source: Ambulatory Visit | Attending: Internal Medicine | Admitting: Internal Medicine

## 2015-12-28 DIAGNOSIS — Z1231 Encounter for screening mammogram for malignant neoplasm of breast: Secondary | ICD-10-CM | POA: Diagnosis present

## 2016-03-05 ENCOUNTER — Other Ambulatory Visit: Payer: Self-pay | Admitting: Orthopedic Surgery

## 2016-03-05 DIAGNOSIS — M5136 Other intervertebral disc degeneration, lumbar region: Secondary | ICD-10-CM

## 2016-03-28 ENCOUNTER — Ambulatory Visit
Admission: RE | Admit: 2016-03-28 | Discharge: 2016-03-28 | Disposition: A | Discharge status: Home / Self Care | Payer: Medicare Other | Source: Ambulatory Visit | Attending: Orthopedic Surgery | Admitting: Orthopedic Surgery

## 2016-03-28 DIAGNOSIS — M5136 Other intervertebral disc degeneration, lumbar region: Secondary | ICD-10-CM

## 2016-03-28 MED ORDER — METHYLPREDNISOLONE ACETATE 40 MG/ML INJ SUSP (RADIOLOG
120.0000 mg | Freq: Once | INTRAMUSCULAR | Status: AC
  Administered 2016-03-28: 120 mg via EPIDURAL

## 2016-03-28 MED ORDER — IOPAMIDOL (ISOVUE-M 200) INJECTION 41%
1.0000 mL | Freq: Once | INTRAMUSCULAR | Status: AC
  Administered 2016-03-28: 1 mL via EPIDURAL

## 2016-03-28 NOTE — Discharge Instructions (Signed)

## 2016-05-09 ENCOUNTER — Other Ambulatory Visit: Payer: Self-pay | Admitting: Specialist

## 2016-05-09 DIAGNOSIS — M5136 Other intervertebral disc degeneration, lumbar region: Secondary | ICD-10-CM

## 2016-05-15 ENCOUNTER — Ambulatory Visit
Admission: RE | Admit: 2016-05-15 | Discharge: 2016-05-15 | Disposition: A | Discharge status: Home / Self Care | Payer: Medicare Other | Source: Ambulatory Visit | Attending: Specialist | Admitting: Specialist

## 2016-05-15 DIAGNOSIS — M5136 Other intervertebral disc degeneration, lumbar region: Secondary | ICD-10-CM

## 2016-05-15 MED ORDER — IOPAMIDOL (ISOVUE-M 200) INJECTION 41%
1.0000 mL | Freq: Once | INTRAMUSCULAR | Status: AC
  Administered 2016-05-15: 1 mL via EPIDURAL

## 2016-05-15 MED ORDER — METHYLPREDNISOLONE ACETATE 40 MG/ML INJ SUSP (RADIOLOG
120.0000 mg | Freq: Once | INTRAMUSCULAR | Status: AC
  Administered 2016-05-15: 120 mg via EPIDURAL

## 2016-05-15 NOTE — Discharge Instructions (Signed)

## 2016-07-21 ENCOUNTER — Ambulatory Visit (INDEPENDENT_AMBULATORY_CARE_PROVIDER_SITE_OTHER): Payer: Self-pay | Admitting: Vascular Surgery

## 2016-07-21 ENCOUNTER — Encounter (INDEPENDENT_AMBULATORY_CARE_PROVIDER_SITE_OTHER): Payer: Self-pay

## 2016-07-30 ENCOUNTER — Other Ambulatory Visit (INDEPENDENT_AMBULATORY_CARE_PROVIDER_SITE_OTHER): Payer: Self-pay | Admitting: Vascular Surgery

## 2016-07-30 DIAGNOSIS — I743 Embolism and thrombosis of arteries of the lower extremities: Secondary | ICD-10-CM

## 2016-07-31 ENCOUNTER — Ambulatory Visit (INDEPENDENT_AMBULATORY_CARE_PROVIDER_SITE_OTHER): Payer: Medicare Other

## 2016-07-31 ENCOUNTER — Encounter (INDEPENDENT_AMBULATORY_CARE_PROVIDER_SITE_OTHER): Payer: Self-pay | Admitting: Vascular Surgery

## 2016-07-31 ENCOUNTER — Other Ambulatory Visit (INDEPENDENT_AMBULATORY_CARE_PROVIDER_SITE_OTHER): Payer: Self-pay | Admitting: Vascular Surgery

## 2016-07-31 ENCOUNTER — Ambulatory Visit (INDEPENDENT_AMBULATORY_CARE_PROVIDER_SITE_OTHER): Payer: Medicare Other | Admitting: Vascular Surgery

## 2016-07-31 DIAGNOSIS — M1990 Unspecified osteoarthritis, unspecified site: Secondary | ICD-10-CM

## 2016-07-31 DIAGNOSIS — I743 Embolism and thrombosis of arteries of the lower extremities: Secondary | ICD-10-CM | POA: Diagnosis not present

## 2016-07-31 DIAGNOSIS — I701 Atherosclerosis of renal artery: Secondary | ICD-10-CM | POA: Diagnosis not present

## 2016-07-31 DIAGNOSIS — I10 Essential (primary) hypertension: Secondary | ICD-10-CM | POA: Diagnosis not present

## 2016-07-31 NOTE — Progress Notes (Signed)
MRN : [id number]  Anita Bailey is a 82 y.o. (10/14/1934) female who presents with chief complaint of  Chief Complaint  Patient presents with  . ultrasound follow up  .  History of Present Illness: Patient returns for vascular evaluation of renal artery stenosis. She reports doing well. Blood pressure is well-controlled. No issues with renal function.  The patient denies changes in claudication symptoms or new rest pain symptoms. No new ulcers or wounds of the foot.  The patient denies amaurosis fugax or recent TIA symptoms. There are no recent neurological changes noted. The patient denies history of DVT, PE or superficial thrombophlebitis. The patient denies recent episodes of angina or shortness of breath.  Renal artery duplex today shows greater than 60% stenosis of the right renal artery, high resistant waveforms present in the left renal artery suggesting intrinsic renal disease, atrophy of the left kidney. When compared to prior study no change in right renal artery velocities.  Current Meds  Medication Sig  . acetaminophen (TYLENOL) 500 MG tablet Take 1,000 mg by mouth every 6 (six) hours as needed (For pain.).  [name] cyanocobalamin (,VITAMIN B-12,) 1000 MCG/ML injection Inject 1,000 mcg into the muscle every 30 (thirty) days.  . diclofenac sodium (VOLTAREN) 1 % GEL Apply 2 g topically 2 (two) times daily as needed (For pain.).   [name] furosemide (LASIX) 20 MG tablet Take 20 mg by mouth daily as needed for fluid or edema.   [name] HYDROcodone-acetaminophen (NORCO/VICODIN) 5-325 MG tablet TK 1 T PO Q 6 H PRF PAIN  . LYRICA 50 MG capsule Take 50 mg by mouth 2 (two) times daily.  . metoprolol succinate (TOPROL-XL) 50 MG 24 hr tablet Take 50 mg by mouth 2 (two) times daily. Take with or immediately following a meal.  . venlafaxine XR (EFFEXOR-XR) 75 MG 24 hr capsule Take 75 mg by mouth daily.  . Vitamin D, Ergocalciferol, (DRISDOL) 50000 units CAPS capsule Take 50,000 Units by mouth  every Saturday.   . zolpidem (AMBIEN) 5 MG tablet Take 2.5-5 mg by mouth at bedtime.     Past Medical History:  Diagnosis Date  . Arthritis   . Cancer ([place])    hx of melanoma in 1971, basal cell   . Chronic kidney disease    current uti 08/05/11   . Depression   . DVT (deep venous thrombosis) ([place]) 01/2015   was Eliquis until 06/13/15-   . Dysrhythmia    hx of heart racing - in past, "none  in years".  had a stress test > 7 years  . History of recurrent UTIs    "bladder infection"  Had a PICC line in past  . Hypertension   . Pulmonary emboli [facility])    bilateral 8/12 hospitalized     Past Surgical History:  Procedure Laterality Date  . ABDOMINAL HYSTERECTOMY     1981  . BREAST BIOPSY Right 1979   Negative  . COLONOSCOPY W/ POLYPECTOMY    . left wrist      surgery x 2   . OPEN REDUCTION INTERNAL FIXATION (ORIF) DISTAL RADIAL FRACTURE Right 06/30/2015   Procedure: OPEN REDUCTION INTERNAL FIXATION (ORIF) RIGHT DISTAL RADIUS FRACTURE ;  Surgeon: [name], MD;  Location: [facility];  Service: Orthopedics;  Laterality: Right;  . OTHER SURGICAL HISTORY Right    leg - melonoma  . TOTAL KNEE ARTHROPLASTY  08/11/2011   Procedure: TOTAL KNEE ARTHROPLASTY;  Surgeon: [name], MD;  Location: [facility];  Service: Orthopedics;  Laterality: Left;  . TOTAL KNEE ARTHROPLASTY Left 2010    Social History Social History  Substance Use Topics  . Smoking status: Never Smoker  . Smokeless tobacco: Never Used  . Alcohol use 0.6 oz/week    1 Glasses of wine per week    Family History Family History  Problem Relation Age of Onset  . Breast cancer Cousin     Allergies  Allergen Reactions  . Penicillins Anaphylaxis and Other (See Comments)    Has patient had a PCN reaction causing immediate rash, facial/tongue/throat swelling, SOB or lightheadedness with hypotension: yes Has patient had a PCN reaction causing severe rash involving mucus membranes or skin necrosis no Has patient had a PCN  reaction that required hospitalization no Has patient had a PCN reaction occurring within the last 10 years: no If all of the above answers are "NO", then may proceed with Cephalosporin use.   . Monosodium Glutamate Other (See Comments)    Migraine headache  . Sulfa Antibiotics Other (See Comments)    "pass out"  . Caffeine Swelling  . Celebrex [Celecoxib] Rash  . Clindamycin/Lincomycin Rash  . Diclofenac Rash  . Latex Rash  . Macrobid [employer] Macro] Rash     REVIEW OF SYSTEMS (Negative unless checked)  Constitutional: [] Weight loss  [] Fever  [] Chills Cardiac: [] Chest pain   [] Chest pressure   [] Palpitations   [] Shortness of breath when laying flat   [] Shortness of breath with exertion. Vascular:  [] Pain in legs with walking   [] Pain in legs at rest  [] History of DVT   [] Phlebitis   [] Swelling in legs   [] Varicose veins   [] Non-healing ulcers Pulmonary:   [] Uses home oxygen   [] Productive cough   [] Hemoptysis   [] Wheeze  [] COPD   [] Asthma Neurologic:  [] Dizziness   [] Seizures   [] History of stroke   [] History of TIA  [] Aphasia   [] Vissual changes   [] Weakness or numbness in arm   [] Weakness or numbness in leg Musculoskeletal:   [] Joint swelling   [] Joint pain   [] Low back pain Hematologic:  [] Easy bruising  [] Easy bleeding   [] Hypercoagulable state   [] Anemic Gastrointestinal:  [] Diarrhea   [] Vomiting  [] Gastroesophageal reflux/heartburn   [] Difficulty swallowing. Genitourinary:  [] Chronic kidney disease   [] Difficult urination  [] Frequent urination   [] Blood in urine Skin:  [] Rashes   [] Ulcers  Psychological:  [] History of anxiety   []  History of major depression.  Physical Examination  Vitals:   07/31/16 1036  BP: (!) 141/74  Pulse: (!) 53  Resp: 16  Weight: 151 lb (68.5 kg)   Body mass index is 25.13 kg/m. Gen: WD/WN, NAD Head: [place]/AT, No temporalis wasting.  Ear/Nose/Throat: Hearing grossly intact, nares w/o erythema or drainage Eyes: PER, EOMI, sclera  nonicteric.  Neck: Supple, no large masses.   Pulmonary:  Good air movement, no audible wheezing bilaterally, no use of accessory muscles.  Cardiac: RRR, no JVD Vascular: Vessel Right Left  Radial Palpable Palpable  Ulnar Palpable Palpable  Brachial Palpable Palpable  Carotid Palpable Palpable  Femoral Palpable Palpable  Popliteal Palpable Palpable  PT Palpable Palpable  DP Palpable Palpable  Gastrointestinal: Non-distended. No guarding/no peritoneal signs.  Musculoskeletal: M/S 5/5 throughout.  No deformity or atrophy.  Neurologic: CN 2-12 intact. Symmetrical.  Speech is fluent. Motor exam as listed above. Psychiatric: Judgment intact, Mood & affect appropriate for pt's clinical situation. Dermatologic: No rashes or ulcers noted.  No changes consistent with cellulitis. Lymph :  No lichenification or skin changes of chronic lymphedema.  CBC Lab Results  Component Value Date   WBC 7.2 06/30/2015   HGB 12.5 06/30/2015   HCT 40.3 06/30/2015   MCV 95.0 06/30/2015   PLT 260 06/30/2015    BMET    Component Value Date/Time   NA 138 06/30/2015 1336   K 5.0 06/30/2015 1336   CL 106 06/30/2015 1336   CO2 28 06/30/2015 1336   GLUCOSE 140 (H) 06/30/2015 1336   BUN 17 06/30/2015 1336   CREATININE 0.84 06/30/2015 1336   CALCIUM 9.0 06/30/2015 1336   GFRNONAA >60 06/30/2015 1336   GFRAA >60 06/30/2015 1336   CrCl cannot be calculated (Patient's most recent lab result is older than the maximum 21 days allowed.).  COAG Lab Results  Component Value Date   INR 0.99 11/10/2008    Radiology No results found.  Assessment/Plan 1. Essential hypertension Continue antihypertensive medications as already ordered, these medications have been reviewed and there are no changes at this time.   2. Renal artery stenosis (HCC) Duplex ultrasound shows a stable moderate stenosis there has been no loss of control of her hypertension.  Continue antihypertensive medications as already  ordered, these medications have been reviewed and there are no changes at this time.   3. Osteoarthritis, unspecified osteoarthritis type, unspecified site Continue NSAID medications as already ordered, these medications have been reviewed and there are no changes at this time.     Hortencia Pilar, MD  07/31/2016 11:03 AM

## 2016-08-01 ENCOUNTER — Other Ambulatory Visit: Payer: Self-pay | Admitting: Specialist

## 2016-08-01 DIAGNOSIS — M5136 Other intervertebral disc degeneration, lumbar region: Secondary | ICD-10-CM

## 2016-08-04 ENCOUNTER — Ambulatory Visit
Admission: RE | Admit: 2016-08-04 | Discharge: 2016-08-04 | Disposition: A | Discharge status: Home / Self Care | Payer: Medicare Other | Source: Ambulatory Visit | Attending: Specialist | Admitting: Specialist

## 2016-08-04 DIAGNOSIS — M5136 Other intervertebral disc degeneration, lumbar region: Secondary | ICD-10-CM

## 2016-08-04 MED ORDER — IOPAMIDOL (ISOVUE-M 200) INJECTION 41%
1.0000 mL | Freq: Once | INTRAMUSCULAR | Status: AC
  Administered 2016-08-04: 1 mL via EPIDURAL

## 2016-08-04 MED ORDER — METHYLPREDNISOLONE ACETATE 40 MG/ML INJ SUSP (RADIOLOG
120.0000 mg | Freq: Once | INTRAMUSCULAR | Status: AC
  Administered 2016-08-04: 120 mg via EPIDURAL

## 2016-08-04 NOTE — Discharge Instructions (Signed)

## 2016-09-11 ENCOUNTER — Other Ambulatory Visit: Payer: Self-pay | Admitting: Specialist

## 2016-09-11 DIAGNOSIS — M5410 Radiculopathy, site unspecified: Secondary | ICD-10-CM

## 2016-09-23 ENCOUNTER — Ambulatory Visit
Admission: RE | Admit: 2016-09-23 | Discharge: 2016-09-23 | Disposition: A | Discharge status: Home / Self Care | Payer: Medicare Other | Source: Ambulatory Visit | Attending: Specialist | Admitting: Specialist

## 2016-09-23 DIAGNOSIS — M5410 Radiculopathy, site unspecified: Secondary | ICD-10-CM

## 2016-09-23 MED ORDER — IOPAMIDOL (ISOVUE-M 200) INJECTION 41%
1.0000 mL | Freq: Once | INTRAMUSCULAR | Status: AC
  Administered 2016-09-23: 1 mL via EPIDURAL

## 2016-09-23 MED ORDER — METHYLPREDNISOLONE ACETATE 40 MG/ML INJ SUSP (RADIOLOG
120.0000 mg | Freq: Once | INTRAMUSCULAR | Status: AC
  Administered 2016-09-23: 120 mg via EPIDURAL

## 2016-09-23 NOTE — Discharge Instructions (Signed)

## 2016-12-09 ENCOUNTER — Other Ambulatory Visit: Payer: Self-pay | Admitting: Specialist

## 2016-12-09 DIAGNOSIS — M5136 Other intervertebral disc degeneration, lumbar region: Secondary | ICD-10-CM

## 2016-12-19 ENCOUNTER — Other Ambulatory Visit: Payer: Self-pay | Admitting: Internal Medicine

## 2016-12-19 ENCOUNTER — Ambulatory Visit
Admission: RE | Admit: 2016-12-19 | Discharge: 2016-12-19 | Disposition: A | Discharge status: Home / Self Care | Payer: Medicare Other | Source: Ambulatory Visit | Attending: Specialist | Admitting: Specialist

## 2016-12-19 DIAGNOSIS — Z1231 Encounter for screening mammogram for malignant neoplasm of breast: Secondary | ICD-10-CM

## 2016-12-19 DIAGNOSIS — M5136 Other intervertebral disc degeneration, lumbar region: Secondary | ICD-10-CM

## 2016-12-19 MED ORDER — METHYLPREDNISOLONE ACETATE 40 MG/ML INJ SUSP (RADIOLOG
120.0000 mg | Freq: Once | INTRAMUSCULAR | Status: AC
  Administered 2016-12-19: 120 mg via EPIDURAL

## 2016-12-19 MED ORDER — IOPAMIDOL (ISOVUE-M 200) INJECTION 41%
1.0000 mL | Freq: Once | INTRAMUSCULAR | Status: AC
  Administered 2016-12-19: 1 mL via EPIDURAL

## 2016-12-19 NOTE — Discharge Instructions (Signed)

## 2017-01-19 ENCOUNTER — Ambulatory Visit
Admission: RE | Admit: 2017-01-19 | Discharge: 2017-01-19 | Disposition: A | Discharge status: Home / Self Care | Payer: Medicare Other | Source: Ambulatory Visit | Attending: Internal Medicine | Admitting: Internal Medicine

## 2017-01-19 ENCOUNTER — Other Ambulatory Visit: Payer: Self-pay | Admitting: Internal Medicine

## 2017-01-19 DIAGNOSIS — Z1231 Encounter for screening mammogram for malignant neoplasm of breast: Secondary | ICD-10-CM

## 2017-04-21 ENCOUNTER — Other Ambulatory Visit: Payer: Self-pay | Admitting: Specialist

## 2017-04-21 DIAGNOSIS — M48061 Spinal stenosis, lumbar region without neurogenic claudication: Secondary | ICD-10-CM

## 2017-04-28 ENCOUNTER — Encounter: Payer: Self-pay | Admitting: Student

## 2017-04-29 ENCOUNTER — Ambulatory Visit: Payer: Medicare Other | Admitting: Anesthesiology

## 2017-04-29 ENCOUNTER — Encounter
Admission: RE | Disposition: A | Discharge status: Home / Self Care | Payer: Self-pay | Source: Ambulatory Visit | Attending: Unknown Physician Specialty

## 2017-04-29 ENCOUNTER — Encounter: Payer: Self-pay | Admitting: *Deleted

## 2017-04-29 ENCOUNTER — Ambulatory Visit
Admission: RE | Admit: 2017-04-29 | Discharge: 2017-04-29 | Disposition: A | Discharge status: Home / Self Care | Payer: Medicare Other | Source: Ambulatory Visit | Attending: Unknown Physician Specialty | Admitting: Unknown Physician Specialty

## 2017-04-29 DIAGNOSIS — Z86711 Personal history of pulmonary embolism: Secondary | ICD-10-CM | POA: Insufficient documentation

## 2017-04-29 DIAGNOSIS — D122 Benign neoplasm of ascending colon: Secondary | ICD-10-CM | POA: Insufficient documentation

## 2017-04-29 DIAGNOSIS — Z90710 Acquired absence of both cervix and uterus: Secondary | ICD-10-CM | POA: Diagnosis not present

## 2017-04-29 DIAGNOSIS — E559 Vitamin D deficiency, unspecified: Secondary | ICD-10-CM | POA: Insufficient documentation

## 2017-04-29 DIAGNOSIS — D649 Anemia, unspecified: Secondary | ICD-10-CM | POA: Diagnosis not present

## 2017-04-29 DIAGNOSIS — Z1211 Encounter for screening for malignant neoplasm of colon: Secondary | ICD-10-CM | POA: Insufficient documentation

## 2017-04-29 DIAGNOSIS — N189 Chronic kidney disease, unspecified: Secondary | ICD-10-CM | POA: Insufficient documentation

## 2017-04-29 DIAGNOSIS — Z86010 Personal history of colonic polyps: Secondary | ICD-10-CM | POA: Insufficient documentation

## 2017-04-29 DIAGNOSIS — Z86718 Personal history of other venous thrombosis and embolism: Secondary | ICD-10-CM | POA: Diagnosis not present

## 2017-04-29 DIAGNOSIS — Z85828 Personal history of other malignant neoplasm of skin: Secondary | ICD-10-CM | POA: Insufficient documentation

## 2017-04-29 DIAGNOSIS — F329 Major depressive disorder, single episode, unspecified: Secondary | ICD-10-CM | POA: Diagnosis not present

## 2017-04-29 DIAGNOSIS — E785 Hyperlipidemia, unspecified: Secondary | ICD-10-CM | POA: Diagnosis not present

## 2017-04-29 DIAGNOSIS — D123 Benign neoplasm of transverse colon: Secondary | ICD-10-CM | POA: Diagnosis not present

## 2017-04-29 DIAGNOSIS — K640 First degree hemorrhoids: Secondary | ICD-10-CM | POA: Diagnosis not present

## 2017-04-29 DIAGNOSIS — M1990 Unspecified osteoarthritis, unspecified site: Secondary | ICD-10-CM | POA: Diagnosis not present

## 2017-04-29 DIAGNOSIS — Z87440 Personal history of urinary (tract) infections: Secondary | ICD-10-CM | POA: Insufficient documentation

## 2017-04-29 DIAGNOSIS — I129 Hypertensive chronic kidney disease with stage 1 through stage 4 chronic kidney disease, or unspecified chronic kidney disease: Secondary | ICD-10-CM | POA: Diagnosis not present

## 2017-04-29 DIAGNOSIS — Z79899 Other long term (current) drug therapy: Secondary | ICD-10-CM | POA: Diagnosis not present

## 2017-04-29 DIAGNOSIS — Z803 Family history of malignant neoplasm of breast: Secondary | ICD-10-CM | POA: Diagnosis not present

## 2017-04-29 DIAGNOSIS — Z96652 Presence of left artificial knee joint: Secondary | ICD-10-CM | POA: Diagnosis not present

## 2017-04-29 HISTORY — DX: Malignant melanoma of skin, unspecified: C43.9

## 2017-04-29 HISTORY — DX: Cervical disc disorder, unspecified, unspecified cervical region: M50.90

## 2017-04-29 HISTORY — DX: Anemia, unspecified: D64.9

## 2017-04-29 HISTORY — DX: Calculus of kidney: N20.0

## 2017-04-29 HISTORY — DX: Vitamin D deficiency, unspecified: E55.9

## 2017-04-29 HISTORY — DX: Hyperlipidemia, unspecified: E78.5

## 2017-04-29 HISTORY — PX: COLONOSCOPY WITH PROPOFOL: SHX5780

## 2017-04-29 SURGERY — COLONOSCOPY WITH PROPOFOL
Anesthesia: General

## 2017-04-29 MED ORDER — FENTANYL CITRATE (PF) 100 MCG/2ML IJ SOLN
INTRAMUSCULAR | Status: DC | PRN
  Administered 2017-04-29: 50 ug via INTRAVENOUS

## 2017-04-29 MED ORDER — SODIUM CHLORIDE 0.9 % IV SOLN
INTRAVENOUS | Status: DC
  Administered 2017-04-29: 10:00:00 via INTRAVENOUS
  Administered 2017-04-29: 1000 mL via INTRAVENOUS

## 2017-04-29 MED ORDER — PROPOFOL 500 MG/50ML IV EMUL
INTRAVENOUS | Status: DC | PRN
  Administered 2017-04-29: 140 ug/kg/min via INTRAVENOUS

## 2017-04-29 MED ORDER — PROPOFOL 10 MG/ML IV BOLUS
INTRAVENOUS | Status: DC | PRN
  Administered 2017-04-29: 10 mg via INTRAVENOUS
  Administered 2017-04-29: 40 mg via INTRAVENOUS
  Administered 2017-04-29: 10 mg via INTRAVENOUS

## 2017-04-29 MED ORDER — SODIUM CHLORIDE 0.9 % IV SOLN: INTRAVENOUS | Status: DC

## 2017-04-29 MED ORDER — PROPOFOL 500 MG/50ML IV EMUL
INTRAVENOUS | Status: AC
  Filled 2017-04-29: qty 50

## 2017-04-29 MED ORDER — FENTANYL CITRATE (PF) 100 MCG/2ML IJ SOLN
INTRAMUSCULAR | Status: AC
  Filled 2017-04-29: qty 2

## 2017-04-29 MED ORDER — EPHEDRINE SULFATE 50 MG/ML IJ SOLN
INTRAMUSCULAR | Status: DC | PRN
  Administered 2017-04-29: 15 mg via INTRAVENOUS
  Administered 2017-04-29: 10 mg via INTRAVENOUS

## 2017-04-29 MED ORDER — PHENYLEPHRINE HCL 10 MG/ML IJ SOLN
INTRAMUSCULAR | Status: DC | PRN
  Administered 2017-04-29 (×2): 100 ug via INTRAVENOUS

## 2017-04-29 MED ORDER — GENTAMICIN SULFATE 40 MG/ML IJ SOLN
70.0000 mg | Freq: Once | INTRAVENOUS | Status: DC
  Filled 2017-04-29: qty 1.75

## 2017-04-29 MED ORDER — VANCOMYCIN HCL 500 MG IV SOLR
500.0000 mg | Freq: Once | INTRAVENOUS | Status: DC
  Filled 2017-04-29: qty 500

## 2017-04-29 NOTE — Anesthesia Preprocedure Evaluation (Signed)
Anesthesia Evaluation  Patient identified by MRN, date of birth, ID band Patient awake    Reviewed: Allergy & Precautions, NPO status , Patient's Chart, lab work & pertinent test results  History of Anesthesia Complications Negative for: history of anesthetic complications  Airway Mallampati: II  TM Distance: >3 FB Neck ROM: Full    Dental no notable dental hx.    Pulmonary neg pulmonary ROS, neg sleep apnea, neg COPD,    breath sounds clear to auscultation- rhonchi (-) wheezing      Cardiovascular hypertension, Pt. on medications (-) CAD, (-) Past MI, (-) Cardiac Stents and (-) CABG  Rhythm:Regular Rate:Normal - Systolic murmurs and - Diastolic murmurs    Neuro/Psych PSYCHIATRIC DISORDERS Depression negative neurological ROS     GI/Hepatic negative GI ROS, Neg liver ROS,   Endo/Other  negative endocrine ROSneg diabetes  Renal/GU Renal disease: hx of nephrolithiasis.     Musculoskeletal  (+) Arthritis ,   Abdominal (+) - obese,   Peds  Hematology  (+) anemia ,   Anesthesia Other Findings Past Medical History: No date: Anemia No date: Arthritis No date: Cancer [facility])     Comment:  hx of melanoma in 1971, basal cell  No date: Cervical disc disease No date: Chronic kidney disease     Comment:  current uti 08/05/11  No date: Depression 01/2015: DVT (deep venous thrombosis) ([place])     Comment:  was Eliquis until 06/13/15-  No date: Dysrhythmia     Comment:  hx of heart racing - in past, "none  in years".  had a               stress test > 7 years No date: History of recurrent UTIs     Comment:  "bladder infection"  Had a PICC line in past No date: Hyperlipidemia No date: Hypertension No date: Melanoma ([place]) No date: Nephrolithiasis No date: Pulmonary emboli (HCC)     Comment:  bilateral 8/12 hospitalized  No date: Vitamin D deficiency   Reproductive/Obstetrics                              Anesthesia Physical Anesthesia Plan  ASA: II  Anesthesia Plan: General   Post-op Pain Management:    Induction: Intravenous  PONV Risk Score and Plan: 2 and Propofol infusion  Airway Management Planned: Natural Airway  Additional Equipment:   Intra-op Plan:   Post-operative Plan:   Informed Consent: I have reviewed the patients History and Physical, chart, labs and discussed the procedure including the risks, benefits and alternatives for the proposed anesthesia with the patient or authorized representative who has indicated his/her understanding and acceptance.   Dental advisory given  Plan Discussed with: CRNA and Anesthesiologist  Anesthesia Plan Comments:         Anesthesia Quick Evaluation

## 2017-04-29 NOTE — Anesthesia Postprocedure Evaluation (Signed)
Anesthesia Post Note  Patient: Anita Bailey  Procedure(s) Performed: COLONOSCOPY WITH PROPOFOL (N/A )  Patient location during evaluation: Endoscopy Anesthesia Type: General Level of consciousness: awake and alert and oriented Pain management: pain level controlled Vital Signs Assessment: post-procedure vital signs reviewed and stable Respiratory status: spontaneous breathing, nonlabored ventilation and respiratory function stable Cardiovascular status: blood pressure returned to baseline and stable Postop Assessment: no signs of nausea or vomiting Anesthetic complications: no     Last Vitals:  Vitals:   04/29/17 1100 04/29/17 1110  BP: 125/63 (!) 121/52  Pulse: (!) 59 (!) 57  Resp: 20 17  Temp:    SpO2: 97% 97%    Last Pain:  Vitals:   04/29/17 1040  TempSrc: Tympanic  PainSc:                  Dayshawn Irizarry

## 2017-04-29 NOTE — Anesthesia Post-op Follow-up Note (Signed)
Anesthesia QCDR form completed.        

## 2017-04-29 NOTE — Op Note (Signed)
[facility] Gastroenterology Patient Name: Anita Bailey Procedure Date: 04/29/2017 10:00 AM MRN: [id number] Account #: [id number] Date of Birth: 1934/11/08 Admit Type: Outpatient Age: 83 Room: [facility] ENDO ROOM 3 Gender: Female Note Status: Finalized Procedure:            Colonoscopy Indications:          Screening for colorectal malignant neoplasm Providers:            [name], MD Referring MD:         Rusty Aus, MD (Referring MD) Medicines:            Propofol per Anesthesia Complications:        No immediate complications. Procedure:            Pre-Anesthesia Assessment:                       - After reviewing the risks and benefits, the patient                        was deemed in satisfactory condition to undergo the                        procedure.                       After obtaining informed consent, the colonoscope was                        passed under direct vision. Throughout the procedure,                        the patient's blood pressure, pulse, and oxygen                        saturations were monitored continuously. The                        Colonoscope was introduced through the anus and                        advanced to the the cecum, identified by appendiceal                        orifice and ileocecal valve. The colonoscopy was                        performed without difficulty. The patient tolerated the                        procedure well. The quality of the bowel preparation                        was excellent. Findings:      A small polyp was found in the descending colon. The polyp was sessile.       The polyp was removed with a hot snare. Resection and retrieval were       complete. To prevent bleeding after the polypectomy, one hemostatic clip       was successfully placed. There was no bleeding during, or at the end, of       the procedure.  A diminutive polyp was found in the hepatic flexure. The polyp was        sessile. The polyp was removed with a jumbo cold forceps. Resection and       retrieval were complete.      Two sessile polyps were found in the transverse colon. The polyps were       diminutive in size. These polyps were removed with a jumbo cold forceps.       Resection and retrieval were complete.      Internal hemorrhoids were found during endoscopy. The hemorrhoids were       small and Grade I (internal hemorrhoids that do not prolapse).      The exam was otherwise without abnormality. Impression:           - One small polyp in the descending colon, removed with                        a hot snare. Resected and retrieved. Clip was placed.                       - One diminutive polyp at the hepatic flexure, removed                        with a jumbo cold forceps. Resected and retrieved.                       - Two diminutive polyps in the transverse colon,                        removed with a jumbo cold forceps. Resected and                        retrieved.                       - Internal hemorrhoids.                       - The examination was otherwise normal. Recommendation:       - Await pathology results. [name], MD 04/29/2017 10:40:20 AM This report has been signed electronically. Number of Addenda: 0 Note Initiated On: 04/29/2017 10:00 AM Scope Withdrawal Time: 0 hours 14 minutes 54 seconds  Total Procedure Duration: 0 hours 18 minutes 39 seconds       [facility]

## 2017-04-29 NOTE — H&P (Signed)
Primary Care Physician:  Rusty Aus, MD Primary Gastroenterologist:  Dr. Vira Agar  Pre-Procedure History & Physical: HPI:  Anita Bailey is a 83 y.o. female is here for an colonoscopy.  Done for [facility] colon polyps.   Past Medical History:  Diagnosis Date  . Anemia   . Arthritis   . Cancer ([place])    hx of melanoma in 1971, basal cell   . Cervical disc disease   . Chronic kidney disease    current uti 08/05/11   . Depression   . DVT (deep venous thrombosis) ([place]) 01/2015   was Eliquis until 06/13/15-   . Dysrhythmia    hx of heart racing - in past, "none  in years".  had a stress test > 7 years  . History of recurrent UTIs    "bladder infection"  Had a PICC line in past  . Hyperlipidemia   . Hypertension   . Melanoma ([place])   . Nephrolithiasis   . Pulmonary emboli ([facility])    bilateral 8/12 hospitalized   . Vitamin D deficiency     Past Surgical History:  Procedure Laterality Date  . ABDOMINAL HYSTERECTOMY     1981  . BACK SURGERY    . BREAST EXCISIONAL BIOPSY Right 1979   Negative  . COLONOSCOPY W/ POLYPECTOMY    . left wrist      surgery x 2   . OPEN REDUCTION INTERNAL FIXATION (ORIF) DISTAL RADIAL FRACTURE Right 06/30/2015   Procedure: OPEN REDUCTION INTERNAL FIXATION (ORIF) RIGHT DISTAL RADIUS FRACTURE ;  Surgeon: [name], MD;  Location: [place];  Service: Orthopedics;  Laterality: Right;  . OTHER SURGICAL HISTORY Right    leg - melonoma  . TOTAL KNEE ARTHROPLASTY  08/11/2011   Procedure: TOTAL KNEE ARTHROPLASTY;  Surgeon: [name], MD;  Location: [facility];  Service: Orthopedics;  Laterality: Left;  . TOTAL KNEE ARTHROPLASTY Left 2010    Prior to Admission medications   Medication Sig Start Date End Date Taking? Authorizing Provider  Ascorbic Acid (VITAMIN C) 1000 MG tablet Take 1,000 mg by mouth daily.   Yes [provider]  Calcium Carbonate-Vitamin D (CALTRATE 600+D PO) Take 200 Units by mouth.   Yes [provider]  Cholecalciferol 2000  units TABS Take 2,000 Units by mouth.   Yes [provider]  LYRICA 50 MG capsule Take 50 mg by mouth 2 (two) times daily. 04/10/15  Yes [provider]  metoprolol succinate (TOPROL-XL) 50 MG 24 hr tablet Take 50 mg by mouth 2 (two) times daily. Take with or immediately following a meal.   Yes [provider]  venlafaxine XR (EFFEXOR-XR) 75 MG 24 hr capsule Take 75 mg by mouth daily. 06/05/15  Yes [provider]  acetaminophen (TYLENOL) 500 MG tablet Take 1,000 mg by mouth every 6 (six) hours as needed (For pain.).    [provider]  cyanocobalamin (,VITAMIN B-12,) 1000 MCG/ML injection Inject 1,000 mcg into the muscle every 30 (thirty) days.    [provider]  diclofenac sodium (VOLTAREN) 1 % GEL Apply 2 g topically 2 (two) times daily as needed (For pain.).  05/07/15   [provider]  HYDROcodone-acetaminophen (NORCO/VICODIN) 5-325 MG tablet TK 1 T PO Q 6 H PRF PAIN 05/15/16   [provider]  Vitamin D, Ergocalciferol, (DRISDOL) 50000 units CAPS capsule Take 50,000 Units by mouth every Saturday.  06/12/15   [provider]  zolpidem (AMBIEN) 5 MG tablet Take 2.5-5  mg by mouth at bedtime.     [provider]    Allergies as of 04/27/2017 - Review Complete 12/19/2016  Allergen Reaction Noted  . Penicillins Anaphylaxis and Other (See Comments) 07/31/2011  . Monosodium glutamate Other (See Comments) 06/26/2015  . Sulfa antibiotics Other (See Comments) 01/28/2015  . Caffeine Swelling 01/28/2015  . Celebrex [celecoxib] Rash 01/28/2015  . Clindamycin/lincomycin Rash 07/31/2011  . Diclofenac Rash 07/31/2011  . Latex Rash 01/28/2015  . Macrobid [nitrofurantoin monohyd macro] Rash 01/28/2015    Family History  Problem Relation Age of Onset  . Breast cancer Cousin     Social History   Socioeconomic History  . Marital status: Widowed    Spouse name: Not on file  . Number of children: Not on file  .  Years of education: Not on file  . Highest education level: Not on file  Occupational History  . Not on file  Social Needs  . Financial resource strain: Not on file  . Food insecurity:    Worry: Not on file    Inability: Not on file  . Transportation needs:    Medical: Not on file    Non-medical: Not on file  Tobacco Use  . Smoking status: Never Smoker  . Smokeless tobacco: Never Used  Substance and Sexual Activity  . Alcohol use: Yes    Alcohol/week: 0.6 oz    Types: 1 Glasses of wine per week  . Drug use: No  . Sexual activity: Not on file  Lifestyle  . Physical activity:    Days per week: Not on file    Minutes per session: Not on file  . Stress: Not on file  Relationships  . Social connections:    Talks on phone: Not on file    Gets together: Not on file    Attends religious service: Not on file    Active member of club or organization: Not on file    Attends meetings of clubs or organizations: Not on file    Relationship status: Not on file  . Intimate partner violence:    Fear of current or ex partner: Not on file    Emotionally abused: Not on file    Physically abused: Not on file    Forced sexual activity: Not on file  Other Topics Concern  . Not on file  Social History Narrative  . Not on file    Review of Systems: See HPI, otherwise negative ROS  Physical Exam: BP 119/60   Pulse (!) 58   Temp 98.1 F (36.7 C) (Tympanic)   Resp 20   Ht 5\' 5"  (1.651 m)   Wt 68 kg (150 lb)   SpO2 100%   BMI 24.96 kg/m  General:   Alert,  pleasant and cooperative in NAD Head:  Normocephalic and atraumatic. Neck:  Supple; no masses or thyromegaly. Lungs:  Clear throughout to auscultation.    Heart:  Regular rate and rhythm. Abdomen:  Soft, nontender and nondistended. Normal bowel sounds, without guarding, and without rebound.   Neurologic:  Alert and  oriented x4;  grossly normal neurologically.  Impression/Plan: Anita Bailey is here for an colonoscopy to be  performed for [facility] colon polyps.  Risks, benefits, limitations, and alternatives regarding  colonoscopy have been reviewed with the patient.  Questions have been answered.  All parties agreeable.   Gaylyn Cheers, MD  04/29/2017, 10:10 AM

## 2017-04-29 NOTE — Transfer of Care (Signed)
Immediate Anesthesia Transfer of Care Note  Patient: Anita Bailey  Procedure(s) Performed: COLONOSCOPY WITH PROPOFOL (N/A )  Patient Location: PACU and Endoscopy Unit  Anesthesia Type:General  Level of Consciousness: awake and patient cooperative  Airway & Oxygen Therapy: Patient Spontanous Breathing and Patient connected to nasal cannula oxygen  Post-op Assessment: Report given to RN and Post -op Vital signs reviewed and stable  Post vital signs: Reviewed and stable  Last Vitals:  Vitals Value Taken Time  BP    Temp    Pulse 66 04/29/2017 10:42 AM  Resp 13 04/29/2017 10:42 AM  SpO2 98 % 04/29/2017 10:42 AM  Vitals shown include unvalidated device data.  Last Pain:  Vitals:   04/29/17 0823  TempSrc: Tympanic  PainSc: 0-No pain         Complications: No apparent anesthesia complications

## 2017-05-01 LAB — SURGICAL PATHOLOGY

## 2017-05-05 ENCOUNTER — Ambulatory Visit
Admission: RE | Admit: 2017-05-05 | Discharge: 2017-05-05 | Disposition: A | Discharge status: Home / Self Care | Payer: Medicare Other | Source: Ambulatory Visit | Attending: Specialist | Admitting: Specialist

## 2017-05-05 DIAGNOSIS — M48061 Spinal stenosis, lumbar region without neurogenic claudication: Secondary | ICD-10-CM

## 2017-05-05 MED ORDER — METHYLPREDNISOLONE ACETATE 40 MG/ML INJ SUSP (RADIOLOG
120.0000 mg | Freq: Once | INTRAMUSCULAR | Status: AC
  Administered 2017-05-05: 120 mg via EPIDURAL

## 2017-05-05 MED ORDER — IOPAMIDOL (ISOVUE-M 200) INJECTION 41%
1.0000 mL | Freq: Once | INTRAMUSCULAR | Status: AC
  Administered 2017-05-05: 1 mL via EPIDURAL

## 2017-05-05 NOTE — Discharge Instructions (Signed)

## 2017-05-13 DIAGNOSIS — M5416 Radiculopathy, lumbar region: Secondary | ICD-10-CM | POA: Insufficient documentation

## 2017-05-13 DIAGNOSIS — M48061 Spinal stenosis, lumbar region without neurogenic claudication: Secondary | ICD-10-CM | POA: Insufficient documentation

## 2017-05-13 DIAGNOSIS — G5780 Other specified mononeuropathies of unspecified lower limb: Secondary | ICD-10-CM | POA: Insufficient documentation

## 2017-05-13 DIAGNOSIS — M4310 Spondylolisthesis, site unspecified: Secondary | ICD-10-CM | POA: Insufficient documentation

## 2017-05-27 DIAGNOSIS — D369 Benign neoplasm, unspecified site: Secondary | ICD-10-CM | POA: Insufficient documentation

## 2017-07-01 ENCOUNTER — Other Ambulatory Visit: Payer: Self-pay | Admitting: Specialist

## 2017-07-01 ENCOUNTER — Ambulatory Visit (INDEPENDENT_AMBULATORY_CARE_PROVIDER_SITE_OTHER): Payer: Medicare Other | Admitting: Urology

## 2017-07-01 ENCOUNTER — Encounter: Payer: Self-pay | Admitting: Urology

## 2017-07-01 VITALS — BP 138/76 | HR 52 | Ht 65.0 in | Wt 154.6 lb

## 2017-07-01 DIAGNOSIS — I701 Atherosclerosis of renal artery: Secondary | ICD-10-CM | POA: Diagnosis not present

## 2017-07-01 DIAGNOSIS — M48061 Spinal stenosis, lumbar region without neurogenic claudication: Secondary | ICD-10-CM

## 2017-07-01 NOTE — Progress Notes (Signed)
07/01/2017 10:49 AM   Anita Bailey 08/22/1934 [id number]  Referring provider: Rusty Aus, MD [facility] [place], [place] [address]  Chief Complaint  Patient presents with  . Kidney Cyst    HPI: 83 year old female presents for evaluation of a renal cyst and recurrent UTIs.  She is followed by Dr. [name] and states a renal ultrasound was discovered on ultrasound.  I do not have the report or images for review however she did have an abdominal ultrasound performed in 2013 which showed a 5.3 x 6.2 x 6.7 left upper pole renal cyst.  She denies flank or abdominal pain.  She does have left renal atrophy and states she has an ultrasound performed in Dr. Nino Parsley office every 6 months.    She also has 2-3 UTIs yearly with symptoms consisting of urinary frequency, urgency and dysuria.  The symptoms resolve with antibiotic therapy.  She has had urinalysis that were nitrite positive and have shown pyuria.  She denies gross hematuria.  There is no history of diverticulitis or pneumaturia.   PMH: Past Medical History:  Diagnosis Date  . Anemia   . Arthritis   . Cancer ([place])    hx of melanoma in 1971, basal cell   . Cervical disc disease   . Chronic kidney disease    current uti 08/05/11   . Depression   . DVT (deep venous thrombosis) ([place]) 01/2015   was Eliquis until 06/13/15-   . Dysrhythmia    hx of heart racing - in past, "none  in years".  had a stress test > 7 years  . History of recurrent UTIs    "bladder infection"  Had a PICC line in past  . Hyperlipidemia   . Hypertension   . Melanoma ([place])   . Nephrolithiasis   . Pulmonary emboli ([facility])    bilateral 8/12 hospitalized   . Vitamin D deficiency     Surgical History: Past Surgical History:  Procedure Laterality Date  . ABDOMINAL HYSTERECTOMY     1981  . BACK SURGERY    . BREAST EXCISIONAL BIOPSY Right 1979   Negative  . COLONOSCOPY W/ POLYPECTOMY    . COLONOSCOPY WITH  PROPOFOL N/A 04/29/2017   Procedure: COLONOSCOPY WITH PROPOFOL;  Surgeon: [name], MD;  Location: [facility];  Service: Endoscopy;  Laterality: N/A;  . left wrist      surgery x 2   . OPEN REDUCTION INTERNAL FIXATION (ORIF) DISTAL RADIAL FRACTURE Right 06/30/2015   Procedure: OPEN REDUCTION INTERNAL FIXATION (ORIF) RIGHT DISTAL RADIUS FRACTURE ;  Surgeon: [name], MD;  Location: [place];  Service: Orthopedics;  Laterality: Right;  . OTHER SURGICAL HISTORY Right    leg - melonoma  . TOTAL KNEE ARTHROPLASTY  08/11/2011   Procedure: TOTAL KNEE ARTHROPLASTY;  Surgeon: [name], MD;  [facility];  Service: Orthopedics;  Laterality: Left;  . TOTAL KNEE ARTHROPLASTY Left 2010    Home Medications:  Allergies as of 07/01/2017      Reactions   Latex Rash   Blisters   Penicillins Anaphylaxis, Other (See Comments)   Has patient had a PCN reaction causing immediate rash, facial/tongue/throat swelling, SOB or lightheadedness with hypotension: yes Has patient had a PCN reaction causing severe rash involving mucus membranes or skin necrosis no Has patient had a PCN reaction that required hospitalization no Has patient had a PCN reaction occurring within the last 10 years: no If all of the  above answers are "NO", then may proceed with Cephalosporin use.   Sulfa Antibiotics Other (See Comments), Itching, Rash   "pass out" Other reaction(s): Other (See Comments), Syncope Is able to take some sulfa abx per pt. But allergic to most syncope   Monosodium Glutamate Other (See Comments)   Migraine headache   Other Other (See Comments)   "pass out"   Glutamic Acid    Tape Itching   Redness,swelling,itching,burning   Caffeine Swelling   Celebrex [celecoxib] Rash   Clindamycin/lincomycin Rash   Diclofenac Rash   Macrobid [nitrofurantoin Monohyd Macro] Rash      Medication List        Accurate as of 07/01/17 10:49 AM. Always use your most recent med list.            acetaminophen 500 MG tablet Commonly known as:  TYLENOL Take 1,000 mg by mouth every 6 (six) hours as needed (For pain.).   CALTRATE 600+D PO Take 200 Units by mouth.   cyanocobalamin 1000 MCG/ML injection Commonly known as:  (VITAMIN B-12) Inject 1,000 mcg into the muscle every 30 (thirty) days.   diclofenac sodium 1 % Gel Commonly known as:  VOLTAREN Apply 2 g topically 2 (two) times daily as needed (For pain.).   HYDROcodone-acetaminophen 5-325 MG tablet Commonly known as:  NORCO/VICODIN TK 1 T PO Q 6 H PRF PAIN   LYRICA 50 MG capsule Generic drug:  pregabalin Take 50 mg by mouth 2 (two) times daily.   metoprolol succinate 50 MG 24 hr tablet Commonly known as:  TOPROL-XL Take 50 mg by mouth 2 (two) times daily. Take with or immediately following a meal.   venlafaxine XR 75 MG 24 hr capsule Commonly known as:  EFFEXOR-XR Take 75 mg by mouth daily.   vitamin C 1000 MG tablet Take 1,000 mg by mouth daily.   Vitamin D (Ergocalciferol) 50000 units Caps capsule Commonly known as:  DRISDOL Take 50,000 Units by mouth every Saturday.   zolpidem 5 MG tablet Commonly known as:  AMBIEN Take 2.5-5 mg by mouth at bedtime.       Allergies:  Allergies  Allergen Reactions  . Latex Rash    Blisters   . Penicillins Anaphylaxis and Other (See Comments)    Has patient had a PCN reaction causing immediate rash, facial/tongue/throat swelling, SOB or lightheadedness with hypotension: yes Has patient had a PCN reaction causing severe rash involving mucus membranes or skin necrosis no Has patient had a PCN reaction that required hospitalization no Has patient had a PCN reaction occurring within the last 10 years: no If all of the above answers are "NO", then may proceed with Cephalosporin use.   . Sulfa Antibiotics Other (See Comments), Itching and Rash    "pass out" Other reaction(s): Other (See Comments), Syncope Is able to take some sulfa abx per pt. But allergic to  most syncope   . Monosodium Glutamate Other (See Comments)    Migraine headache  . Other Other (See Comments)    "pass out"  . Glutamic Acid   . Tape Itching    Redness,swelling,itching,burning  . Caffeine Swelling  . Celebrex [Celecoxib] Rash  . Clindamycin/Lincomycin Rash  . Diclofenac Rash  . Macrobid [Nitrofurantoin Monohyd Macro] Rash    Family History: Family History  Problem Relation Age of Onset  . Breast cancer Cousin     Social History:  reports that she has never smoked. She has never used smokeless tobacco. She reports that she drinks about  0.6 oz of alcohol per week. She reports that she does not use drugs.  ROS: UROLOGY Frequent Urination?: Yes Hard to postpone urination?: Yes Burning/pain with urination?: No Get up at night to urinate?: Yes Leakage of urine?: Yes Urine stream starts and stops?: Yes Trouble starting stream?: Yes Do you have to strain to urinate?: No Blood in urine?: No Urinary tract infection?: No Sexually transmitted disease?: No Injury to kidneys or bladder?: No Painful intercourse?: No Weak stream?: No Currently pregnant?: No Vaginal bleeding?: No Last menstrual period?: 1981  Gastrointestinal Nausea?: No Vomiting?: No Indigestion/heartburn?: Yes Diarrhea?: No Constipation?: No  Constitutional Fever: No Night sweats?: No Weight loss?: No Fatigue?: No  Skin Skin rash/lesions?: No Itching?: No  Eyes Blurred vision?: No Double vision?: No  Ears/Nose/Throat Sore throat?: No Sinus problems?: No  Hematologic/Lymphatic Swollen glands?: No Easy bruising?: No  Cardiovascular Leg swelling?: No Chest pain?: No  Respiratory Cough?: No Shortness of breath?: No  Endocrine Excessive thirst?: No  Musculoskeletal Back pain?: No Joint pain?: No  Neurological Headaches?: No Dizziness?: No  Psychologic Depression?: No Anxiety?: No  Physical Exam: BP 138/76 (BP Location: Left Arm, Patient Position: Sitting,  Cuff Size: Large)   Pulse (!) 52   Ht 5\' 5"  (1.651 m)   Wt 154 lb 9.6 oz (70.1 kg)   SpO2 99%   BMI 25.73 kg/m   Constitutional:  Alert and oriented, No acute distress. HEENT: [place] AT, moist mucus membranes.  Trachea midline, no masses. Cardiovascular: No clubbing, cyanosis, or edema. Respiratory: Normal respiratory effort, no increased work of breathing. GI: Abdomen is soft, nontender, nondistended, no abdominal masses GU: No CVA tenderness Lymph: No cervical or inguinal lymphadenopathy. Skin: No rashes, bruises or suspicious lesions. Neurologic: Grossly intact, no focal deficits, moving all 4 extremities. Psychiatric: Normal mood and affect.  Laboratory Data:   Urinalysis Microscopy: 11-30 WBC   Assessment & Plan:   83 year old female with a renal cyst which is most likely simple.  Will obtain report from Dr. Nino Parsley office.  She was informed that no further evaluation or treatment would be needed.  This has been present at least since 2013.  We discussed that urinary tract infections in postmenopausal women are quite common.  Prevention guidelines including cranberry and low-dose vaginal estrogen were discussed.  She indicated she would like to start both Premarin and cranberry supplements.  She was given a sample of Premarin and Rx was sent to her pharmacy.  Urinalysis was nitrite positive with 11-30 WBCs however she is asymptomatic.  A urine culture was ordered however will not plan on treating unless she develops symptoms.   Return in about 6 months (around 12/31/2017) for Recheck.  Abbie Sons, [facility] [address] [place], [place] [address] [phone]

## 2017-07-02 ENCOUNTER — Other Ambulatory Visit: Payer: Self-pay

## 2017-07-03 ENCOUNTER — Encounter: Payer: Self-pay | Admitting: Urology

## 2017-07-05 LAB — CULTURE, URINE COMPREHENSIVE

## 2017-07-09 ENCOUNTER — Telehealth: Payer: Self-pay | Admitting: Urology

## 2017-07-09 NOTE — Telephone Encounter (Signed)
Pt is in [place], heading to [place] and wants to know if we can call her something in for urgency and frequency.  She saw Stoioff last week and would like to know if he [place]  [address]    [phone]

## 2017-07-13 NOTE — Telephone Encounter (Signed)
Please advise 

## 2017-07-13 NOTE — Telephone Encounter (Signed)
Her last urine culture was a multiresistance E.coli and the antibiotics that the organism is sensitive, she is allergic to.  It would be best if she sought treatment at a local facility.

## 2017-07-14 NOTE — Telephone Encounter (Signed)
Called pt no answer. LM for pt informing her of the information below. Strongly advised pt to f/u with provider locally.

## 2017-07-28 ENCOUNTER — Ambulatory Visit
Admission: RE | Admit: 2017-07-28 | Discharge: 2017-07-28 | Disposition: A | Discharge status: Home / Self Care | Payer: Medicare Other | Source: Ambulatory Visit | Attending: Specialist | Admitting: Specialist

## 2017-07-28 DIAGNOSIS — M48061 Spinal stenosis, lumbar region without neurogenic claudication: Secondary | ICD-10-CM

## 2017-07-28 MED ORDER — METHYLPREDNISOLONE ACETATE 40 MG/ML INJ SUSP (RADIOLOG
120.0000 mg | Freq: Once | INTRAMUSCULAR | Status: AC
  Administered 2017-07-28: 120 mg via EPIDURAL

## 2017-07-28 MED ORDER — IOPAMIDOL (ISOVUE-M 200) INJECTION 41%
1.0000 mL | Freq: Once | INTRAMUSCULAR | Status: AC
  Administered 2017-07-28: 1 mL via EPIDURAL

## 2017-07-28 NOTE — Discharge Instructions (Signed)

## 2017-07-30 ENCOUNTER — Other Ambulatory Visit (INDEPENDENT_AMBULATORY_CARE_PROVIDER_SITE_OTHER): Payer: Self-pay | Admitting: Vascular Surgery

## 2017-07-30 DIAGNOSIS — I129 Hypertensive chronic kidney disease with stage 1 through stage 4 chronic kidney disease, or unspecified chronic kidney disease: Secondary | ICD-10-CM

## 2017-07-31 ENCOUNTER — Telehealth (INDEPENDENT_AMBULATORY_CARE_PROVIDER_SITE_OTHER): Payer: Self-pay | Admitting: Vascular Surgery

## 2017-07-31 NOTE — Telephone Encounter (Signed)
LVM to confirm appt

## 2017-08-03 ENCOUNTER — Ambulatory Visit (INDEPENDENT_AMBULATORY_CARE_PROVIDER_SITE_OTHER): Payer: Medicare Other | Admitting: Vascular Surgery

## 2017-08-03 ENCOUNTER — Encounter (INDEPENDENT_AMBULATORY_CARE_PROVIDER_SITE_OTHER): Payer: Self-pay | Admitting: Vascular Surgery

## 2017-08-03 ENCOUNTER — Ambulatory Visit (INDEPENDENT_AMBULATORY_CARE_PROVIDER_SITE_OTHER): Payer: Medicare Other

## 2017-08-03 VITALS — BP 138/79 | HR 63 | Resp 13 | Ht 65.0 in | Wt 149.0 lb

## 2017-08-03 DIAGNOSIS — M1990 Unspecified osteoarthritis, unspecified site: Secondary | ICD-10-CM | POA: Diagnosis not present

## 2017-08-03 DIAGNOSIS — I10 Essential (primary) hypertension: Secondary | ICD-10-CM | POA: Diagnosis not present

## 2017-08-03 DIAGNOSIS — I129 Hypertensive chronic kidney disease with stage 1 through stage 4 chronic kidney disease, or unspecified chronic kidney disease: Secondary | ICD-10-CM

## 2017-08-03 DIAGNOSIS — I701 Atherosclerosis of renal artery: Secondary | ICD-10-CM | POA: Diagnosis not present

## 2017-08-03 NOTE — Progress Notes (Signed)
MRN : [id number]  Anita Bailey is a 83 y.o. (February 12, 1934) female who presents with chief complaint of No chief complaint on file. [name]  History of Present Illness:   Patient returns for vascular evaluation of renal artery stenosis. She reports doing well. Blood pressure is well-controlled. No issues with renal function.  The patient denies changes in claudication symptoms or new rest pain symptoms. No new ulcers or wounds of the foot.  The patient denies amaurosis fugax or recent TIA symptoms. There are no recent neurological changes noted. The patient denies history of DVT, PE or superficial thrombophlebitis. The patient denies recent episodes of angina or shortness of breath.  Renal artery duplex today shows greater than 50-60% stenosis of the right renal artery, high resistant waveforms present in the left renal artery suggesting intrinsic renal disease, atrophy of the left kidney. When compared to prior study no change in right renal artery velocities.    No outpatient medications have been marked as taking for the 08/03/17 encounter (Appointment) with [name], [name], MD.    Past Medical History:  Diagnosis Date  . Anemia   . Arthritis   . Cancer ([place])    hx of melanoma in 1971, basal cell   . Cervical disc disease   . Chronic kidney disease    current uti 08/05/11   . Depression   . DVT (deep venous thrombosis) ([place]) 01/2015   was Eliquis until 06/13/15-   . Dysrhythmia    hx of heart racing - in past, "none  in years".  had a stress test > 7 years  . History of recurrent UTIs    "bladder infection"  Had a PICC line in past  . Hyperlipidemia   . Hypertension   . Melanoma ([place])   . Nephrolithiasis   . Pulmonary emboli (HCC)    bilateral 8/12 hospitalized   . Vitamin D deficiency     Past Surgical History:  Procedure Laterality Date  . ABDOMINAL HYSTERECTOMY     1981  . BACK SURGERY    . BREAST EXCISIONAL BIOPSY Right 1979   Negative  . COLONOSCOPY W/  POLYPECTOMY    . COLONOSCOPY WITH PROPOFOL N/A 04/29/2017   Procedure: COLONOSCOPY WITH PROPOFOL;  Surgeon: [name], MD;  Location: [facility] ENDOSCOPY;  Service: Endoscopy;  Laterality: N/A;  . left wrist      surgery x 2   . OPEN REDUCTION INTERNAL FIXATION (ORIF) DISTAL RADIAL FRACTURE Right 06/30/2015   Procedure: OPEN REDUCTION INTERNAL FIXATION (ORIF) RIGHT DISTAL RADIUS FRACTURE ;  Surgeon: [name], MD;  Location: [place];  Service: Orthopedics;  Laterality: Right;  . OTHER SURGICAL HISTORY Right    leg - melonoma  . TOTAL KNEE ARTHROPLASTY  08/11/2011   Procedure: TOTAL KNEE ARTHROPLASTY;  Surgeon: [name], MD;  Location: [facility];  Service: Orthopedics;  Laterality: Left;  . TOTAL KNEE ARTHROPLASTY Left 2010    Social History Social History   Tobacco Use  . Smoking status: Never Smoker  . Smokeless tobacco: Never Used  Substance Use Topics  . Alcohol use: Yes    Alcohol/week: 0.6 oz    Types: 1 Glasses of wine per week  . Drug use: No    Family History Family History  Problem Relation Age of Onset  . Breast cancer Cousin     Allergies  Allergen Reactions  . Penicillins Anaphylaxis and Other (See Comments)    Has patient had a PCN reaction causing immediate rash,  facial/tongue/throat swelling, SOB or lightheadedness with hypotension: yes Has patient had a PCN reaction causing severe rash involving mucus membranes or skin necrosis no Has patient had a PCN reaction that required hospitalization no Has patient had a PCN reaction occurring within the last 10 years: no If all of the above answers are "NO", then may proceed with Cephalosporin use.   . Sulfa Antibiotics Other (See Comments), Itching and Rash    "pass out" Other reaction(s): Other (See Comments), Syncope Is able to take some sulfa abx per pt. But allergic to most syncope   . Latex Rash and Other (See Comments)    Blisters   . Monosodium Glutamate Other (See Comments)    Migraine  headache  . Glutamic Acid   . Caffeine Swelling  . Celebrex [Celecoxib] Rash  . Clindamycin/Lincomycin Rash  . Diclofenac Rash  . Macrobid [Nitrofurantoin Monohyd Macro] Rash  . Tape Itching, Swelling and Other (See Comments)    Redness, burning     REVIEW OF SYSTEMS (Negative unless checked)  Constitutional: [] Weight loss  [] Fever  [] Chills Cardiac: [] Chest pain   [] Chest pressure   [] Palpitations   [] Shortness of breath when laying flat   [] Shortness of breath with exertion. Vascular:  [] Pain in legs with walking   [] Pain in legs at rest  [] History of DVT   [] Phlebitis   [] Swelling in legs   [] Varicose veins   [] Non-healing ulcers Pulmonary:   [] Uses home oxygen   [] Productive cough   [] Hemoptysis   [] Wheeze  [] COPD   [] Asthma Neurologic:  [] Dizziness   [] Seizures   [] History of stroke   [] History of TIA  [] Aphasia   [] Vissual changes   [] Weakness or numbness in arm   [] Weakness or numbness in leg Musculoskeletal:   [] Joint swelling   [] Joint pain   [] Low back pain Hematologic:  [] Easy bruising  [] Easy bleeding   [] Hypercoagulable state   [] Anemic Gastrointestinal:  [] Diarrhea   [] Vomiting  [] Gastroesophageal reflux/heartburn   [] Difficulty swallowing. Genitourinary:  [] Chronic kidney disease   [] Difficult urination  [] Frequent urination   [] Blood in urine Skin:  [] Rashes   [] Ulcers  Psychological:  [] History of anxiety   []  History of major depression.  Physical Examination  There were no vitals filed for this visit. There is no height or weight on file to calculate BMI. Gen: WD/WN, NAD Head: [place]/AT, No temporalis wasting.  Ear/Nose/Throat: Hearing grossly intact, nares w/o erythema or drainage Eyes: PER, EOMI, sclera nonicteric.  Neck: Supple, no large masses.   Pulmonary:  Good air movement, no audible wheezing bilaterally, no use of accessory muscles.  Cardiac: RRR, no JVD Vascular:  Vessel Right Left  Radial Palpable Palpable  Ulnar Palpable Palpable  Brachial Palpable  Palpable  Gastrointestinal: Non-distended. No guarding/no peritoneal signs.  Musculoskeletal: M/S 5/5 throughout.  No deformity or atrophy.  Neurologic: CN 2-12 intact. Symmetrical.  Speech is fluent. Motor exam as listed above. Psychiatric: Judgment intact, Mood & affect appropriate for pt's clinical situation. Dermatologic: No rashes or ulcers noted.  No changes consistent with cellulitis. Lymph : No lichenification or skin changes of chronic lymphedema.  CBC Lab Results  Component Value Date   WBC 7.2 06/30/2015   HGB 12.5 06/30/2015   HCT 40.3 06/30/2015   MCV 95.0 06/30/2015   PLT 260 06/30/2015    BMET    Component Value Date/Time   NA 138 06/30/2015 1336   K 5.0 06/30/2015 1336   CL 106 06/30/2015 1336   CO2 28 06/30/2015  1336   GLUCOSE 140 (H) 06/30/2015 1336   BUN 17 06/30/2015 1336   CREATININE 0.84 06/30/2015 1336   CALCIUM 9.0 06/30/2015 1336   GFRNONAA >60 06/30/2015 1336   GFRAA >60 06/30/2015 1336   CrCl cannot be calculated (Patient's most recent lab result is older than the maximum 21 days allowed.).  COAG Lab Results  Component Value Date   INR 0.99 11/10/2008    Radiology Dg Inject Diag/thera/inc Needle/cath/plc Epi/lumb/sac W/img  Result Date: 07/28/2017 CLINICAL DATA:  Lumbosacral spondylosis without myelopathy. RIGHT-sided radicular symptoms predominate. FLUOROSCOPY TIME:  27 seconds corresponding to a Dose Area Product of 27.55 Gy*m2 PROCEDURE: The procedure, risks, benefits, and alternatives were explained to the patient. Questions regarding the procedure were encouraged and answered. The patient understands and consents to the procedure. LUMBAR EPIDURAL INJECTION: An interlaminar approach was performed on RIGHT at L3-L4. The overlying skin was cleansed and anesthetized. A 20 gauge epidural needle was advanced using loss-of-resistance technique. DIAGNOSTIC EPIDURAL INJECTION: Injection of Isovue-M 200 shows a good epidural pattern with spread above  and below the level of needle placement, primarily on the RIGHT; no vascular opacification is seen. THERAPEUTIC EPIDURAL INJECTION: 120.0 mg of Depo-Medrol mixed with 2 mL 1% lidocaine were instilled. The procedure was well-tolerated, and the patient was discharged thirty minutes following the injection in good condition. COMPLICATIONS: None. IMPRESSION: Technically successful epidural injection on the RIGHT L3-4 # 1, series 2. Electronically Signed   By: Staci Righter M.D.   On: 07/28/2017 12:30      Assessment/Plan 1. Renal artery stenosis (HCC) Duplex ultrasound shows a stable moderate stenosis there has been no loss of control of her hypertension.  Continue antihypertensive medications as already ordered, these medications have been reviewed and there are no changes at this time.  - VAS US RENAL ARTERY DUPLEX; Future  2. Essential hypertension Continue antihypertensive medications as already ordered, these medications have been reviewed and there are no changes at this time.   3. Osteoarthritis, unspecified osteoarthritis type, unspecified site Continue NSAID medications as already ordered, these medications have been reviewed and there are no changes at this time.  Continued activity and therapy was stressed.    Hortencia Pilar, MD  08/03/2017 8:47 AM

## 2017-09-18 NOTE — Progress Notes (Signed)
Please place orders in Epic as patient is being scheduled for a pre-op appointment! Thank you! 

## 2017-09-21 ENCOUNTER — Ambulatory Visit: Payer: Self-pay | Admitting: Orthopedic Surgery

## 2017-09-22 ENCOUNTER — Ambulatory Visit: Payer: Self-pay | Admitting: Orthopedic Surgery

## 2017-09-22 NOTE — H&P (Signed)
Anita Bailey is an 83 y.o. female.   Chief Complaint: Right shoulder pain HPI: Reason for Visit: Diagnositc Results (right shoulder MRI) Context: The patient is 5 weeks out from when symptoms began. Location (Upper Extremity): shoulder pain on the right Severity: pain level 10/10 (wiht movements)  Past Medical History:  Diagnosis Date  . Anemia   . Arthritis   . Cancer ([place])    hx of melanoma in 1971, basal cell   . Cervical disc disease   . Chronic kidney disease    current uti 08/05/11   . Depression   . DVT (deep venous thrombosis) ([place]) 01/2015   was Eliquis until 06/13/15-   . Dysrhythmia    hx of heart racing - in past, "none  in years".  had a stress test > 7 years  . History of recurrent UTIs    "bladder infection"  Had a PICC line in past  . Hyperlipidemia   . Hypertension   . Melanoma ([place])   . Nephrolithiasis   . Pulmonary emboli (HCC)    bilateral 8/12 hospitalized   . Vitamin D deficiency     Past Surgical History:  Procedure Laterality Date  . ABDOMINAL HYSTERECTOMY     1981  . BACK SURGERY    . BREAST EXCISIONAL BIOPSY Right 1979   Negative  . COLONOSCOPY W/ POLYPECTOMY    . COLONOSCOPY WITH PROPOFOL N/A 04/29/2017   Procedure: COLONOSCOPY WITH PROPOFOL;  Surgeon: [name], MD;  Location[facility] ENDOSCOPY;  Service: Endoscopy;  Laterality: N/A;  . left wrist      surgery x 2   . OPEN REDUCTION INTERNAL FIXATION (ORIF) DISTAL RADIAL FRACTURE Right 06/30/2015   Procedure: OPEN REDUCTION INTERNAL FIXATION (ORIF) RIGHT DISTAL RADIUS FRACTURE ;  Surgeon: [name], MD;  Location: [place];  Service: Orthopedics;  Laterality: Right;  . OTHER SURGICAL HISTORY Right    leg - melonoma  . TOTAL KNEE ARTHROPLASTY  08/11/2011   Procedure: TOTAL KNEE ARTHROPLASTY;  Surgeon: [name], MD;  Location: [facility];  Service: Orthopedics;  Laterality: Left;  . TOTAL KNEE ARTHROPLASTY Left 2010    Family History  Problem Relation Age of Onset  . Breast cancer  Cousin    Social History:  reports that she has never smoked. She has never used smokeless tobacco. She reports that she drinks about 1.0 standard drinks of alcohol per week. She reports that she does not use drugs.  Allergies:  Allergies  Allergen Reactions  . Penicillins Anaphylaxis and Other (See Comments)    Has patient had a PCN reaction causing immediate rash, facial/tongue/throat swelling, SOB or lightheadedness with hypotension: yes Has patient had a PCN reaction causing severe rash involving mucus membranes or skin necrosis no Has patient had a PCN reaction that required hospitalization no Has patient had a PCN reaction occurring within the last 10 years: no If all of the above answers are "NO", then may proceed with Cephalosporin use.   . Sulfa Antibiotics Other (See Comments), Itching and Rash    "pass out" Other reaction(s): Other (See Comments), Syncope Is able to take some sulfa abx per pt. But allergic to most syncope   . Latex Rash and Other (See Comments)    Blisters   . Monosodium Glutamate Other (See Comments)    Migraine headache  . Glutamic Acid   . Caffeine Swelling  . Celebrex [Celecoxib] Rash  . Clindamycin/Lincomycin Rash  . Diclofenac Rash  . Macrobid [employer] Macro]  Rash  . Tape Itching, Swelling and Other (See Comments)    Redness, burning   Medications: B12 Calcium 500 doxycycline hyclate 100 mg capsule metoprolol succinate ER 50 mg tablet,extended release 24 hr predniSONE 20 mg tablet predniSONE 5 mg tablet pregabalin 50 mg capsule triamcinolone acetonide 0.1 % topical cream triamcinolone acetonide 0.1 % topical ointment venlafaxine ER 75 mg capsule,extended release 24 hr Vitamin D-3 with Aloe zolpidem 5 mg tablet  Review of Systems  Constitutional: Negative.   HENT: Negative.   Eyes: Negative.   Respiratory: Negative.   Cardiovascular: Negative.   Gastrointestinal: Negative.   Genitourinary: Negative.    Musculoskeletal: Positive for joint pain.  Skin: Negative.   Neurological: Negative.   Psychiatric/Behavioral: Negative.    There were no vitals taken for this visit. Physical Exam  Constitutional: She is oriented to person, place, and time. She appears well-developed and well-nourished.  HENT:  Head: Normocephalic.  Eyes: Pupils are equal, round, and reactive to light.  Neck: Normal range of motion.  Cardiovascular: Normal rate.  Respiratory: Effort normal.  GI: Soft.  Musculoskeletal:  Patient is an 83 year old female.  Constitutional: General Appearance: healthy-appearing and NAD.  Psychiatric: Mood and Affect: normal mood and affect.  Cardiovascular System: Arterial Pulses Right: radial normal and brachial normal. Varicosities Right: no varicosities.  C-Spine/Neck: Active Range of Motion: flexion normal, extension normal, and no pain elicited on motion.  Shoulders: Inspection Right: no misalignment, atrophy, erythema, swelling, or scapular winging. Bony Palpation Right: no tenderness of the sternoclavicular joint, the coracoid process, the acromioclavicular joint, the bicipital groove, or the scapula. Soft Tissue Palpation Right: tenderness of the supraspinatus and the subacromial bursa. Active Range of Motion Right: limited. Special Tests Right: Speed's test negative and Neer's test positive. Stability Right: no laxity, sulcus sign negative, and anterior apprehension test negative. Strength Right: abduction 5/5, adduction 5/5, flexion 5/5, and extension 5/5.  Skin: Right Upper Extremity: normal.  Neurological System: Biceps Reflex Right: normal (2). Brachioradialis Reflex Right: normal (2). Triceps Reflex Right: normal (2). Sensation on the Right: C5 normal, C6 normal, and C7 normal.  Normal cervical lordosis. No pain with range of motion. No palpable tenderness. Motor is 5/5 in all groups in the upper extremities. Upper extremity sensory exam normal. Patient is normoreflexic  in the upper extremities. No Hoffmann sign.  Neurological: She is alert and oriented to person, place, and time.  Skin: Skin is warm and dry.    MRI demonstrates a full-thickness tear of the rotator cuff.  There is some retraction. 2.3 cm. There is tendinopathy of the biceps. Lateral sloping acromion.  Assessment/Plan Full thickness rotator cuff tear - Right  We discussed options living with her symptoms versus rotator cuff repair.  She would like to proceed with that.  An extensive discussion concerning the pathology relevant anatomy and treatment options. After that discussion we mutually agreed to proceed with repair of the rotator cuff utilizing arthroscopic assistance if possible. The risks and benefits of that procedure were discussed including bleeding, infection, suboptimal range of motion, deep venous thrombosis, pulmonary embolism, anesthetic complications etc. in addition we discussed the postoperative course to include approximately 4 weeks of passive range of motion followed by 4 weeks of active range of motion followed by 4-12 weeks of progressive strengthening exercises. In addition we discussed protective activities to reduce the risk of a reinjury including impingement activities with elbow above the shoulder as well as reaching and repetitive circular motion activities. The hospital stay will either be as  a outpatient with a regional block versus overnight depending upon the extent of the procedure and any challenging health issues with a first postoperative visit 2 weeks following the surgery.  Plan right shoulder mini-open RCR, possible patch graft  Cecilie Kicks., PA-C for Dr. Tonita Cong 09/22/2017, 9:14 AM

## 2017-09-22 NOTE — H&P (View-Only) (Signed)
Anita Bailey is an 83 y.o. female.   Chief Complaint: Right shoulder pain HPI: Reason for Visit: Diagnositc Results (right shoulder MRI) Context: The patient is 5 weeks out from when symptoms began. Location (Upper Extremity): shoulder pain on the right Severity: pain level 10/10 (wiht movements)  Past Medical History:  Diagnosis Date  . Anemia   . Arthritis   . Cancer ([place])    hx of melanoma in 1971, basal cell   . Cervical disc disease   . Chronic kidney disease    current uti 08/05/11   . Depression   . DVT (deep venous thrombosis) ([place]) 01/2015   was Eliquis until 06/13/15-   . Dysrhythmia    hx of heart racing - in past, "none  in years".  had a stress test > 7 years  . History of recurrent UTIs    "bladder infection"  Had a PICC line in past  . Hyperlipidemia   . Hypertension   . Melanoma ([place])   . Nephrolithiasis   . Pulmonary emboli ([facility])    bilateral 8/12 hospitalized   . Vitamin D deficiency     Past Surgical History:  Procedure Laterality Date  . ABDOMINAL HYSTERECTOMY     1981  . BACK SURGERY    . BREAST EXCISIONAL BIOPSY Right 1979   Negative  . COLONOSCOPY W/ POLYPECTOMY    . COLONOSCOPY WITH PROPOFOL N/A 04/29/2017   Procedure: COLONOSCOPY WITH PROPOFOL;  Surgeon: [name], MD;  Location: [facility] ENDOSCOPY;  Service: Endoscopy;  Laterality: N/A;  . left wrist      surgery x 2   . OPEN REDUCTION INTERNAL FIXATION (ORIF) DISTAL RADIAL FRACTURE Right 06/30/2015   Procedure: OPEN REDUCTION INTERNAL FIXATION (ORIF) RIGHT DISTAL RADIUS FRACTURE ;  Surgeon: [name], MD;  Location: [facility];  Service: Orthopedics;  Laterality: Right;  . OTHER SURGICAL HISTORY Right    leg - melonoma  . TOTAL KNEE ARTHROPLASTY  08/11/2011   Procedure: TOTAL KNEE ARTHROPLASTY;  Surgeon: [name], MD;  Location: [facility];  Service: Orthopedics;  Laterality: Left;  . TOTAL KNEE ARTHROPLASTY Left 2010    Family History  Problem Relation Age of Onset  . Breast cancer  Cousin    Social History:  reports that she has never smoked. She has never used smokeless tobacco. She reports that she drinks about 1.0 standard drinks of alcohol per week. She reports that she does not use drugs.  Allergies:  Allergies  Allergen Reactions  . Penicillins Anaphylaxis and Other (See Comments)    Has patient had a PCN reaction causing immediate rash, facial/tongue/throat swelling, SOB or lightheadedness with hypotension: yes Has patient had a PCN reaction causing severe rash involving mucus membranes or skin necrosis no Has patient had a PCN reaction that required hospitalization no Has patient had a PCN reaction occurring within the last 10 years: no If all of the above answers are "NO", then may proceed with Cephalosporin use.   . Sulfa Antibiotics Other (See Comments), Itching and Rash    "pass out" Other reaction(s): Other (See Comments), Syncope Is able to take some sulfa abx per pt. But allergic to most syncope   . Latex Rash and Other (See Comments)    Blisters   . Monosodium Glutamate Other (See Comments)    Migraine headache  . Glutamic Acid   . Caffeine Swelling  . Celebrex [Celecoxib] Rash  . Clindamycin/Lincomycin Rash  . Diclofenac Rash  . Macrobid [employer] Macro]  Rash  . Tape Itching, Swelling and Other (See Comments)    Redness, burning   Medications: B12 Calcium 500 doxycycline hyclate 100 mg capsule metoprolol succinate ER 50 mg tablet,extended release 24 hr predniSONE 20 mg tablet predniSONE 5 mg tablet pregabalin 50 mg capsule triamcinolone acetonide 0.1 % topical cream triamcinolone acetonide 0.1 % topical ointment venlafaxine ER 75 mg capsule,extended release 24 hr Vitamin D-3 with Aloe zolpidem 5 mg tablet  Review of Systems  Constitutional: Negative.   HENT: Negative.   Eyes: Negative.   Respiratory: Negative.   Cardiovascular: Negative.   Gastrointestinal: Negative.   Genitourinary: Negative.    Musculoskeletal: Positive for joint pain.  Skin: Negative.   Neurological: Negative.   Psychiatric/Behavioral: Negative.    There were no vitals taken for this visit. Physical Exam  Constitutional: She is oriented to person, place, and time. She appears well-developed and well-nourished.  HENT:  Head: Normocephalic.  Eyes: Pupils are equal, round, and reactive to light.  Neck: Normal range of motion.  Cardiovascular: Normal rate.  Respiratory: Effort normal.  GI: Soft.  Musculoskeletal:  Patient is an 83 year old female.  Constitutional: General Appearance: healthy-appearing and NAD.  Psychiatric: Mood and Affect: normal mood and affect.  Cardiovascular System: Arterial Pulses Right: radial normal and brachial normal. Varicosities Right: no varicosities.  C-Spine/Neck: Active Range of Motion: flexion normal, extension normal, and no pain elicited on motion.  Shoulders: Inspection Right: no misalignment, atrophy, erythema, swelling, or scapular winging. Bony Palpation Right: no tenderness of the sternoclavicular joint, the coracoid process, the acromioclavicular joint, the bicipital groove, or the scapula. Soft Tissue Palpation Right: tenderness of the supraspinatus and the subacromial bursa. Active Range of Motion Right: limited. Special Tests Right: Speed's test negative and Neer's test positive. Stability Right: no laxity, sulcus sign negative, and anterior apprehension test negative. Strength Right: abduction 5/5, adduction 5/5, flexion 5/5, and extension 5/5.  Skin: Right Upper Extremity: normal.  Neurological System: Biceps Reflex Right: normal (2). Brachioradialis Reflex Right: normal (2). Triceps Reflex Right: normal (2). Sensation on the Right: C5 normal, C6 normal, and C7 normal.  Normal cervical lordosis. No pain with range of motion. No palpable tenderness. Motor is 5/5 in all groups in the upper extremities. Upper extremity sensory exam normal. Patient is normoreflexic  in the upper extremities. No Hoffmann sign.  Neurological: She is alert and oriented to person, place, and time.  Skin: Skin is warm and dry.    MRI demonstrates a full-thickness tear of the rotator cuff.  There is some retraction. 2.3 cm. There is tendinopathy of the biceps. Lateral sloping acromion.  Assessment/Plan Full thickness rotator cuff tear - Right  We discussed options living with her symptoms versus rotator cuff repair.  She would like to proceed with that.  An extensive discussion concerning the pathology relevant anatomy and treatment options. After that discussion we mutually agreed to proceed with repair of the rotator cuff utilizing arthroscopic assistance if possible. The risks and benefits of that procedure were discussed including bleeding, infection, suboptimal range of motion, deep venous thrombosis, pulmonary embolism, anesthetic complications etc. in addition we discussed the postoperative course to include approximately 4 weeks of passive range of motion followed by 4 weeks of active range of motion followed by 4-12 weeks of progressive strengthening exercises. In addition we discussed protective activities to reduce the risk of a reinjury including impingement activities with elbow above the shoulder as well as reaching and repetitive circular motion activities. The hospital stay will either be as  a outpatient with a regional block versus overnight depending upon the extent of the procedure and any challenging health issues with a first postoperative visit 2 weeks following the surgery.  Plan right shoulder mini-open RCR, possible patch graft  Cecilie Kicks., PA-C for Dr. Tonita Cong 09/22/2017, 9:14 AM

## 2017-09-23 NOTE — Progress Notes (Signed)
VAS BI;ATERAL RENAL ARTERY [place] 08-03-17 EPIC

## 2017-09-23 NOTE — Patient Instructions (Signed)
Anita Bailey  09/23/2017   Your procedure is scheduled on: 10-02-17  Report to [facility] Main  Entrance  Report to admitting at 1115 AM    Call this number if you have problems the morning of surgery [phone]   Remember: Do not eat food:After Midnight.MAY HAVE CLEAR LIQUIDS FROM MIDNIGHT UNTIL 715 AM DAY OF SURGERY. NOTHING BY MOUTH AFTER 715 AM DAY OF SURGERY.    Take these medicines the morning of surgery with A SIP OF WATER: HYDROCODONE IF NEEDED, LYRICA, VENLAFAXINE XR (EFFEXOR XR), METOPROLOL SUCCINATE                               You may not have any metal on your body including hair pins and              piercings  Do not wear jewelry, make-up, lotions, powders or perfumes, deodorant             Do not wear nail polish.  Do not shave  48 hours prior to surgery.              Men may shave face and neck.   Do not bring valuables to the hospital. [place].  Contacts, dentures or bridgework may not be worn into surgery.  Leave suitcase in the car. After surgery it may be brought to your room.                  Please read over the following fact sheets you were given: _____________________________________________________________________             [facility] - Preparing for Surgery Before surgery, you can play an important role.  Because skin is not sterile, your skin needs to be as free of germs as possible.  You can reduce the number of germs on your skin by washing with CHG (chlorahexidine gluconate) soap before surgery.  CHG is an antiseptic cleaner which kills germs and bonds with the skin to continue killing germs even after washing. Please DO NOT use if you have an allergy to CHG or antibacterial soaps.  If your skin becomes reddened/irritated stop using the CHG and inform your nurse when you arrive at Short Stay. Do not shave (including legs and underarms) for at least 48 hours prior to the  first CHG shower.  You may shave your face/neck. Please follow these instructions carefully:  1.  Shower with CHG Soap the night before surgery and the  morning of Surgery.  2.  If you choose to wash your hair, wash your hair first as usual with your  normal  shampoo.  3.  After you shampoo, rinse your hair and body thoroughly to remove the  shampoo.                           4.  Use CHG as you would any other liquid soap.  You can apply chg directly  to the skin and wash                       Gently with a scrungie or clean washcloth.  5.  Apply the CHG Soap to your  body ONLY FROM THE NECK DOWN.   Do not use on face/ open                           Wound or open sores. Avoid contact with eyes, ears mouth and genitals (private parts).                       Wash face,  Genitals (private parts) with your normal soap.             6.  Wash thoroughly, paying special attention to the area where your surgery  will be performed.  7.  Thoroughly rinse your body with warm water from the neck down.  8.  DO NOT shower/wash with your normal soap after using and rinsing off  the CHG Soap.                9.  Pat yourself dry with a clean towel.            10.  Wear clean pajamas.            11.  Place clean sheets on your bed the night of your first shower and do not  sleep with pets. Day of Surgery : Do not apply any lotions/deodorants the morning of surgery.  Please wear clean clothes to the hospital/surgery center.  FAILURE TO FOLLOW THESE INSTRUCTIONS MAY RESULT IN THE CANCELLATION OF YOUR SURGERY PATIENT SIGNATURE_________________________________  NURSE SIGNATURE__________________________________  ________________________________________________________________________   Adam Phenix  An incentive spirometer is a tool that can help keep your lungs clear and active. This tool measures how well you are filling your lungs with each breath. Taking long deep breaths may help reverse or decrease  the chance of developing breathing (pulmonary) problems (especially infection) following:  A long period of time when you are unable to move or be active. BEFORE THE PROCEDURE   If the spirometer includes an indicator to show your best effort, your nurse or respiratory therapist will set it to a desired goal.  If possible, sit up straight or lean slightly forward. Try not to slouch.  Hold the incentive spirometer in an upright position. INSTRUCTIONS FOR USE  1. Sit on the edge of your bed if possible, or sit up as far as you can in bed or on a chair. 2. Hold the incentive spirometer in an upright position. 3. Breathe out normally. 4. Place the mouthpiece in your mouth and seal your lips tightly around it. 5. Breathe in slowly and as deeply as possible, raising the piston or the ball toward the top of the column. 6. Hold your breath for 3-5 seconds or for as long as possible. Allow the piston or ball to fall to the bottom of the column. 7. Remove the mouthpiece from your mouth and breathe out normally. 8. Rest for a few seconds and repeat Steps 1 through 7 at least 10 times every 1-2 hours when you are awake. Take your time and take a few normal breaths between deep breaths. 9. The spirometer may include an indicator to show your best effort. Use the indicator as a goal to work toward during each repetition. 10. After each set of 10 deep breaths, practice coughing to be sure your lungs are clear. If you have an incision (the cut made at the time of surgery), support your incision when coughing by placing a pillow or rolled up towels firmly against it.  Once you are able to get out of bed, walk around indoors and cough well. You may stop using the incentive spirometer when instructed by your caregiver.  RISKS AND COMPLICATIONS  Take your time so you do not get dizzy or light-headed.  If you are in pain, you may need to take or ask for pain medication before doing incentive spirometry. It is  harder to take a deep breath if you are having pain. AFTER USE  Rest and breathe slowly and easily.  It can be helpful to keep track of a log of your progress. Your caregiver can provide you with a simple table to help with this. If you are using the spirometer at home, follow these instructions: [place] IF:   You are having difficultly using the spirometer.  You have trouble using the spirometer as often as instructed.  Your pain medication is not giving enough relief while using the spirometer.  You develop fever of 100.5 F (38.1 C) or higher. SEEK IMMEDIATE MEDICAL CARE IF:   You cough up bloody sputum that had not been present before.  You develop fever of 102 F (38.9 C) or greater.  You develop worsening pain at or near the incision site. MAKE SURE YOU:   Understand these instructions.  Will watch your condition.  Will get help right away if you are not doing well or get worse. Document Released: 05/12/2006 Document Revised: 03/24/2011 Document Reviewed: 07/13/2006 [facility] Patient Information 2014 [place], [place].   ________________________________________________________________________

## 2017-09-24 ENCOUNTER — Inpatient Hospital Stay (HOSPITAL_COMMUNITY)
Admission: RE | Admit: 2017-09-24 | Discharge: 2017-09-24 | Disposition: A | Discharge status: Home / Self Care | Payer: Medicare Other | Source: Ambulatory Visit

## 2017-09-28 ENCOUNTER — Other Ambulatory Visit: Payer: Self-pay

## 2017-09-28 ENCOUNTER — Encounter (HOSPITAL_COMMUNITY): Payer: Self-pay

## 2017-09-28 ENCOUNTER — Encounter (HOSPITAL_COMMUNITY)
Admission: RE | Admit: 2017-09-28 | Discharge: 2017-09-28 | Disposition: A | Discharge status: Home / Self Care | Payer: Medicare Other | Source: Ambulatory Visit | Attending: Specialist | Admitting: Specialist

## 2017-09-28 DIAGNOSIS — F329 Major depressive disorder, single episode, unspecified: Secondary | ICD-10-CM | POA: Diagnosis not present

## 2017-09-28 DIAGNOSIS — Z01812 Encounter for preprocedural laboratory examination: Secondary | ICD-10-CM | POA: Insufficient documentation

## 2017-09-28 DIAGNOSIS — D649 Anemia, unspecified: Secondary | ICD-10-CM | POA: Diagnosis not present

## 2017-09-28 DIAGNOSIS — I10 Essential (primary) hypertension: Secondary | ICD-10-CM | POA: Diagnosis not present

## 2017-09-28 LAB — BASIC METABOLIC PANEL
Anion gap: 8 (ref 5–15)
BUN: 19 mg/dL (ref 8–23)
CHLORIDE: 107 mmol/L (ref 98–111)
CO2: 28 mmol/L (ref 22–32)
Calcium: 9.1 mg/dL (ref 8.9–10.3)
Creatinine, Ser: 0.92 mg/dL (ref 0.44–1.00)
GFR calc non Af Amer: 57 mL/min — ABNORMAL LOW (ref >60)
Glucose, Bld: 93 mg/dL (ref 70–99)
POTASSIUM: 4.7 mmol/L (ref 3.5–5.1)
SODIUM: 143 mmol/L (ref 135–145)

## 2017-09-28 LAB — CBC
HEMATOCRIT: 42.4 % (ref 36.0–46.0)
HEMOGLOBIN: 13.3 g/dL (ref 12.0–15.0)
MCH: 30.7 pg (ref 26.0–34.0)
MCHC: 31.4 g/dL (ref 30.0–36.0)
MCV: 97.9 fL (ref 78.0–100.0)
PLATELETS: 349 10*3/uL (ref 150–400)
RBC: 4.33 MIL/uL (ref 3.87–5.11)
RDW: 13.6 % (ref 11.5–15.5)
WBC: 6.5 10*3/uL (ref 4.0–10.5)

## 2017-09-28 NOTE — Patient Instructions (Addendum)
Anita Bailey  09/28/2017   Your procedure is scheduled on: Friday 10/02/2017  Report to [facility] Main  Entrance              Report to admitting at 1115  AM     Call this number if you have problems the morning of surgery [phone]   Remember: Do not eat food  :After Midnight.May have clear liquids from midnight up until 0715 am then nothing until after surgery!    CLEAR LIQUID DIET   Foods Allowed                                                                     Foods Excluded  Coffee and tea, regular and decaf                             liquids that you cannot  Plain Jell-O in any flavor                                             see through such as: Fruit ices (not with fruit pulp)                                     milk, soups, orange juice  Iced Popsicles                                    All solid food Carbonated beverages, regular and diet                                    Cranberry, grape and apple juices Sports drinks like Gatorade Lightly seasoned clear broth or consume(fat free) Sugar, honey syrup  Sample Menu Breakfast                                Lunch                                     Supper Cranberry juice                    Beef broth                            Chicken broth Jell-O                                     Grape juice  Apple juice Coffee or tea                        Jell-O                                      Popsicle                                                Coffee or tea                        Coffee or tea  _____________________________________________________________________    BRUSH YOUR TEETH MORNING OF SURGERY AND RINSE YOUR MOUTH OUT, NO CHEWING GUM CANDY OR MINTS.     Take these medicines the morning of surgery with A SIP OF WATER: Metoprolol succinate (Toprol-XL), Hydrocodone if needed, Lyrica, Venlafaxine (Effexor-XR),                                  You may  not have any metal on your body including hair pins and              piercings  Do not wear jewelry, make-up, lotions, powders or perfumes, deodorant             Do not wear nail polish.  Do not shave  48 hours prior to surgery.              Do not bring valuables to the hospital. [place].  Contacts, dentures or bridgework may not be worn into surgery.  Leave suitcase in the car. After surgery it may be brought to your room.                  Please read over the following fact sheets you were given: _____________________________________________________________________             [facility] - Preparing for Surgery Before surgery, you can play an important role.  Because skin is not sterile, your skin needs to be as free of germs as possible.  You can reduce the number of germs on your skin by washing with CHG (chlorahexidine gluconate) soap before surgery.  CHG is an antiseptic cleaner which kills germs and bonds with the skin to continue killing germs even after washing. Please DO NOT use if you have an allergy to CHG or antibacterial soaps.  If your skin becomes reddened/irritated stop using the CHG and inform your nurse when you arrive at Short Stay. Do not shave (including legs and underarms) for at least 48 hours prior to the first CHG shower.  You may shave your face/neck. Please follow these instructions carefully:  1.  Shower with CHG Soap the night before surgery and the  morning of Surgery.  2.  If you choose to wash your hair, wash your hair first as usual with your  normal  shampoo.  3.  After you shampoo, rinse your hair and body thoroughly to remove the  shampoo.  4.  Use CHG as you would any other liquid soap.  You can apply chg directly  to the skin and wash                       Gently with a scrungie or clean washcloth.  5.  Apply the CHG Soap to your body ONLY FROM THE NECK DOWN.   Do not use on  face/ open                           Wound or open sores. Avoid contact with eyes, ears mouth and genitals (private parts).                       Wash face,  Genitals (private parts) with your normal soap.             6.  Wash thoroughly, paying special attention to the area where your surgery  will be performed.  7.  Thoroughly rinse your body with warm water from the neck down.  8.  DO NOT shower/wash with your normal soap after using and rinsing off  the CHG Soap.                9.  Pat yourself dry with a clean towel.            10.  Wear clean pajamas.            11.  Place clean sheets on your bed the night of your first shower and do not  sleep with pets. Day of Surgery : Do not apply any lotions/deodorants the morning of surgery.  Please wear clean clothes to the hospital/surgery center.  FAILURE TO FOLLOW THESE INSTRUCTIONS MAY RESULT IN THE CANCELLATION OF YOUR SURGERY PATIENT SIGNATURE_________________________________  NURSE SIGNATURE__________________________________  ________________________________________________________________________   Adam Phenix  An incentive spirometer is a tool that can help keep your lungs clear and active. This tool measures how well you are filling your lungs with each breath. Taking long deep breaths may help reverse or decrease the chance of developing breathing (pulmonary) problems (especially infection) following:  A long period of time when you are unable to move or be active. BEFORE THE PROCEDURE   If the spirometer includes an indicator to show your best effort, your nurse or respiratory therapist will set it to a desired goal.  If possible, sit up straight or lean slightly forward. Try not to slouch.  Hold the incentive spirometer in an upright position. INSTRUCTIONS FOR USE  1. Sit on the edge of your bed if possible, or sit up as far as you can in bed or on a chair. 2. Hold the incentive spirometer in an upright  position. 3. Breathe out normally. 4. Place the mouthpiece in your mouth and seal your lips tightly around it. 5. Breathe in slowly and as deeply as possible, raising the piston or the ball toward the top of the column. 6. Hold your breath for 3-5 seconds or for as long as possible. Allow the piston or ball to fall to the bottom of the column. 7. Remove the mouthpiece from your mouth and breathe out normally. 8. Rest for a few seconds and repeat Steps 1 through 7 at least 10 times every 1-2 hours when you are awake. Take your time and take a few normal breaths between deep breaths. 9. The spirometer may include an indicator to  show your best effort. Use the indicator as a goal to work toward during each repetition. 10. After each set of 10 deep breaths, practice coughing to be sure your lungs are clear. If you have an incision (the cut made at the time of surgery), support your incision when coughing by placing a pillow or rolled up towels firmly against it. Once you are able to get out of bed, walk around indoors and cough well. You may stop using the incentive spirometer when instructed by your caregiver.  RISKS AND COMPLICATIONS  Take your time so you do not get dizzy or light-headed.  If you are in pain, you may need to take or ask for pain medication before doing incentive spirometry. It is harder to take a deep breath if you are having pain. AFTER USE  Rest and breathe slowly and easily.  It can be helpful to keep track of a log of your progress. Your caregiver can provide you with a simple table to help with this. If you are using the spirometer at home, follow these instructions: [place] IF:   You are having difficultly using the spirometer.  You have trouble using the spirometer as often as instructed.  Your pain medication is not giving enough relief while using the spirometer.  You develop fever of 100.5 F (38.1 C) or higher. SEEK IMMEDIATE MEDICAL CARE IF:    You cough up bloody sputum that had not been present before.  You develop fever of 102 F (38.9 C) or greater.  You develop worsening pain at or near the incision site. MAKE SURE YOU:   Understand these instructions.  Will watch your condition.  Will get help right away if you are not doing well or get worse. Document Released: 05/12/2006 Document Revised: 03/24/2011 Document Reviewed: 07/13/2006 [facility] Patient Information 2014 [place], [place].   ________________________________________________________________________

## 2017-09-28 NOTE — Progress Notes (Signed)
09/10/2017- EKG on chart from Dr. Sabra Heck at [facility]

## 2017-10-02 ENCOUNTER — Ambulatory Visit (HOSPITAL_COMMUNITY): Payer: Medicare Other | Admitting: Certified Registered"

## 2017-10-02 ENCOUNTER — Encounter (HOSPITAL_COMMUNITY): Payer: Self-pay

## 2017-10-02 ENCOUNTER — Other Ambulatory Visit: Payer: Self-pay

## 2017-10-02 ENCOUNTER — Encounter (HOSPITAL_COMMUNITY)
Admission: RE | Disposition: A | Discharge status: Home / Self Care | Payer: Self-pay | Source: Ambulatory Visit | Attending: Specialist

## 2017-10-02 ENCOUNTER — Ambulatory Visit (HOSPITAL_COMMUNITY)
Admission: RE | Admit: 2017-10-02 | Discharge: 2017-10-03 | Disposition: A | Discharge status: Home / Self Care | Payer: Medicare Other | Source: Ambulatory Visit | Attending: Specialist | Admitting: Specialist

## 2017-10-02 DIAGNOSIS — F329 Major depressive disorder, single episode, unspecified: Secondary | ICD-10-CM | POA: Insufficient documentation

## 2017-10-02 DIAGNOSIS — Z91040 Latex allergy status: Secondary | ICD-10-CM | POA: Insufficient documentation

## 2017-10-02 DIAGNOSIS — D649 Anemia, unspecified: Secondary | ICD-10-CM | POA: Insufficient documentation

## 2017-10-02 DIAGNOSIS — I499 Cardiac arrhythmia, unspecified: Secondary | ICD-10-CM | POA: Diagnosis not present

## 2017-10-02 DIAGNOSIS — Z87442 Personal history of urinary calculi: Secondary | ICD-10-CM | POA: Insufficient documentation

## 2017-10-02 DIAGNOSIS — Z90710 Acquired absence of both cervix and uterus: Secondary | ICD-10-CM | POA: Diagnosis not present

## 2017-10-02 DIAGNOSIS — Z85820 Personal history of malignant melanoma of skin: Secondary | ICD-10-CM | POA: Diagnosis not present

## 2017-10-02 DIAGNOSIS — Z803 Family history of malignant neoplasm of breast: Secondary | ICD-10-CM | POA: Diagnosis not present

## 2017-10-02 DIAGNOSIS — E559 Vitamin D deficiency, unspecified: Secondary | ICD-10-CM | POA: Insufficient documentation

## 2017-10-02 DIAGNOSIS — Z881 Allergy status to other antibiotic agents status: Secondary | ICD-10-CM | POA: Insufficient documentation

## 2017-10-02 DIAGNOSIS — Z880 Allergy status to penicillin: Secondary | ICD-10-CM | POA: Diagnosis not present

## 2017-10-02 DIAGNOSIS — M1990 Unspecified osteoarthritis, unspecified site: Secondary | ICD-10-CM | POA: Insufficient documentation

## 2017-10-02 DIAGNOSIS — N189 Chronic kidney disease, unspecified: Secondary | ICD-10-CM | POA: Insufficient documentation

## 2017-10-02 DIAGNOSIS — Z86711 Personal history of pulmonary embolism: Secondary | ICD-10-CM | POA: Diagnosis not present

## 2017-10-02 DIAGNOSIS — Z86718 Personal history of other venous thrombosis and embolism: Secondary | ICD-10-CM | POA: Insufficient documentation

## 2017-10-02 DIAGNOSIS — Z96652 Presence of left artificial knee joint: Secondary | ICD-10-CM | POA: Insufficient documentation

## 2017-10-02 DIAGNOSIS — Z79899 Other long term (current) drug therapy: Secondary | ICD-10-CM | POA: Diagnosis not present

## 2017-10-02 DIAGNOSIS — E785 Hyperlipidemia, unspecified: Secondary | ICD-10-CM | POA: Diagnosis not present

## 2017-10-02 DIAGNOSIS — Z86010 Personal history of colonic polyps: Secondary | ICD-10-CM | POA: Insufficient documentation

## 2017-10-02 DIAGNOSIS — Z888 Allergy status to other drugs, medicaments and biological substances status: Secondary | ICD-10-CM | POA: Insufficient documentation

## 2017-10-02 DIAGNOSIS — Z886 Allergy status to analgesic agent status: Secondary | ICD-10-CM | POA: Insufficient documentation

## 2017-10-02 DIAGNOSIS — I129 Hypertensive chronic kidney disease with stage 1 through stage 4 chronic kidney disease, or unspecified chronic kidney disease: Secondary | ICD-10-CM | POA: Insufficient documentation

## 2017-10-02 DIAGNOSIS — Z91048 Other nonmedicinal substance allergy status: Secondary | ICD-10-CM | POA: Insufficient documentation

## 2017-10-02 DIAGNOSIS — M75121 Complete rotator cuff tear or rupture of right shoulder, not specified as traumatic: Secondary | ICD-10-CM | POA: Insufficient documentation

## 2017-10-02 DIAGNOSIS — M75101 Unspecified rotator cuff tear or rupture of right shoulder, not specified as traumatic: Secondary | ICD-10-CM

## 2017-10-02 DIAGNOSIS — Z882 Allergy status to sulfonamides status: Secondary | ICD-10-CM | POA: Insufficient documentation

## 2017-10-02 DIAGNOSIS — M75120 Complete rotator cuff tear or rupture of unspecified shoulder, not specified as traumatic: Secondary | ICD-10-CM | POA: Diagnosis present

## 2017-10-02 HISTORY — PX: SHOULDER ARTHROSCOPY WITH OPEN ROTATOR CUFF REPAIR: SHX6092

## 2017-10-02 SURGERY — ARTHROSCOPY, SHOULDER WITH REPAIR, ROTATOR CUFF, OPEN
Anesthesia: General | Laterality: Right

## 2017-10-02 MED ORDER — PHENOL 1.4 % MT LIQD
1.0000 | OROMUCOSAL | Status: DC | PRN
  Filled 2017-10-02: qty 177

## 2017-10-02 MED ORDER — ROCURONIUM BROMIDE 10 MG/ML (PF) SYRINGE
PREFILLED_SYRINGE | INTRAVENOUS | Status: AC
  Filled 2017-10-02: qty 10

## 2017-10-02 MED ORDER — MENTHOL 3 MG MT LOZG
1.0000 | LOZENGE | OROMUCOSAL | Status: DC | PRN
  Filled 2017-10-02: qty 9

## 2017-10-02 MED ORDER — DEXAMETHASONE SODIUM PHOSPHATE 4 MG/ML IJ SOLN
INTRAMUSCULAR | Status: DC | PRN
  Administered 2017-10-02: 5 mg via INTRAVENOUS

## 2017-10-02 MED ORDER — VENLAFAXINE HCL ER 75 MG PO CP24
75.0000 mg | ORAL_CAPSULE | Freq: Every day | ORAL | Status: DC
  Administered 2017-10-03: 75 mg via ORAL
  Filled 2017-10-02: qty 1

## 2017-10-02 MED ORDER — HYDROCODONE-ACETAMINOPHEN 5-325 MG PO TABS
1.0000 | ORAL_TABLET | ORAL | Status: DC | PRN
  Administered 2017-10-02 (×2): 1 via ORAL
  Filled 2017-10-02 (×2): qty 1

## 2017-10-02 MED ORDER — DOCUSATE SODIUM 100 MG PO CAPS: 100.0000 mg | ORAL_CAPSULE | Freq: Two times a day (BID) | ORAL | 2 refills | Status: AC

## 2017-10-02 MED ORDER — DEXAMETHASONE SODIUM PHOSPHATE 10 MG/ML IJ SOLN
INTRAMUSCULAR | Status: AC
  Filled 2017-10-02: qty 1

## 2017-10-02 MED ORDER — RISAQUAD PO CAPS
1.0000 | ORAL_CAPSULE | Freq: Every day | ORAL | Status: DC
  Administered 2017-10-02 – 2017-10-03 (×2): 1 via ORAL
  Filled 2017-10-02 (×3): qty 1

## 2017-10-02 MED ORDER — ONDANSETRON HCL 4 MG/2ML IJ SOLN
INTRAMUSCULAR | Status: DC | PRN
  Administered 2017-10-02: 4 mg via INTRAVENOUS

## 2017-10-02 MED ORDER — MIDAZOLAM HCL 2 MG/2ML IJ SOLN
INTRAMUSCULAR | Status: AC
  Filled 2017-10-02: qty 2

## 2017-10-02 MED ORDER — DOCUSATE SODIUM 100 MG PO CAPS
100.0000 mg | ORAL_CAPSULE | Freq: Two times a day (BID) | ORAL | Status: DC
  Administered 2017-10-02 – 2017-10-03 (×2): 100 mg via ORAL
  Filled 2017-10-02 (×2): qty 1

## 2017-10-02 MED ORDER — SODIUM CHLORIDE 0.9 % IV SOLN
INTRAVENOUS | Status: DC | PRN
  Administered 2017-10-02: 20 ug/min via INTRAVENOUS

## 2017-10-02 MED ORDER — SODIUM CHLORIDE 0.9 % IV SOLN
INTRAVENOUS | Status: DC | PRN
  Administered 2017-10-02: 500 mL

## 2017-10-02 MED ORDER — ENOXAPARIN SODIUM 30 MG/0.3ML ~~LOC~~ SOLN
30.0000 mg | SUBCUTANEOUS | Status: DC
  Administered 2017-10-03: 30 mg via SUBCUTANEOUS
  Filled 2017-10-02: qty 0.3

## 2017-10-02 MED ORDER — HYDROCODONE-ACETAMINOPHEN 5-325 MG PO TABS: 1.0000 | ORAL_TABLET | Freq: Four times a day (QID) | ORAL | 0 refills | Status: DC | PRN

## 2017-10-02 MED ORDER — PROPOFOL 10 MG/ML IV BOLUS
INTRAVENOUS | Status: DC | PRN
  Administered 2017-10-02: 160 mg via INTRAVENOUS

## 2017-10-02 MED ORDER — BUPIVACAINE-EPINEPHRINE (PF) 0.5% -1:200000 IJ SOLN
INTRAMUSCULAR | Status: AC
  Filled 2017-10-02: qty 30

## 2017-10-02 MED ORDER — HYDROMORPHONE HCL 1 MG/ML IJ SOLN: 0.5000 mg | INTRAMUSCULAR | Status: DC | PRN

## 2017-10-02 MED ORDER — LIDOCAINE 2% (20 MG/ML) 5 ML SYRINGE
INTRAMUSCULAR | Status: AC
  Filled 2017-10-02: qty 5

## 2017-10-02 MED ORDER — ONDANSETRON HCL 4 MG/2ML IJ SOLN: 4.0000 mg | Freq: Four times a day (QID) | INTRAMUSCULAR | Status: DC | PRN

## 2017-10-02 MED ORDER — METHOCARBAMOL 500 MG IVPB - SIMPLE MED
500.0000 mg | Freq: Four times a day (QID) | INTRAVENOUS | Status: DC | PRN
  Filled 2017-10-02: qty 50

## 2017-10-02 MED ORDER — PREGABALIN 50 MG PO CAPS
50.0000 mg | ORAL_CAPSULE | Freq: Two times a day (BID) | ORAL | Status: DC
  Administered 2017-10-02 – 2017-10-03 (×2): 50 mg via ORAL
  Filled 2017-10-02 (×2): qty 1

## 2017-10-02 MED ORDER — PROPOFOL 10 MG/ML IV BOLUS
INTRAVENOUS | Status: AC
  Filled 2017-10-02: qty 40

## 2017-10-02 MED ORDER — ZOLPIDEM TARTRATE 5 MG PO TABS
2.5000 mg | ORAL_TABLET | Freq: Every day | ORAL | Status: DC
  Administered 2017-10-02: 5 mg via ORAL
  Filled 2017-10-02: qty 1

## 2017-10-02 MED ORDER — ONDANSETRON HCL 4 MG PO TABS: 4.0000 mg | ORAL_TABLET | Freq: Four times a day (QID) | ORAL | Status: DC | PRN

## 2017-10-02 MED ORDER — DIPHENHYDRAMINE HCL 12.5 MG/5ML PO ELIX: 12.5000 mg | ORAL_SOLUTION | ORAL | Status: DC | PRN

## 2017-10-02 MED ORDER — EPHEDRINE 5 MG/ML INJ
INTRAVENOUS | Status: AC
  Filled 2017-10-02: qty 10

## 2017-10-02 MED ORDER — POLYETHYLENE GLYCOL 3350 17 G PO PACK: 17.0000 g | PACK | Freq: Every day | ORAL | Status: DC | PRN

## 2017-10-02 MED ORDER — PHENYLEPHRINE HCL-NACL 10-0.9 MG/250ML-% IV SOLN
INTRAVENOUS | Status: AC
  Filled 2017-10-02: qty 250

## 2017-10-02 MED ORDER — LACTATED RINGERS IV SOLN
INTRAVENOUS | Status: DC
  Administered 2017-10-02: 12:00:00 via INTRAVENOUS

## 2017-10-02 MED ORDER — VANCOMYCIN HCL IN DEXTROSE 1-5 GM/200ML-% IV SOLN
1000.0000 mg | INTRAVENOUS | Status: AC
  Administered 2017-10-02: 1000 mg via INTRAVENOUS
  Filled 2017-10-02: qty 200

## 2017-10-02 MED ORDER — METHOCARBAMOL 500 MG PO TABS
500.0000 mg | ORAL_TABLET | Freq: Four times a day (QID) | ORAL | Status: DC | PRN
  Administered 2017-10-02: 500 mg via ORAL
  Filled 2017-10-02: qty 1

## 2017-10-02 MED ORDER — METOCLOPRAMIDE HCL 5 MG/ML IJ SOLN: 5.0000 mg | Freq: Three times a day (TID) | INTRAMUSCULAR | Status: DC | PRN

## 2017-10-02 MED ORDER — SODIUM CHLORIDE 0.9 % IV SOLN
INTRAVENOUS | Status: AC
  Filled 2017-10-02: qty 500000

## 2017-10-02 MED ORDER — ACETAMINOPHEN 500 MG PO TABS
1000.0000 mg | ORAL_TABLET | Freq: Four times a day (QID) | ORAL | Status: DC | PRN
  Administered 2017-10-02: 1000 mg via ORAL
  Filled 2017-10-02: qty 2

## 2017-10-02 MED ORDER — CYANOCOBALAMIN 1000 MCG/ML IJ SOLN: 1000.0000 ug | INTRAMUSCULAR | Status: DC

## 2017-10-02 MED ORDER — SUGAMMADEX SODIUM 200 MG/2ML IV SOLN
INTRAVENOUS | Status: DC | PRN
  Administered 2017-10-02: 150 mg via INTRAVENOUS

## 2017-10-02 MED ORDER — ALUM & MAG HYDROXIDE-SIMETH 200-200-20 MG/5ML PO SUSP: 30.0000 mL | ORAL | Status: DC | PRN

## 2017-10-02 MED ORDER — FENTANYL CITRATE (PF) 100 MCG/2ML IJ SOLN
INTRAMUSCULAR | Status: AC
  Filled 2017-10-02: qty 2

## 2017-10-02 MED ORDER — PHENYLEPHRINE 40 MCG/ML (10ML) SYRINGE FOR IV PUSH (FOR BLOOD PRESSURE SUPPORT)
PREFILLED_SYRINGE | INTRAVENOUS | Status: AC
  Filled 2017-10-02: qty 10

## 2017-10-02 MED ORDER — LIDOCAINE 2% (20 MG/ML) 5 ML SYRINGE
INTRAMUSCULAR | Status: DC | PRN
  Administered 2017-10-02: 60 mg via INTRAVENOUS

## 2017-10-02 MED ORDER — ROCURONIUM BROMIDE 10 MG/ML (PF) SYRINGE
PREFILLED_SYRINGE | INTRAVENOUS | Status: DC | PRN
  Administered 2017-10-02: 50 mg via INTRAVENOUS

## 2017-10-02 MED ORDER — TRIAMCINOLONE ACETONIDE 0.1 % EX CREA: 1.0000 "application " | TOPICAL_CREAM | Freq: Two times a day (BID) | CUTANEOUS | Status: DC | PRN

## 2017-10-02 MED ORDER — CHLORHEXIDINE GLUCONATE 4 % EX LIQD: 60.0000 mL | Freq: Once | CUTANEOUS | Status: DC

## 2017-10-02 MED ORDER — METOCLOPRAMIDE HCL 5 MG PO TABS: 5.0000 mg | ORAL_TABLET | Freq: Three times a day (TID) | ORAL | Status: DC | PRN

## 2017-10-02 MED ORDER — FENTANYL CITRATE (PF) 100 MCG/2ML IJ SOLN
50.0000 ug | INTRAMUSCULAR | Status: DC | PRN
  Administered 2017-10-02: 25 ug via INTRAVENOUS
  Administered 2017-10-02: 100 ug via INTRAVENOUS

## 2017-10-02 MED ORDER — VITAMIN D 1000 UNITS PO TABS
5000.0000 [IU] | ORAL_TABLET | Freq: Every day | ORAL | Status: DC
  Administered 2017-10-03: 5000 [IU] via ORAL
  Filled 2017-10-02: qty 5

## 2017-10-02 MED ORDER — EPHEDRINE SULFATE-NACL 50-0.9 MG/10ML-% IV SOSY
PREFILLED_SYRINGE | INTRAVENOUS | Status: DC | PRN
  Administered 2017-10-02: 7.5 mg via INTRAVENOUS

## 2017-10-02 MED ORDER — KCL IN DEXTROSE-NACL 20-5-0.45 MEQ/L-%-% IV SOLN
INTRAVENOUS | Status: DC
  Administered 2017-10-02: 17:00:00 via INTRAVENOUS
  Filled 2017-10-02 (×2): qty 1000

## 2017-10-02 MED ORDER — BUPIVACAINE-EPINEPHRINE 0.5% -1:200000 IJ SOLN
INTRAMUSCULAR | Status: DC | PRN
  Administered 2017-10-02: 10 mL

## 2017-10-02 MED ORDER — SUGAMMADEX SODIUM 200 MG/2ML IV SOLN
INTRAVENOUS | Status: AC
  Filled 2017-10-02: qty 2

## 2017-10-02 MED ORDER — METOPROLOL SUCCINATE ER 50 MG PO TB24
50.0000 mg | ORAL_TABLET | Freq: Two times a day (BID) | ORAL | Status: DC
  Filled 2017-10-02 (×2): qty 1

## 2017-10-02 MED ORDER — MIDAZOLAM HCL 2 MG/2ML IJ SOLN: 1.0000 mg | INTRAMUSCULAR | Status: DC | PRN

## 2017-10-02 MED ORDER — BUPIVACAINE-EPINEPHRINE (PF) 0.5% -1:200000 IJ SOLN
INTRAMUSCULAR | Status: DC | PRN
  Administered 2017-10-02: 25 mL via PERINEURAL

## 2017-10-02 MED ORDER — ONDANSETRON HCL 4 MG/2ML IJ SOLN
INTRAMUSCULAR | Status: AC
  Filled 2017-10-02: qty 2

## 2017-10-02 MED ORDER — MAGNESIUM CITRATE PO SOLN: 1.0000 | Freq: Once | ORAL | Status: DC | PRN

## 2017-10-02 MED ORDER — BISACODYL 5 MG PO TBEC: 5.0000 mg | DELAYED_RELEASE_TABLET | Freq: Every day | ORAL | Status: DC | PRN

## 2017-10-02 MED ORDER — VITAMIN C 500 MG PO TABS
1000.0000 mg | ORAL_TABLET | Freq: Every day | ORAL | Status: DC
  Administered 2017-10-03: 1000 mg via ORAL
  Filled 2017-10-02: qty 2

## 2017-10-02 SURGICAL SUPPLY — 51 items
ANCH SUT SWLK 19.1X4.75 VT (Anchor) ×3 IMPLANT
ANCHOR NEEDLE 9/16 CIR SZ 8 (NEEDLE) IMPLANT
ANCHOR PEEK 4.75X19.1 SWLK C (Anchor) ×6 IMPLANT
BLADE CUTTER GATOR 3.5 (BLADE) IMPLANT
BLADE SURG SZ11 CARB STEEL (BLADE) IMPLANT
BUR OVAL 4.0 (BURR) IMPLANT
CANNULA ACUFO 5X76 (CANNULA) IMPLANT
CHLORAPREP W/TINT 26ML (MISCELLANEOUS) IMPLANT
CLEANER TIP ELECTROSURG 2X2 (MISCELLANEOUS) IMPLANT
CLOTH 2% CHLOROHEXIDINE 3PK (PERSONAL CARE ITEMS) ×2 IMPLANT
COVER SURGICAL LIGHT HANDLE (MISCELLANEOUS) ×2 IMPLANT
DECANTER SPIKE VIAL GLASS SM (MISCELLANEOUS) ×2 IMPLANT
DRAPE ORTHO SPLIT 77X108 STRL (DRAPES)
DRAPE POUCH INSTRU U-SHP 10X18 (DRAPES) IMPLANT
DRAPE STERI 35X30 U-POUCH (DRAPES) ×2 IMPLANT
DRAPE SURG ORHT 6 SPLT 77X108 (DRAPES) IMPLANT
DRSG AQUACEL AG ADV 3.5X 4 (GAUZE/BANDAGES/DRESSINGS) ×2 IMPLANT
DRSG EMULSION OIL 3X3 NADH (GAUZE/BANDAGES/DRESSINGS) IMPLANT
DRSG PAD ABDOMINAL 8X10 ST (GAUZE/BANDAGES/DRESSINGS) IMPLANT
DURAPREP 26ML APPLICATOR (WOUND CARE) ×2 IMPLANT
ELECT NEEDLE TIP 2.8 STRL (NEEDLE) IMPLANT
GLOVE BIOGEL PI IND STRL 7.5 (GLOVE) ×1 IMPLANT
GLOVE BIOGEL PI INDICATOR 7.5 (GLOVE) ×1
GLOVE SURG SS PI 7.5 STRL IVOR (GLOVE) ×4 IMPLANT
GLOVE SURG SS PI 8.0 STRL IVOR (GLOVE) ×4 IMPLANT
GOWN STRL REUS W/TWL XL LVL3 (GOWN DISPOSABLE) ×4 IMPLANT
KIT BASIN OR (CUSTOM PROCEDURE TRAY) ×2 IMPLANT
KIT POSITION SHOULDER SCHLEI (MISCELLANEOUS) ×2 IMPLANT
MANIFOLD NEPTUNE II (INSTRUMENTS) ×2 IMPLANT
NEEDLE SCORPION MULTI FIRE (NEEDLE) ×2 IMPLANT
NEEDLE SPNL 18GX3.5 QUINCKE PK (NEEDLE) IMPLANT
PACK SHOULDER (CUSTOM PROCEDURE TRAY) ×2 IMPLANT
POSITIONER SURGICAL ARM (MISCELLANEOUS) IMPLANT
SLING ARM IMMOBILIZER LRG (SOFTGOODS) IMPLANT
SLING ARM IMMOBILIZER MED (SOFTGOODS) IMPLANT
SLING ULTRA II S (ORTHOPEDIC SUPPLIES) ×2 IMPLANT
SPONGE LAP 4X18 RFD (DISPOSABLE) IMPLANT
STRIP CLOSURE SKIN 1/2X4 (GAUZE/BANDAGES/DRESSINGS) IMPLANT
SUT BONE WAX W31G (SUTURE) IMPLANT
SUT ETHILON 4 0 PS 2 18 (SUTURE) ×2 IMPLANT
SUT PROLENE 3 0 PS 2 (SUTURE) ×2 IMPLANT
SUT TIGER TAPE 7 IN WHITE (SUTURE) ×2 IMPLANT
SUT VIC AB 1-0 CT2 27 (SUTURE) IMPLANT
SUT VIC AB 2-0 CT2 27 (SUTURE) IMPLANT
SUT VICRYL 0 UR6 27IN ABS (SUTURE) ×2 IMPLANT
SUT VICRYL RAPIDE 3 0 (SUTURE) ×2 IMPLANT
TAPE FIBER 2MM 7IN #2 BLUE (SUTURE) ×2 IMPLANT
TOWEL OR 17X26 10 PK STRL BLUE (TOWEL DISPOSABLE) ×2 IMPLANT
TUBING ARTHRO INFLOW-ONLY STRL (TUBING) ×2 IMPLANT
TUBING CONNECTING 10 (TUBING) ×2 IMPLANT
WAND HAND CNTRL MULTIVAC 90 (MISCELLANEOUS) IMPLANT

## 2017-10-02 NOTE — Anesthesia Procedure Notes (Signed)
Anesthesia Regional Block: Interscalene brachial plexus block   Pre-Anesthetic Checklist: ,, timeout performed, Correct Patient, Correct Site, Correct Laterality, Correct Procedure, Correct Position, site marked, Risks and benefits discussed,  Surgical consent,  Pre-op evaluation,  At surgeon's request and post-op pain management  Laterality: Right  Prep: chloraprep       Needles:  Injection technique: Single-shot  Needle Type: Echogenic Stimulator Needle     Needle Length: 5cm  Needle Gauge: 22     Additional Needles:   Narrative:  Start time: 10/02/2017 12:14 PM End time: 10/02/2017 12:24 PM Injection made incrementally with aspirations every 5 mL.  Performed by: Personally  Anesthesiologist: Duane Boston, MD  Additional Notes: Functioning IV was confirmed and monitors applied.  A 9mm 22ga echogenic arrow stimulator was used. Sterile prep and drape,hand hygiene and sterile gloves were used.Ultrasound guidance: relevant anatomy identified, needle position confirmed, local anesthetic spread visualized around nerve(s)., vascular puncture avoided.  Image printed for medical record.  Negative aspiration and negative test dose prior to incremental administration of local anesthetic. The patient tolerated the procedure well.

## 2017-10-02 NOTE — Interval H&P Note (Signed)
History and Physical Interval Note:  10/02/2017 12:22 PM  Anita Bailey  has presented today for surgery, with the diagnosis of Right shoulder rotator cuff tear  The various methods of treatment have been discussed with the patient and family. After consideration of risks, benefits and other options for treatment, the patient has consented to  Procedure(s) with comments: Right shoulder mini open rotator cuff repair possible patch graft (Right) - 90 mins as a surgical intervention .  The patient's history has been reviewed, patient examined, no change in status, stable for surgery.  I have reviewed the patient's chart and labs.  Questions were answered to the patient's satisfaction.     Anita Bailey C

## 2017-10-02 NOTE — Op Note (Signed)
NAME: Anita Bailey, Anita Bailey MEDICAL RECORD [id number] ACCOUNT [id number] DATE OF BIRTH:[birth year] FACILITY: [facility] LOCATION: [facility] PHYSICIAN:Merril Nagy Windy Kalata, MD  OPERATIVE REPORT  DATE OF PROCEDURE:  10/02/2017  PREOPERATIVE DIAGNOSIS:  Full-thickness retracted rotator cuff tear, right.  POSTOPERATIVE DIAGNOSIS:  Full thickness retracted rotator cuff tear, right.  PROCEDURE PERFORMED:  Mini open rotator cuff repair.  ANESTHESIA:  General.  SURGEON:  Susa Day, MD  ASSISTANT:  Lacie Draft, PA.  HISTORY:  A1 retracted tear of the rotator cuff, severe pain.  Indicated for repair of the rotator cuff with an MRI indicating tear.  Risks and benefits discussed including bleeding, infection, damage to neurovascular structures, no change in symptoms,  worsening symptoms, DVT, PE, anesthetic complications, etc.  The patient has multiple allergies preoperatively had some allergic dermatitis and multiple skin folds not near the operative site.  TECHNIQUE:  The patient supine beach chair position.  After induction of adequate general anesthesia and 1 gram vancomycin for prophylaxis, the right shoulder and upper extremity were prepped and draped in the usual sterile fashion.  She had normal range  of motion.  A surgical marker was utilized to delineate the acromion, AC joint and coracoid.  A small 2 cm incision was made over the anterolateral aspect of the acromion.  Subcutaneous tissue was dissected.  Electrocautery was utilized to achieve hemostasis.  The  rhaphe between the anterolateral head was divided in line with the skin incision.  A self-retaining retractor was placed.  Preserved the deltoid attachment.  Spur off the anterolateral aspect of the acromion was removed with a 3 mm Kerrison.  I released  the CA ligament.  Hypertrophic synovitis was noted and performed a full bursectomy.  The tear was noted both in the infraspinatus and supraspinatus with the center portion noted.  Cuff  was mobilized, copiously irrigated.  I fashioned a trough lateral to  the articular surface with a bear rongeur.  I thought it would be amenable to repair with #3 SwiveLocks.  One was placed in that cancellous bed after piloting the hole with an awl.  Inserted a SwiveLock with FiberTape associated with it.  Inserted and  excellent resistance to pullout was noted.  4 four leaflets were then passed into the anterior and posterior portions of the tear with a Scorpion suture passer.  These were then crossed, advanced into the trough and secured over the lateral aspect of the  greater tuberosity with 2 separate SwiveLocks, 1.5 cm apart.  After piloting the hole with an awl placing them without undue tension, redundant suture removed.  Full coverage was noted.  Inspection of the remainder of the cuff was unremarkable.  This  was secured with the arm down at the side.  She had good external and internal rotation.  Copious irrigation.  Raphe was closed with 0 Vicryl, subcutaneous with 2-0 and skin with Monocryl.  Sterile dressing applied, placed in an abduction pillow.    Extubated without difficulty and transported to the recovery room in satisfactory condition.  The patient tolerated the procedure well.  No complications.  Assistant Lacie Draft PA.  Minimal blood loss.  AN/NUANCE  D:10/02/2017 T:10/02/2017 JOB:[id number]

## 2017-10-02 NOTE — Brief Op Note (Signed)
10/02/2017  2:12 PM  PATIENT:  Anita Bailey  83 y.o. female  PRE-OPERATIVE DIAGNOSIS:  Right shoulder rotator cuff tear  POST-OPERATIVE DIAGNOSIS:  Right shoulder rotator cuff tear  PROCEDURE:  Procedure(s) with comments: Right shoulder mini open rotator cuff repair possible patch graft (Right) - 90 mins  SURGEON:  Surgeon(s) and Role:    Susa Day, MD - Primary  PHYSICIAN ASSISTANT:   ASSISTANTS: Bissell   ANESTHESIA:   general  EBL:  10 mL   BLOOD ADMINISTERED:none  DRAINS: none   LOCAL MEDICATIONS USED:  MARCAINE     SPECIMEN:  No Specimen  DISPOSITION OF SPECIMEN:  N/A  COUNTS:  YES  TOURNIQUET:  * No tourniquets in log *  DICTATION: .Other Dictation: Dictation Number [id number]  PLAN OF CARE: Admit for overnight observation  PATIENT DISPOSITION:  PACU - hemodynamically stable.   Delay start of Pharmacological VTE agent (>24hrs) due to surgical blood loss or risk of bleeding: no

## 2017-10-02 NOTE — Anesthesia Preprocedure Evaluation (Signed)
Anesthesia Evaluation  Patient identified by MRN, date of birth, ID band Patient awake    Reviewed: Allergy & Precautions, NPO status , Patient's Chart, lab work & pertinent test results  History of Anesthesia Complications Negative for: history of anesthetic complications  Airway Mallampati: II  TM Distance: >3 FB Neck ROM: Full    Dental no notable dental hx. (+) Dental Advisory Given   Pulmonary neg pulmonary ROS, neg sleep apnea, neg COPD,    breath sounds clear to auscultation- rhonchi (-) wheezing      Cardiovascular hypertension, Pt. on medications (-) CAD, (-) Past MI, (-) Cardiac Stents and (-) CABG  Rhythm:Regular Rate:Normal - Systolic murmurs and - Diastolic murmurs    Neuro/Psych PSYCHIATRIC DISORDERS Depression negative neurological ROS     GI/Hepatic negative GI ROS, Neg liver ROS,   Endo/Other  negative endocrine ROSneg diabetes  Renal/GU Renal disease: hx of nephrolithiasis.negative Renal ROS     Musculoskeletal  (+) Arthritis ,   Abdominal (+) - obese,   Peds  Hematology  (+) anemia ,   Anesthesia Other Findings    Reproductive/Obstetrics                             Anesthesia Physical  Anesthesia Plan  ASA: II  Anesthesia Plan: General   Post-op Pain Management:  Regional for Post-op pain   Induction: Intravenous  PONV Risk Score and Plan: 2 and 3 and Ondansetron, Dexamethasone and Diphenhydramine  Airway Management Planned: Oral ETT  Additional Equipment:   Intra-op Plan:   Post-operative Plan: Extubation in OR  Informed Consent: I have reviewed the patients History and Physical, chart, labs and discussed the procedure including the risks, benefits and alternatives for the proposed anesthesia with the patient or authorized representative who has indicated his/her understanding and acceptance.   Dental advisory given  Plan Discussed with: CRNA and  Anesthesiologist  Anesthesia Plan Comments:         Anesthesia Quick Evaluation

## 2017-10-02 NOTE — Transfer of Care (Signed)
Immediate Anesthesia Transfer of Care Note  Patient: Anita Bailey  Procedure(s) Performed: Right shoulder mini open rotator cuff repair (Right )  Patient Location: PACU  Anesthesia Type:General  Level of Consciousness: awake, alert  and oriented  Airway & Oxygen Therapy: Patient Spontanous Breathing and Patient connected to face mask oxygen  Post-op Assessment: Report given to RN and Post -op Vital signs reviewed and stable  Post vital signs: Reviewed and stable  Last Vitals:  Vitals Value Taken Time  BP 148/70 10/02/2017  2:27 PM  Temp    Pulse 71 10/02/2017  2:30 PM  Resp 19 10/02/2017  2:30 PM  SpO2 100 % 10/02/2017  2:30 PM  Vitals shown include unvalidated device data.  Last Pain:  Vitals:   10/02/17 1218  TempSrc:   PainSc: 0-No pain         Complications: No apparent anesthesia complications

## 2017-10-02 NOTE — Anesthesia Procedure Notes (Signed)
Procedure Name: Intubation Date/Time: 10/02/2017 1:08 PM Performed by: Claudia Desanctis, CRNA Pre-anesthesia Checklist: Patient identified, Emergency Drugs available, Suction available and Patient being monitored Patient Re-evaluated:Patient Re-evaluated prior to induction Oxygen Delivery Method: Circle system utilized Preoxygenation: Pre-oxygenation with 100% oxygen Induction Type: IV induction Ventilation: Mask ventilation without difficulty Laryngoscope Size: 2 and Miller Grade View: Grade I Tube type: Oral Tube size: 7.0 mm Number of attempts: 1 Airway Equipment and Method: Stylet Placement Confirmation: ETT inserted through vocal cords under direct vision,  positive ETCO2 and breath sounds checked- equal and bilateral Secured at: 21 cm Tube secured with: Tape Dental Injury: Teeth and Oropharynx as per pre-operative assessment

## 2017-10-02 NOTE — Anesthesia Postprocedure Evaluation (Signed)
Anesthesia Post Note  Patient: Anita Bailey  Procedure(s) Performed: Right shoulder mini open rotator cuff repair (Right )     Patient location during evaluation: PACU Anesthesia Type: General Level of consciousness: sedated Pain management: pain level controlled Vital Signs Assessment: post-procedure vital signs reviewed and stable Respiratory status: spontaneous breathing and respiratory function stable Cardiovascular status: stable Postop Assessment: no apparent nausea or vomiting Anesthetic complications: no    Last Vitals:  Vitals:   10/02/17 1500 10/02/17 1515  BP: 130/83 (!) 154/87  Pulse: (!) 58 (!) 58  Resp: 15 13  Temp: (!) 36.3 C (!) 36.3 C  SpO2: 98% 100%    Last Pain:  Vitals:   10/02/17 1515  TempSrc:   PainSc: Asleep                 Malone Vanblarcom DANIEL

## 2017-10-02 NOTE — Progress Notes (Signed)
Assisted Dr. Singer with right, ultrasound guided, interscalene  block. Side rails up, monitors on throughout procedure. See vital signs in flow sheet. Tolerated Procedure well. 

## 2017-10-02 NOTE — Discharge Instructions (Signed)

## 2017-10-03 DIAGNOSIS — M75121 Complete rotator cuff tear or rupture of right shoulder, not specified as traumatic: Secondary | ICD-10-CM | POA: Diagnosis not present

## 2017-10-03 LAB — BASIC METABOLIC PANEL
ANION GAP: 7 (ref 5–15)
BUN: 18 mg/dL (ref 8–23)
CALCIUM: 8.6 mg/dL — AB (ref 8.9–10.3)
CO2: 26 mmol/L (ref 22–32)
CREATININE: 0.85 mg/dL (ref 0.44–1.00)
Chloride: 107 mmol/L (ref 98–111)
Glucose, Bld: 113 mg/dL — ABNORMAL HIGH (ref 70–99)
Potassium: 4.8 mmol/L (ref 3.5–5.1)
SODIUM: 140 mmol/L (ref 135–145)

## 2017-10-03 LAB — CBC
HEMATOCRIT: 39.4 % (ref 36.0–46.0)
Hemoglobin: 12.3 g/dL (ref 12.0–15.0)
MCH: 31.1 pg (ref 26.0–34.0)
MCHC: 31.2 g/dL (ref 30.0–36.0)
MCV: 99.5 fL (ref 78.0–100.0)
PLATELETS: 282 10*3/uL (ref 150–400)
RBC: 3.96 MIL/uL (ref 3.87–5.11)
RDW: 13.7 % (ref 11.5–15.5)
WBC: 10.6 10*3/uL — ABNORMAL HIGH (ref 4.0–10.5)

## 2017-10-03 NOTE — Discharge Summary (Signed)
Orthopedic Discharge Summary        Physician Discharge Summary  Patient ID: Anita Bailey MRN: [id number] DOB/AGE: 81-Jun-1936 81 y.o.  Admit date: 10/02/2017 Discharge date: 10/03/2017   Procedures:  Procedure(s) (LRB): Right shoulder mini open rotator cuff repair (Right)  Attending Physician:  Dr. Tonita Cong  Admission Diagnoses:   Right shoulder cuff tear  Discharge Diagnoses:  Right shoulder cuff tear   Past Medical History:  Diagnosis Date  . Anemia   . Arthritis   . Cancer ([place])    hx of melanoma in 1971, basal cell   . Cervical disc disease   . Chronic kidney disease    current uti 08/05/11   . Depression   . DVT (deep venous thrombosis) ([place]) 01/2015   was Eliquis until 06/13/15-   . Dysrhythmia    hx of heart racing - in past, "none  in years".  had a stress test > 7 years  . History of recurrent UTIs    "bladder infection"  Had a PICC line in past  . Hyperlipidemia   . Hypertension   . Melanoma ([place])   . Nephrolithiasis   . Pulmonary emboli ([facility])    bilateral 8/12 hospitalized   . Vitamin D deficiency     PCP: Rusty Aus, MD   Discharged Condition: good  Hospital Course:  Patient underwent the above stated procedure on 10/02/2017. Patient tolerated the procedure well and brought to the recovery room in good condition and subsequently to the floor. Patient had an uncomplicated hospital course and was stable for discharge.   Disposition: Discharge disposition: 01-Home or Self Care      with follow up in 2 weeks   Follow-up Information    Susa Day, MD Follow up in 2 week(s).   Specialty:  Orthopedic Surgery Contact information: [address] [place] [address] [phone]           Discharge Instructions    Call MD / Call 911   Complete by:  As directed    If you experience chest pain or shortness of breath, CALL 911 and be transported to the hospital emergency room.  If you develope a fever above 101 F, pus  (white drainage) or increased drainage or redness at the wound, or calf pain, call your surgeon's office.   Constipation Prevention   Complete by:  As directed    Drink plenty of fluids.  Prune juice may be helpful.  You may use a stool softener, such as Colace (over the counter) 100 mg twice a day.  Use MiraLax (over the counter) for constipation as needed.   Diet - low sodium heart healthy   Complete by:  As directed    Increase activity slowly as tolerated   Complete by:  As directed       Allergies as of 10/03/2017      Reactions   Penicillins Anaphylaxis, Other (See Comments)   Has patient had a PCN reaction causing immediate rash, facial/tongue/throat swelling, SOB or lightheadedness with hypotension: yes Has patient had a PCN reaction causing severe rash involving mucus membranes or skin necrosis no Has patient had a PCN reaction that required hospitalization no Has patient had a PCN reaction occurring within the last 10 years: no If all of the above answers are "NO", then may proceed with Cephalosporin use.   Sulfa Antibiotics Other (See Comments), Itching, Rash   "pass out" Other reaction(s): Other (See Comments), Syncope Is able to take some  sulfa abx per pt. But allergic to most syncope   Latex Rash, Other (See Comments)   Blisters   Monosodium Glutamate Other (See Comments)   Migraine headache   Glutamic Acid    Caffeine Swelling   Celebrex [celecoxib] Rash   Clindamycin/lincomycin Rash   Diclofenac Rash   Elastic Bandages & [zinc] Rash   Allergic to Elastic in underwear   Macrobid [nitrofurantoin Monohyd Macro] Rash   Tape Itching, Swelling, Other (See Comments)   Redness, burning      Medication List    STOP taking these medications   acetaminophen 500 MG tablet Commonly known as:  TYLENOL     TAKE these medications   cyanocobalamin 1000 MCG/ML injection Commonly known as:  (VITAMIN B-12) Inject 1,000 mcg into the muscle every 30 (thirty) days.     diclofenac sodium 1 % Gel Commonly known as:  VOLTAREN Apply 2 g topically 2 (two) times daily as needed (For pain.).   docusate sodium 100 MG capsule Commonly known as:  COLACE Take 1 capsule (100 mg total) by mouth 2 (two) times daily.   HYDROcodone-acetaminophen 5-325 MG tablet Commonly known as:  NORCO/VICODIN Take 1-2 tablets by mouth every 6 (six) hours as needed (for pain). What changed:  how much to take   LYRICA 50 MG capsule Generic drug:  pregabalin Take 50 mg by mouth 2 (two) times daily.   metoprolol succinate 50 MG 24 hr tablet Commonly known as:  TOPROL-XL Take 50 mg by mouth 2 (two) times daily. Take with or immediately following a meal.   triamcinolone cream 0.1 % Commonly known as:  KENALOG Apply 1 application topically 2 (two) times daily as needed (for irritated/itchy skin).   venlafaxine XR 75 MG 24 hr capsule Commonly known as:  EFFEXOR-XR Take 75 mg by mouth daily.   vitamin C 1000 MG tablet Take 1,000 mg by mouth daily.   Vitamin D3 5000 units Tabs Take 5,000 Units by mouth daily.   zolpidem 5 MG tablet Commonly known as:  AMBIEN Take 2.5-5 mg by mouth at bedtime.         Signed: Ventura Bruns 10/03/2017, 9:18 AM  [facility] is now [employer] [address] [place], [place], [place] [address] Phone: [place]

## 2017-10-03 NOTE — Progress Notes (Signed)
Occupational Therapy Evaluation Patient Details Name: HILARY MILKS MRN: [id number] DOB: [birth year] Today's Date: 10/03/2017    History of Present Illness 83 yo F s/p right rotator cuff repair   Clinical Impression   All OT education completed and pt questions answered. Patient safe to discharge home with assistance from daughter-in-law 24/7.     Follow Up Recommendations  No OT follow up;Supervision/Assistance - 24 hour    Equipment Recommendations  None recommended by OT    Recommendations for Other Services       Precautions / Restrictions Precautions Precautions: Shoulder Type of Shoulder Precautions: NWB RUE, no AROM R shoulder Shoulder Interventions: Don joy ultra sling;Off for dressing/bathing/exercises Precaution Booklet Issued: Yes (comment) Required Braces or Orthoses: Sling Restrictions Weight Bearing Restrictions: Yes RUE Weight Bearing: Non weight bearing      Mobility Bed Mobility Overal bed mobility: Needs Assistance Bed Mobility: Supine to Sit     Supine to sit: Supervision;HOB elevated        Transfers Overall transfer level: Needs assistance Equipment used: 1 person hand held assist Transfers: Sit to/from Stand Sit to Stand: Min assist         General transfer comment: from low surfaces    Balance Overall balance assessment: Mild deficits observed, not formally tested                                         ADL either performed or assessed with clinical judgement   ADL Overall ADL's : Needs assistance/impaired Eating/Feeding: Set up;Sitting   Grooming: Minimal assistance;Sitting   Upper Body Bathing: Moderate assistance;Sitting   Lower Body Bathing: Minimal assistance;Sit to/from stand   Upper Body Dressing : Moderate assistance;Sitting   Lower Body Dressing: Minimal assistance;Sit to/from stand   Toilet Transfer: Min guard;Ambulation;BSC   Toileting- Water quality scientist and Hygiene: Min guard;Sit  to/from stand   Tub/ Banker: Walk-in shower;Minimal assistance   Functional mobility during ADLs: Pharmacist, community      Pertinent Vitals/Pain Pain Assessment: 0-10 Pain Score: 4  Pain Location: R shoulder Pain Descriptors / Indicators: Aching;Sore Pain Intervention(s): Monitored during session     Hand Dominance Right   Extremity/Trunk Assessment Upper Extremity Assessment Upper Extremity Assessment: RUE deficits/detail RUE Deficits / Details: RUE no AROM; otherwise WFL elbow to hand RUE: Unable to fully assess due to immobilization   Lower Extremity Assessment Lower Extremity Assessment: Overall WFL for tasks assessed       Communication Communication Communication: No difficulties   Cognition Arousal/Alertness: Awake/alert Behavior During Therapy: WFL for tasks assessed/performed Overall Cognitive Status: Within Functional Limits for tasks assessed                                     General Comments  Provided shoulder protocol handout and pendulum exercise handout. Reviewed all. Practiced pendulum exercises, as well as AROM exercises of elbow, wrist, hand, neck    Exercises     Shoulder Instructions      Home Living Family/patient expects to be discharged to:: Private residence Living Arrangements: Spouse/significant other;Children Available Help at Discharge: Family;Available 24 hours/day Type of Home: House       Home Layout: One level  Bathroom Shower/Tub: Occupational psychologist: Handicapped height     Home Equipment: [place] - single point;Shower seat - built in          Prior Functioning/Environment Level of Independence: Independent with assistive device(s)                 OT Problem List: Decreased strength;Decreased range of motion;Impaired balance (sitting and/or standing);Decreased knowledge of use of DME or AE;Decreased knowledge of  precautions;Impaired UE functional use      OT Treatment/Interventions:      OT Goals(Current goals can be found in the care plan section) Acute Rehab OT Goals Patient Stated Goal: home today - going to daughter-in-law's home OT Goal Formulation: All assessment and education complete, DC therapy  OT Frequency:     Barriers to D/C:            Co-evaluation              AM-PAC PT "6 Clicks" Daily Activity     Outcome Measure Help from another person eating meals?: None Help from another person taking care of personal grooming?: A Little Help from another person toileting, which includes using toliet, bedpan, or urinal?: A Little Help from another person bathing (including washing, rinsing, drying)?: A Lot Help from another person to put on and taking off regular upper body clothing?: A Lot Help from another person to put on and taking off regular lower body clothing?: A Little 6 Click Score: 17   End of Session Nurse Communication: Mobility status  Activity Tolerance: Patient tolerated treatment well Patient left: in bed;with call bell/phone within reach;with family/visitor present  OT Visit Diagnosis: Unsteadiness on feet (R26.81);Muscle weakness (generalized) (M62.81)                Time: 3007-6226 OT Time Calculation (min): 44 min Charges:  OT General Charges $OT Visit: 1 Visit OT Evaluation $OT Eval Low Complexity: 1 Low OT Treatments $Self Care/Home Management : 8-22 mins $Therapeutic Exercise: 8-22 mins    Donna Silverman A Brodi Kari 10/03/2017, 1:36 PM

## 2017-10-03 NOTE — Progress Notes (Signed)
   Subjective: 1 Day Post-Op Procedure(s) (LRB): Right shoulder mini open rotator cuff repair (Right)  Pt doing well this morning Mild pain/soreness in the shoulder  Denies any new symptoms Ready for d/c home Patient reports pain as mild.  Objective:   VITALS:   Vitals:   10/03/17 0513 10/03/17 0912  BP: (!) 103/54 (!) 104/51  Pulse: (!) 58 64  Resp: 16 16  Temp: (!) 97.5 F (36.4 C)   SpO2: 95% 96%    Right shoulder dressing and sling in place nv intact distally No rashes or edema Slight numbness in right thumb but able to extend wrist, radial nerve intact  LABS Recent Labs    10/03/17 0400  HGB 12.3  HCT 39.4  WBC 10.6*  PLT 282    Recent Labs    10/03/17 0400  NA 140  K 4.8  BUN 18  CREATININE 0.85  GLUCOSE 113*     Assessment/Plan: 1 Day Post-Op Procedure(s) (LRB): Right shoulder mini open rotator cuff repair (Right) D/c home today F/u in 2 weeks in the office Pain management Pulmonary toilet    Brad Luna [place], [place] is now [facility]  [place] [address] [place], [place], [place] [address] Phone: [phone] [email or web address] Facebook  [employer]

## 2017-10-05 ENCOUNTER — Encounter (HOSPITAL_COMMUNITY): Payer: Self-pay | Admitting: Specialist

## 2017-12-09 ENCOUNTER — Encounter: Payer: Self-pay | Admitting: Emergency Medicine

## 2017-12-09 ENCOUNTER — Emergency Department
Admission: EM | Admit: 2017-12-09 | Discharge: 2017-12-09 | Disposition: A | Discharge status: Home / Self Care | Payer: Medicare Other | Attending: Emergency Medicine | Admitting: Emergency Medicine

## 2017-12-09 ENCOUNTER — Emergency Department: Payer: Medicare Other

## 2017-12-09 DIAGNOSIS — Z79899 Other long term (current) drug therapy: Secondary | ICD-10-CM | POA: Diagnosis not present

## 2017-12-09 DIAGNOSIS — Z96653 Presence of artificial knee joint, bilateral: Secondary | ICD-10-CM | POA: Insufficient documentation

## 2017-12-09 DIAGNOSIS — R002 Palpitations: Secondary | ICD-10-CM | POA: Insufficient documentation

## 2017-12-09 DIAGNOSIS — Z91040 Latex allergy status: Secondary | ICD-10-CM | POA: Insufficient documentation

## 2017-12-09 DIAGNOSIS — Z85820 Personal history of malignant melanoma of skin: Secondary | ICD-10-CM | POA: Insufficient documentation

## 2017-12-09 DIAGNOSIS — I10 Essential (primary) hypertension: Secondary | ICD-10-CM | POA: Insufficient documentation

## 2017-12-09 DIAGNOSIS — R531 Weakness: Secondary | ICD-10-CM | POA: Diagnosis present

## 2017-12-09 DIAGNOSIS — N390 Urinary tract infection, site not specified: Secondary | ICD-10-CM | POA: Diagnosis not present

## 2017-12-09 LAB — BASIC METABOLIC PANEL
Anion gap: 10 (ref 5–15)
BUN: 11 mg/dL (ref 8–23)
CO2: 26 mmol/L (ref 22–32)
CREATININE: 0.73 mg/dL (ref 0.44–1.00)
Calcium: 9 mg/dL (ref 8.9–10.3)
Chloride: 106 mmol/L (ref 98–111)
Glucose, Bld: 109 mg/dL — ABNORMAL HIGH (ref 70–99)
POTASSIUM: 4.2 mmol/L (ref 3.5–5.1)
SODIUM: 142 mmol/L (ref 135–145)

## 2017-12-09 LAB — CBC
HCT: 42.9 % (ref 36.0–46.0)
HEMOGLOBIN: 13.4 g/dL (ref 12.0–15.0)
MCH: 30.5 pg (ref 26.0–34.0)
MCHC: 31.2 g/dL (ref 30.0–36.0)
MCV: 97.7 fL (ref 80.0–100.0)
NRBC: 0 % (ref 0.0–0.2)
Platelets: 270 10*3/uL (ref 150–400)
RBC: 4.39 MIL/uL (ref 3.87–5.11)
RDW: 12.7 % (ref 11.5–15.5)
WBC: 5.6 10*3/uL (ref 4.0–10.5)

## 2017-12-09 LAB — URINALYSIS, COMPLETE (UACMP) WITH MICROSCOPIC
Bilirubin Urine: NEGATIVE
Glucose, UA: NEGATIVE mg/dL
Hgb urine dipstick: NEGATIVE
Ketones, ur: 5 mg/dL — AB
Nitrite: NEGATIVE
PROTEIN: NEGATIVE mg/dL
Specific Gravity, Urine: 1.004 — ABNORMAL LOW (ref 1.005–1.030)
Squamous Epithelial / LPF: NONE SEEN (ref 0–5)
pH: 7 (ref 5.0–8.0)

## 2017-12-09 LAB — PROTIME-INR
INR: 0.97
PROTHROMBIN TIME: 12.8 s (ref 11.4–15.2)

## 2017-12-09 LAB — TROPONIN I: Troponin I: <0.03 ng/mL (ref <0.03)

## 2017-12-09 LAB — HEPATIC FUNCTION PANEL
ALK PHOS: 129 U/L — AB (ref 38–126)
ALT: 15 U/L (ref 0–44)
AST: 23 U/L (ref 15–41)
Albumin: 3.9 g/dL (ref 3.5–5.0)
TOTAL PROTEIN: 6.6 g/dL (ref 6.5–8.1)
Total Bilirubin: 0.8 mg/dL (ref 0.3–1.2)

## 2017-12-09 LAB — LIPASE, BLOOD: Lipase: 31 U/L (ref 11–51)

## 2017-12-09 LAB — TSH: TSH: 1.404 u[IU]/mL (ref 0.350–4.500)

## 2017-12-09 MED ORDER — FOSFOMYCIN TROMETHAMINE 3 G PO PACK
3.0000 g | PACK | Freq: Once | ORAL | Status: AC
  Administered 2017-12-09: 3 g via ORAL
  Filled 2017-12-09: qty 3

## 2017-12-09 MED ORDER — IOHEXOL 350 MG/ML SOLN
75.0000 mL | Freq: Once | INTRAVENOUS | Status: AC | PRN
  Administered 2017-12-09: 75 mL via INTRAVENOUS

## 2017-12-09 NOTE — ED Triage Notes (Signed)
Patient presents to the ED with chest pain and weakness.  Patient states yesterday she felt like her heart was beating very fast and then she has been having intermittent chest tightness.  Patient states this morning she has felt very weak and shaky and tired.  Patient has been feeling very cold.  Patient is alert and oriented x 4.

## 2017-12-09 NOTE — ED Provider Notes (Addendum)
[facility] Emergency Department Provider Note  ____________________________________________   I have reviewed the triage vital signs and the nursing notes. Where available I have reviewed prior notes and, if possible and indicated, outside hospital notes.    HISTORY  Chief Complaint Chest Pain and Weakness    HPI Anita Bailey is a 83 y.o. female who states that she used to have a "racing heart" years ago, states that her heart is been "racing over the last 2 and half days.  She took an extra dose of metoprolol this morning and now she feels that it is back to normal or even may be slightly slower than normal.  She states it was uncomfortable feels her heart going fast but she was not having any other chest pains.  No radiation of any chest pain.  This uncomfortable feeling from her heart rate.  Patient does have a history of PEs and DVTs, she denies any leg swelling, she denies any pleuritic chest pain shortness of breath or other PE associated symptoms that she has had in the past.  She denies any fever, however she states she feels a little bit chilly at this moment.  States generally speaking she has decreased energy.  She states after taking the extra metoprolol, last night and then this morning her normal dose, she feels much closer to baseline than she had for the last several days.. Denies cough, she denies dysuria urinary frequency melena bright red blood per rectum or hematemesis.  Past Medical History:  Diagnosis Date  . Anemia   . Arthritis   . Cancer ([place])    hx of melanoma in 1971, basal cell   . Cervical disc disease   . Chronic kidney disease    current uti 08/05/11   . Depression   . DVT (deep venous thrombosis) ([place]) 01/2015   was Eliquis until 06/13/15-   . Dysrhythmia    hx of heart racing - in past, "none  in years".  had a stress test > 7 years  . History of recurrent UTIs    "bladder infection"  Had a PICC line in past  . Hyperlipidemia    . Hypertension   . Melanoma ([place])   . Nephrolithiasis   . Pulmonary emboli (H[facility])    bilateral 8/12 hospitalized   . Vitamin D deficiency     Patient Active Problem List   Diagnosis Date Noted  . Rotator cuff tear, right 10/02/2017  . Complete rotator cuff tear 10/02/2017  . Essential hypertension 07/31/2016  . Renal artery stenosis ([place]) 07/31/2016  . DJD (degenerative joint disease) 07/31/2016  . Distal radius fracture, right 06/30/2015  . S/P left TKA 08/11/2011    Past Surgical History:  Procedure Laterality Date  . ABDOMINAL HYSTERECTOMY     1981  . BACK SURGERY    . BREAST EXCISIONAL BIOPSY Right 1979   Negative  . COLONOSCOPY W/ POLYPECTOMY    . COLONOSCOPY WITH PROPOFOL N/A 04/29/2017   Procedure: COLONOSCOPY WITH PROPOFOL;  Surgeon: [name], MD;  Location: [facility] ENDOSCOPY;  Service: Endoscopy;  Laterality: N/A;  . JOINT REPLACEMENT     bilateral knee  . left wrist      surgery x 2   . OPEN REDUCTION INTERNAL FIXATION (ORIF) DISTAL RADIAL FRACTURE Right 06/30/2015   Procedure: OPEN REDUCTION INTERNAL FIXATION (ORIF) RIGHT DISTAL RADIUS FRACTURE ;  Surgeon: [name], MD;  Location: [place];  Service: Orthopedics;  Laterality: Right;  . OTHER SURGICAL HISTORY  Right    leg - melonoma  . SHOULDER ARTHROSCOPY WITH OPEN ROTATOR CUFF REPAIR Right 10/02/2017   Procedure: Right shoulder mini open rotator cuff repair;  Surgeon: Susa Day, MD;  Location: [facility];  Service: Orthopedics;  Laterality: Right;  90 mins  . TONSILLECTOMY    . TOTAL KNEE ARTHROPLASTY  08/11/2011   Procedure: TOTAL KNEE ARTHROPLASTY;  Surgeon: [name], MD;  Location: [facility];  Service: Orthopedics;  Laterality: Left;  . TOTAL KNEE ARTHROPLASTY Left 2010    Prior to Admission medications   Medication Sig Start Date End Date Taking? Authorizing Provider  Ascorbic Acid (VITAMIN C) 1000 MG tablet Take 1,000 mg by mouth daily.   Yes [provider]  Cholecalciferol (VITAMIN  D3) 5000 units TABS Take 5,000 Units by mouth daily.   Yes [provider]  cyanocobalamin (,VITAMIN B-12,) 1000 MCG/ML injection Inject 1,000 mcg into the muscle every 30 (thirty) days.   Yes [provider]  diclofenac sodium (VOLTAREN) 1 % GEL Apply 2 g topically 2 (two) times daily as needed (For pain.).  05/07/15  Yes [provider]  docusate sodium (COLACE) 100 MG capsule Take 1 capsule (100 mg total) by mouth 2 (two) times daily. 10/02/17 10/02/18 Yes Cecilie Kicks, PA-C  HYDROcodone-acetaminophen (NORCO/VICODIN) 5-325 MG tablet Take 1-2 tablets by mouth every 6 (six) hours as needed (for pain). 10/02/17  Yes Bissell, Jaclyn M, PA-C  LYRICA 50 MG capsule Take 50 mg by mouth 2 (two) times daily. 08/14/17  Yes [provider]  metoprolol succinate (TOPROL-XL) 50 MG 24 hr tablet Take 50 mg by mouth 2 (two) times daily. Take with or immediately following a meal.   Yes [provider]  venlafaxine XR (EFFEXOR-XR) 75 MG 24 hr capsule Take 75 mg by mouth daily. 06/05/15  Yes [provider]  zolpidem (AMBIEN) 5 MG tablet Take 2.5-5 mg by mouth at bedtime.    Yes [provider]  triamcinolone cream (KENALOG) 0.1 % Apply 1 application topically 2 (two) times daily as needed (for irritated/itchy skin).  07/14/17   [provider]    Allergies Penicillins; Sulfa antibiotics; Latex; Monosodium glutamate; Glutamic acid; Caffeine; Celebrex [celecoxib]; Clindamycin/lincomycin; Diclofenac; Elastic bandages & [zinc]; Macrobid [nitrofurantoin monohyd macro]; and Tape  Family History  Problem Relation Age of Onset  . Breast cancer Cousin     Social History Social History   Tobacco Use  . Smoking status: Never Smoker  . Smokeless tobacco: Never Used  Substance Use Topics  . Alcohol use: Yes    Alcohol/week: 1.0 standard drinks    Types: 1 Glasses of wine per week  . Drug use: No    Review of Systems Constitutional: No  fever/chills Eyes: No visual changes. ENT: No sore throat. No stiff neck no neck pain Cardiovascular: Denies chest pain. Respiratory: Denies shortness of breath. Gastrointestinal:   no vomiting.  No diarrhea.  No constipation. Genitourinary: Negative for dysuria. Musculoskeletal: Negative lower extremity swelling Skin: Negative for rash. Neurological: Negative for severe headaches, focal weakness or numbness.   ____________________________________________   PHYSICAL EXAM:  VITAL SIGNS: ED Triage Vitals  Enc Vitals Group     BP 12/09/17 1126 (!) 145/67     Pulse Rate 12/09/17 1126 64     Resp 12/09/17 1126 20     Temp 12/09/17 1126 98.3 F (36.8 C)     Temp Source 12/09/17 1126 Oral     SpO2 12/09/17 1126 97 %  Weight 12/09/17 1122 150 lb (68 kg)     Height 12/09/17 1122 5\' 5"  (1.651 m)     Head Circumference --      Peak Flow --      Pain Score 12/09/17 1122 8     Pain Loc --      Pain Edu? --      Excl. in [place]? --     Constitutional: Alert and oriented. Well appearing and in no acute distress. Eyes: Conjunctivae are normal Head: Atraumatic HEENT: No congestion/rhinnorhea. Mucous membranes are moist.  Oropharynx non-erythematous Neck:   Nontender with no meningismus, no masses, no stridor Cardiovascular: Normal rate, regular rhythm. Grossly normal heart sounds.  Good peripheral circulation. Respiratory: Normal respiratory effort.  No retractions. Lungs CTAB. Abdominal: Soft and nontender. No distention. No guarding no rebound Back:  There is no focal tenderness or step off.  there is no midline tenderness there are no lesions noted. there is no CVA tenderness Musculoskeletal: No lower extremity tenderness, no upper extremity tenderness. No joint effusions, no DVT signs strong distal pulses no edema Neurologic:  Normal speech and language. No gross focal neurologic deficits are appreciated.  Skin:  Skin is warm, dry and intact. No rash noted. Psychiatric: Mood and  affect are Axis. Speech and behavior are normal.  ____________________________________________   LABS (all labs ordered are listed, but only abnormal results are displayed)  Labs Reviewed  BASIC METABOLIC PANEL  CBC  TROPONIN I  PROTIME-INR  TSH  URINALYSIS, COMPLETE (UACMP) WITH MICROSCOPIC    Pertinent labs  results that were available during my care of the patient were reviewed by me and considered in my medical decision making (see chart for details). ____________________________________________  EKG  I personally interpreted any EKGs ordered by me or triage This rhythm at 68 bpm no acute ST elevation or depression nonspecific ST changes which are largely unchanged from prior, LAD noted.  Compared to prior EKG from 2017, June, no changes appreciated.  At that time, her EKG was document is unchanged since 2006 ____________________________________________  RADIOLOGY  Pertinent labs & imaging results that were available during my care of the patient were reviewed by me and considered in my medical decision making (see chart for details). If possible, patient and/or family made aware of any abnormal findings.  No results found. ____________________________________________    PROCEDURES  Procedure(s) performed: None  Procedures  Critical Care performed: None  ____________________________________________   INITIAL IMPRESSION / ASSESSMENT AND PLAN / ED COURSE  Pertinent labs & imaging results that were available during my care of the patient were reviewed by me and considered in my medical decision making (see chart for details).  Patient here with a perception of a rapid heart rate which got better after taking extra metoprolol.  No known history she states of dysrhythmia never worn a Holter monitor, her symptoms are consistent with possible atrial fib.  Patient did not check her pulse does not how to do so so we cannot tell how fast her heart rate actually was.   Differential does include CAD, although this would be a very atypical presentation for it her EKG is unchanged we will certainly send cardiac enzymes.  We will decide later whether repeat enzymes are indicated although since she is been having symptoms for several days I would be surprised if they were to change acutely in the emergency department.  In addition, patient does have a history of PE.  I do not think a d-dimer would  be useful the patient has had prior PE but I have very low suspicion that she is having a pulmonary embolism given the fact that she has no dyspnea no pleuritic chest pain and her symptoms get better with metoprolol.  Start with a chest x-ray, we will also check a urinalysis as a racing heart and someone her age could be a sign of an indolent infection, she is very well-appearing at this moment.  ----------------------------------------- 2:59 PM on 12/09/2017 -----------------------------------------  Potation's, we did do a CT scan of her chest which is negative fortunately.  Patient does have a mild urinary tract infection, and that could I guess account for some of the reason she feels generally a little bit unwell today.  However no evidence of urinary sepsis we did send a culture and I am giving her fosfomycin here.  No evidence of acute cardiopulmonary pathology, and a work-up is completely reassuring at this time.  I have nonetheless offered her admission to the hospital given her age and sensation of palpitations etc. for observation and she declines.  She understands that without observational stay we cannot really carry her work-up any further in the emergency room.  Patient states that she is peripherally fine with that.  Tomorrow is Thanksgiving and she is adamant that she does not want to stay in the hospital.  We will discharge her therefore home with return precautions and follow-up    ____________________________________________   FINAL CLINICAL IMPRESSION(S) / ED  DIAGNOSES  Final diagnoses:  None      This chart was dictated using voice recognition software.  Despite best efforts to proofread,  errors can occur which can change meaning.      Schuyler Amor, MD 12/09/17 1151    Schuyler Amor, MD 12/09/17 1501

## 2017-12-09 NOTE — Discharge Instructions (Addendum)
Prefer to spend the holiday home with your family which we do not think is unreasonable however does limit our ability to observe you.  Return to the emergency room for any new or worrisome symptoms, follow closely with cardiology.  If you have chest pain shortness of breath nausea vomiting diarrhea high fever or weakness the other new or worrisome symptoms please return to the ER.

## 2017-12-09 NOTE — ED Notes (Signed)
Patient transported to X-ray 

## 2017-12-12 LAB — URINE CULTURE: Culture: >=100000 — AB

## 2017-12-24 ENCOUNTER — Ambulatory Visit (INDEPENDENT_AMBULATORY_CARE_PROVIDER_SITE_OTHER): Payer: Medicare Other | Admitting: Urology

## 2017-12-24 ENCOUNTER — Encounter: Payer: Self-pay | Admitting: Urology

## 2017-12-24 VITALS — BP 92/55 | HR 64 | Ht 65.0 in | Wt 153.0 lb

## 2017-12-24 DIAGNOSIS — N281 Cyst of kidney, acquired: Secondary | ICD-10-CM | POA: Diagnosis not present

## 2017-12-24 DIAGNOSIS — N390 Urinary tract infection, site not specified: Secondary | ICD-10-CM

## 2017-12-24 LAB — URINALYSIS, COMPLETE
Bilirubin, UA: NEGATIVE
GLUCOSE, UA: NEGATIVE
Ketones, UA: NEGATIVE
NITRITE UA: NEGATIVE
PH UA: 6.5 (ref 5.0–7.5)
Protein, UA: NEGATIVE
RBC UA: NEGATIVE
Specific Gravity, UA: <1.005 — ABNORMAL LOW (ref 1.005–1.030)
Urobilinogen, Ur: 0.2 mg/dL (ref 0.2–1.0)

## 2017-12-24 LAB — MICROSCOPIC EXAMINATION: RBC MICROSCOPIC, UA: NONE SEEN /HPF (ref 0–2)

## 2017-12-24 NOTE — Progress Notes (Signed)
12/24/2017 2:55 PM  [place] 1934-03-04 [id number]  Referring provider: Rusty Aus, MD [facility] [place], [place] [address]  Chief Complaint  Patient presents with  . Follow-up    51month    HPI: Anita Bailey is a 83 yo F who returns today for follow-up of recurrent UTIs.  The patient's last visit with [place] was on 07/01/2017.   The patient reports of doing well overall.   She was seen in the ED late November 2019 complaining of palpitations and weakness.  She did have pyuria and was treated with Monurol however had no voiding symptoms or clinical evidence of infection.  She is presently asymptomatic.  Denies dysuria or gross hematuria.  PMH: Past Medical History:  Diagnosis Date  . Anemia   . Arthritis   . Cancer ([place])    hx of melanoma in 1971, basal cell   . Cervical disc disease   . Chronic kidney disease    current uti 08/05/11   . Depression   . DVT (deep venous thrombosis) ([place]) 01/2015   was Eliquis until 06/13/15-   . Dysrhythmia    hx of heart racing - in past, "none  in years".  had a stress test > 7 years  . History of recurrent UTIs    "bladder infection"  Had a PICC line in past  . Hyperlipidemia   . Hypertension   . Melanoma ([place])   . Nephrolithiasis   . Pulmonary emboli (HCC)    bilateral 8/12 hospitalized   . Vitamin D deficiency     Surgical History: Past Surgical History:  Procedure Laterality Date  . ABDOMINAL HYSTERECTOMY     1981  . BACK SURGERY    . BREAST EXCISIONAL BIOPSY Right 1979   Negative  . COLONOSCOPY W/ POLYPECTOMY    . COLONOSCOPY WITH PROPOFOL N/A 04/29/2017   Procedure: COLONOSCOPY WITH PROPOFOL;  Surgeon: [name], MD;  Location[facility] ENDOSCOPY;  Service: Endoscopy;  Laterality: N/A;  . JOINT REPLACEMENT     bilateral knee  . left wrist      surgery x 2   . OPEN REDUCTION INTERNAL FIXATION (ORIF) DISTAL RADIAL FRACTURE Right 06/30/2015   Procedure: OPEN REDUCTION  INTERNAL FIXATION (ORIF) RIGHT DISTAL RADIUS FRACTURE ;  Surgeon: [name], MD;  Location: [place];  Service: Orthopedics;  Laterality: Right;  . OTHER SURGICAL HISTORY Right    leg - melonoma  . SHOULDER ARTHROSCOPY WITH OPEN ROTATOR CUFF REPAIR Right 10/02/2017   Procedure: Right shoulder mini open rotator cuff repair;  Surgeon: Susa Day, MD;  Location: [facility];  Service: Orthopedics;  Laterality: Right;  90 mins  . TONSILLECTOMY    . TOTAL KNEE ARTHROPLASTY  08/11/2011   Procedure: TOTAL KNEE ARTHROPLASTY;  Surgeon: [name], MD;  Location: [facility];  Service: Orthopedics;  Laterality: Left;  . TOTAL KNEE ARTHROPLASTY Left 2010    Home Medications:  Allergies as of 12/24/2017      Reactions   Penicillins Anaphylaxis, Other (See Comments)   Has patient had a PCN reaction causing immediate rash, facial/tongue/throat swelling, SOB or lightheadedness with hypotension: yes Has patient had a PCN reaction causing severe rash involving mucus membranes or skin necrosis no Has patient had a PCN reaction that required hospitalization no Has patient had a PCN reaction occurring within the last 10 years: no If all of the above answers are "NO", then may proceed with Cephalosporin use.  Sulfa Antibiotics Other (See Comments), Itching, Rash   "pass out" Other reaction(s): Other (See Comments), Syncope Is able to take some sulfa abx per pt. But allergic to most syncope   Latex Rash, Other (See Comments)   Blisters   Monosodium Glutamate Other (See Comments)   Migraine headache   Glutamic Acid    Caffeine Swelling   Celebrex [celecoxib] Rash   Clindamycin/lincomycin Rash   Diclofenac Rash   Elastic Bandages & [zinc] Rash   Allergic to Elastic in underwear   Macrobid [nitrofurantoin Monohyd Macro] Rash   Tape Itching, Swelling, Other (See Comments)   Redness, burning      Medication List       Accurate as of December 24, 2017 11:59 PM. Always use your most recent med list.         cyanocobalamin 1000 MCG/ML injection Commonly known as:  (VITAMIN B-12) Inject 1,000 mcg into the muscle every 30 (thirty) days.   diclofenac sodium 1 % Gel Commonly known as:  VOLTAREN Apply 2 g topically 2 (two) times daily as needed (For pain.).   docusate sodium 100 MG capsule Commonly known as:  COLACE Take 1 capsule (100 mg total) by mouth 2 (two) times daily.   HYDROcodone-acetaminophen 5-325 MG tablet Commonly known as:  NORCO/VICODIN Take 1-2 tablets by mouth every 6 (six) hours as needed (for pain).   LYRICA 50 MG capsule Generic drug:  pregabalin Take 50 mg by mouth 2 (two) times daily.   metoprolol succinate 50 MG 24 hr tablet Commonly known as:  TOPROL-XL Take 50 mg by mouth 2 (two) times daily. Take with or immediately following a meal.   triamcinolone cream 0.1 % Commonly known as:  KENALOG Apply 1 application topically 2 (two) times daily as needed (for irritated/itchy skin).   venlafaxine XR 75 MG 24 hr capsule Commonly known as:  EFFEXOR-XR Take 75 mg by mouth daily.   vitamin C 1000 MG tablet Take 1,000 mg by mouth daily.   Vitamin D3 125 MCG (5000 UT) Tabs Take 5,000 Units by mouth daily.   zolpidem 5 MG tablet Commonly known as:  AMBIEN Take 2.5-5 mg by mouth at bedtime.       Allergies:  Allergies  Allergen Reactions  . Penicillins Anaphylaxis and Other (See Comments)    Has patient had a PCN reaction causing immediate rash, facial/tongue/throat swelling, SOB or lightheadedness with hypotension: yes Has patient had a PCN reaction causing severe rash involving mucus membranes or skin necrosis no Has patient had a PCN reaction that required hospitalization no Has patient had a PCN reaction occurring within the last 10 years: no If all of the above answers are "NO", then may proceed with Cephalosporin use.   . Sulfa Antibiotics Other (See Comments), Itching and Rash    "pass out" Other reaction(s): Other (See Comments), Syncope Is  able to take some sulfa abx per pt. But allergic to most syncope   . Latex Rash and Other (See Comments)    Blisters   . Monosodium Glutamate Other (See Comments)    Migraine headache  . Glutamic Acid   . Caffeine Swelling  . Celebrex [Celecoxib] Rash  . Clindamycin/Lincomycin Rash  . Diclofenac Rash  . Elastic Bandages & [Zinc] Rash    Allergic to Elastic in underwear  . Macrobid [Nitrofurantoin Monohyd Macro] Rash  . Tape Itching, Swelling and Other (See Comments)    Redness, burning    Family History: Family History  Problem Relation Age of  Onset  . Breast cancer Cousin     Social History:  reports that she has never smoked. She has never used smokeless tobacco. She reports current alcohol use of about 1.0 standard drinks of alcohol per week. She reports that she does not use drugs.  ROS: UROLOGY Frequent Urination?: No Hard to postpone urination?: Yes Burning/pain with urination?: No Get up at night to urinate?: Yes Leakage of urine?: No Urine stream starts and stops?: No Trouble starting stream?: No Do you have to strain to urinate?: No Blood in urine?: No Urinary tract infection?: No Sexually transmitted disease?: No Injury to kidneys or bladder?: No Painful intercourse?: No Weak stream?: No Currently pregnant?: No Vaginal bleeding?: No Last menstrual period?: n  Gastrointestinal Nausea?: No Vomiting?: No Indigestion/heartburn?: No Diarrhea?: No Constipation?: No  Constitutional Fever: No Night sweats?: No Weight loss?: No Fatigue?: No  Skin Skin rash/lesions?: No Itching?: No  Eyes Blurred vision?: No Double vision?: No  Ears/Nose/Throat Sore throat?: No Sinus problems?: No  Hematologic/Lymphatic Swollen glands?: No Easy bruising?: Yes  Cardiovascular Leg swelling?: No Chest pain?: No  Respiratory Cough?: No Shortness of breath?: No  Endocrine Excessive thirst?: Yes  Musculoskeletal Back pain?: No Joint pain?:  No  Neurological Headaches?: No Dizziness?: No  Psychologic Depression?: No Anxiety?: Yes  Physical Exam: BP (!) 92/55   Pulse 64   Ht 5\' 5"  (1.651 m)   Wt 153 lb (69.4 kg)   BMI 25.46 kg/m   Constitutional:  Alert and oriented, No acute distress. HEENT:  [place] AT, moist mucus membranes.  Trachea midline, no masses. Cardiovascular: No clubbing, cyanosis, or edema. Respiratory: Normal respiratory effort, no increased work of breathing. Skin: No rashes, bruises or suspicious lesions. Neurologic: Grossly intact, no focal deficits, moving all 4 extremities. Psychiatric: Normal mood and affect.  Urinalysis 11-30 WBC   Assessment & Plan:     1.  Recurrent UTIs Urinalysis today does show pyuria however she is asymptomatic and will not treat.  I suspect her positive culture in late November represented asymptomatic bacteriuria and was not a true clinical infection.  Continue low-dose vaginal estrogen.  Follow-up annually or as needed for symptomatic UTIs.  Return in about 1 year (around 12/25/2018) for f/u.   Abbie Sons, [place] [address] [place], [place] [address] [phone]  I, Lucas Mallow, am acting as a scribe for Dr. Nicki Reaper C. Talibah Colasurdo,  I, Abbie Sons, MD, have reviewed all documentation for this visit. The documentation on 12/24/17 for the exam, diagnosis, procedures, and orders are all accurate and complete.

## 2017-12-27 ENCOUNTER — Encounter: Payer: Self-pay | Admitting: Urology

## 2017-12-27 DIAGNOSIS — N390 Urinary tract infection, site not specified: Secondary | ICD-10-CM | POA: Insufficient documentation

## 2017-12-27 DIAGNOSIS — N281 Cyst of kidney, acquired: Secondary | ICD-10-CM | POA: Insufficient documentation

## 2018-01-22 ENCOUNTER — Other Ambulatory Visit: Payer: Self-pay | Admitting: Specialist

## 2018-01-22 DIAGNOSIS — M5416 Radiculopathy, lumbar region: Secondary | ICD-10-CM

## 2018-01-29 ENCOUNTER — Emergency Department
Admission: EM | Admit: 2018-01-29 | Discharge: 2018-01-29 | Disposition: A | Discharge status: Home / Self Care | Payer: Medicare Other | Attending: Emergency Medicine | Admitting: Emergency Medicine

## 2018-01-29 ENCOUNTER — Emergency Department: Payer: Medicare Other

## 2018-01-29 ENCOUNTER — Other Ambulatory Visit: Payer: Self-pay

## 2018-01-29 ENCOUNTER — Encounter: Payer: Self-pay | Admitting: Intensive Care

## 2018-01-29 DIAGNOSIS — Y9301 Activity, walking, marching and hiking: Secondary | ICD-10-CM | POA: Diagnosis not present

## 2018-01-29 DIAGNOSIS — Y999 Unspecified external cause status: Secondary | ICD-10-CM | POA: Diagnosis not present

## 2018-01-29 DIAGNOSIS — Z96653 Presence of artificial knee joint, bilateral: Secondary | ICD-10-CM | POA: Diagnosis not present

## 2018-01-29 DIAGNOSIS — S0990XA Unspecified injury of head, initial encounter: Secondary | ICD-10-CM

## 2018-01-29 DIAGNOSIS — Z86711 Personal history of pulmonary embolism: Secondary | ICD-10-CM | POA: Diagnosis not present

## 2018-01-29 DIAGNOSIS — S61412A Laceration without foreign body of left hand, initial encounter: Secondary | ICD-10-CM | POA: Insufficient documentation

## 2018-01-29 DIAGNOSIS — S022XXA Fracture of nasal bones, initial encounter for closed fracture: Secondary | ICD-10-CM | POA: Diagnosis not present

## 2018-01-29 DIAGNOSIS — I129 Hypertensive chronic kidney disease with stage 1 through stage 4 chronic kidney disease, or unspecified chronic kidney disease: Secondary | ICD-10-CM | POA: Insufficient documentation

## 2018-01-29 DIAGNOSIS — N189 Chronic kidney disease, unspecified: Secondary | ICD-10-CM | POA: Insufficient documentation

## 2018-01-29 DIAGNOSIS — W010XXA Fall on same level from slipping, tripping and stumbling without subsequent striking against object, initial encounter: Secondary | ICD-10-CM | POA: Insufficient documentation

## 2018-01-29 DIAGNOSIS — Y92018 Other place in single-family (private) house as the place of occurrence of the external cause: Secondary | ICD-10-CM | POA: Diagnosis not present

## 2018-01-29 DIAGNOSIS — S51012A Laceration without foreign body of left elbow, initial encounter: Secondary | ICD-10-CM

## 2018-01-29 DIAGNOSIS — Z85828 Personal history of other malignant neoplasm of skin: Secondary | ICD-10-CM | POA: Insufficient documentation

## 2018-01-29 DIAGNOSIS — Z79899 Other long term (current) drug therapy: Secondary | ICD-10-CM | POA: Diagnosis not present

## 2018-01-29 MED ORDER — LIDOCAINE HCL (PF) 1 % IJ SOLN
INTRAMUSCULAR | Status: AC
  Filled 2018-01-29: qty 5

## 2018-01-29 MED ORDER — LIDOCAINE HCL (PF) 1 % IJ SOLN
5.0000 mL | Freq: Once | INTRAMUSCULAR | Status: AC
  Administered 2018-01-29: 5 mL via INTRADERMAL
  Filled 2018-01-29: qty 5

## 2018-01-29 NOTE — ED Notes (Signed)
Pt verbalized understanding of d/c instructions, wound care, and f/u care. No further questions at this time, assisted to exit via wheelchair with family.

## 2018-01-29 NOTE — ED Notes (Signed)
ED Provider at bedside. 

## 2018-01-29 NOTE — ED Provider Notes (Signed)
[facility] Emergency Department Provider Note  ____________________________________________  Time seen: Approximately 7:50 PM  I have reviewed the triage vital signs and the nursing notes.   HISTORY  Chief Complaint Fall    HPI Anita Bailey is a 84 y.o. female with a history of PE, hypertension, CKD who comes the ED today after a trip and fall in her driveway over a brick paver.  Fell onto her left hand right knee and face.  Denies loss of consciousness or neck pain.  No paresthesias or motor weakness in the extremities.  Reports that her tetanus is up-to-date.  Complains of pain in the right knee and left elbow.      Past Medical History:  Diagnosis Date  . Anemia   . Arthritis   . Cancer ([place])    hx of melanoma in 1971, basal cell   . Cervical disc disease   . Chronic kidney disease    current uti 08/05/11   . Depression   . DVT (deep venous thrombosis) ([place]) 01/2015   was Eliquis until 06/13/15-   . Dysrhythmia    hx of heart racing - in past, "none  in years".  had a stress test > 7 years  . History of recurrent UTIs    "bladder infection"  Had a PICC line in past  . Hyperlipidemia   . Hypertension   . Melanoma ([place])   . Nephrolithiasis   . Pulmonary emboli ([facility])    bilateral 8/12 hospitalized   . Vitamin D deficiency      Patient Active Problem List   Diagnosis Date Noted  . Recurrent UTI 12/27/2017  . Renal cyst 12/27/2017  . Rotator cuff tear, right 10/02/2017  . Complete rotator cuff tear 10/02/2017  . Essential hypertension 07/31/2016  . Renal artery stenosis ([place]) 07/31/2016  . DJD (degenerative joint disease) 07/31/2016  . Distal radius fracture, right 06/30/2015  . S/P left TKA 08/11/2011     Past Surgical History:  Procedure Laterality Date  . ABDOMINAL HYSTERECTOMY     1981  . BACK SURGERY    . BREAST EXCISIONAL BIOPSY Right 1979   Negative  . COLONOSCOPY W/ POLYPECTOMY    . COLONOSCOPY WITH PROPOFOL N/A  04/29/2017   Procedure: COLONOSCOPY WITH PROPOFOL;  Surgeon: [name], MD;  Location: [facility] ENDOSCOPY;  Service: Endoscopy;  Laterality: N/A;  . JOINT REPLACEMENT     bilateral knee  . left wrist      surgery x 2   . OPEN REDUCTION INTERNAL FIXATION (ORIF) DISTAL RADIAL FRACTURE Right 06/30/2015   Procedure: OPEN REDUCTION INTERNAL FIXATION (ORIF) RIGHT DISTAL RADIUS FRACTURE ;  Surgeon: [name], MD;  Location: [place];  Service: Orthopedics;  Laterality: Right;  . OTHER SURGICAL HISTORY Right    leg - melonoma  . SHOULDER ARTHROSCOPY WITH OPEN ROTATOR CUFF REPAIR Right 10/02/2017   Procedure: Right shoulder mini open rotator cuff repair;  Surgeon: Susa Day, MD;  Location: [facility] ORS;  Service: Orthopedics;  Laterality: Right;  90 mins  . TONSILLECTOMY    . TOTAL KNEE ARTHROPLASTY  08/11/2011   Procedure: TOTAL KNEE ARTHROPLASTY;  Surgeon: [name], MD;  Location: [facility] ORS;  Service: Orthopedics;  Laterality: Left;  . TOTAL KNEE ARTHROPLASTY Left 2010     Prior to Admission medications   Medication Sig Start Date End Date Taking? Authorizing Provider  Ascorbic Acid (VITAMIN C) 1000 MG tablet Take 1,000 mg by mouth daily.    [provider]  Cholecalciferol (VITAMIN D3) 5000 units TABS Take 5,000 Units by mouth daily.    [provider]  cyanocobalamin (,VITAMIN B-12,) 1000 MCG/ML injection Inject 1,000 mcg into the muscle every 30 (thirty) days.    [provider]  diclofenac sodium (VOLTAREN) 1 % GEL Apply 2 g topically 2 (two) times daily as needed (For pain.).  05/07/15   [provider]  docusate sodium (COLACE) 100 MG capsule Take 1 capsule (100 mg total) by mouth 2 (two) times daily. 10/02/17 10/02/18  Cecilie Kicks, PA-C  HYDROcodone-acetaminophen (NORCO/VICODIN) 5-325 MG tablet Take 1-2 tablets by mouth every 6 (six) hours as needed (for pain). 10/02/17   Cecilie Kicks, PA-C  LYRICA 50 MG capsule Take 50 mg by mouth 2 (two)  times daily. 08/14/17   [provider]  metoprolol succinate (TOPROL-XL) 50 MG 24 hr tablet Take 50 mg by mouth 2 (two) times daily. Take with or immediately following a meal.    [provider]  triamcinolone cream (KENALOG) 0.1 % Apply 1 application topically 2 (two) times daily as needed (for irritated/itchy skin).  07/14/17   [provider]  venlafaxine XR (EFFEXOR-XR) 75 MG 24 hr capsule Take 75 mg by mouth daily. 06/05/15   [provider]  zolpidem (AMBIEN) 5 MG tablet Take 2.5-5 mg by mouth at bedtime.     [provider]     Allergies Penicillins; Sulfa antibiotics; Latex; Monosodium glutamate; Glutamic acid; Caffeine; Celebrex [celecoxib]; Clindamycin/lincomycin; Diclofenac; Elastic bandages & [zinc]; Macrobid [nitrofurantoin monohyd macro]; and Tape   Family History  Problem Relation Age of Onset  . Breast cancer Cousin     Social History Social History   Tobacco Use  . Smoking status: Never Smoker  . Smokeless tobacco: Never Used  Substance Use Topics  . Alcohol use: Yes    Alcohol/week: 1.0 standard drinks    Types: 1 Glasses of wine per week  . Drug use: No    Review of Systems  Constitutional:   No fever or chills.  ENT:   No sore throat. No rhinorrhea. Cardiovascular:   No chest pain or syncope. Respiratory:   No dyspnea or cough. Gastrointestinal:   Negative for abdominal pain, vomiting and diarrhea.  Musculoskeletal: Right knee pain, left elbow pain as above.  Left hand pain. All other systems reviewed and are negative except as documented above in ROS and HPI.  ____________________________________________   PHYSICAL EXAM:  VITAL SIGNS: ED Triage Vitals  Enc Vitals Group     BP 01/29/18 1646 (!) 177/79     Pulse Rate 01/29/18 1648 (!) 56     Resp 01/29/18 1648 16     Temp 01/29/18 1648 (!) 97.5 F (36.4 C)     Temp Source 01/29/18 1648 Oral     SpO2 01/29/18 1648 100 %     Weight 01/29/18 1649 145 lb  (65.8 kg)     Height 01/29/18 1649 5\' 5"  (1.651 m)     Head Circumference --      Peak Flow --      Pain Score 01/29/18 1649 4     Pain Loc --      Pain Edu? --      Excl. in [place]? --     Vital signs reviewed, nursing assessments reviewed.   Constitutional:   Alert and oriented. Non-toxic appearance. Eyes:   Conjunctivae are normal. EOMI. PERRL. ENT      Head:   Normocephalic with contusion  and swelling on the left side of the nose and maxilla.  No lacerations.      Nose:   No congestion/rhinnorhea.  No septal hematoma or epistaxis      Mouth/Throat:   MMM, no pharyngeal erythema. No peritonsillar mass.  No intraoral injury, no malocclusion      Neck:   No meningismus. Full ROM.  No midline spinal tenderness Hematological/Lymphatic/Immunilogical:   No cervical lymphadenopathy. Cardiovascular:   RRR. Symmetric bilateral radial and DP pulses.  No murmurs. Cap refill less than 2 seconds. Respiratory:   Normal respiratory effort without tachypnea/retractions. Breath sounds are clear and equal bilaterally. No wheezes/rales/rhonchi. Gastrointestinal:   Soft and nontender. Non distended. There is no CVA tenderness.  No rebound, rigidity, or guarding.  Musculoskeletal:   Normal range of motion in all extremities. No joint effusions.  No lower extremity tenderness.  No edema.  Chest wall and clavicle stable.  Pelvis stable.  Tenderness at the right knee over the proximal tibia without point tenderness.  No bony tenderness over the left elbow.  There is some tenderness at the left distal radius, not in the snuffbox, no tenderness of the hand.  No deformities.  Left hand and wrist tendon function intact. Neurologic:   Normal speech and language.  Motor grossly intact. No acute focal neurologic deficits are appreciated.  Skin:    Skin is warm, dry with a 1 cm superficial skin tear over the extensor surface of the left elbow, hemostatic.  4 cm curvilinear laceration on the left thenar eminence palmar  aspect.  No rash noted.  No petechiae, purpura, or bullae.  ____________________________________________    LABS (pertinent positives/negatives) (all labs ordered are listed, but only abnormal results are displayed) Labs Reviewed - No data to display ____________________________________________   EKG    ____________________________________________    RADIOLOGY  Dg Wrist Complete Left  Result Date: 01/29/2018 CLINICAL DATA:  Fall.  Wrist pain EXAM: LEFT WRIST - COMPLETE 3+ VIEW COMPARISON:  None. FINDINGS: Negative for acute fracture. Chronic fracture distal ulna with plate fixation. Mild degenerative change in the wrist. IMPRESSION: Negative for acute fracture. Electronically Signed   By: Franchot Gallo M.D.   On: 01/29/2018 17:39   Ct Head Wo Contrast  Result Date: 01/29/2018 CLINICAL DATA:  Status post fall today with facial abrasions. EXAM: CT HEAD WITHOUT CONTRAST CT MAXILLOFACIAL WITHOUT CONTRAST CT CERVICAL SPINE WITHOUT CONTRAST TECHNIQUE: Multidetector CT imaging of the head, cervical spine, and maxillofacial structures were performed using the standard protocol without intravenous contrast. Multiplanar CT image reconstructions of the cervical spine and maxillofacial structures were also generated. COMPARISON:  None. FINDINGS: CT HEAD FINDINGS Brain: No evidence of acute infarction, hemorrhage, hydrocephalus, extra-axial collection or mass lesion/mass effect. Vascular: No hyperdense vessel or unexpected calcification. Skull: Normal. Negative for fracture or focal lesion. Other: None. CT MAXILLOFACIAL FINDINGS Osseous: There is fracture of the left nasal bone with associated soft tissue swelling. No other acute fracture or dislocation is identified in the maxillofacial bones. Orbits: Negative. No traumatic or inflammatory finding. Sinuses: Clear. Soft tissues: Soft tissue swelling over the left nose. CT CERVICAL SPINE FINDINGS Alignment: Normal. Skull base and vertebrae: No acute  fracture. No primary bone lesion or focal pathologic process. Soft tissues and spinal canal: No prevertebral fluid or swelling. No visible canal hematoma. Disc levels: There are degenerative joint changes with narrowed joint space and osteophyte formation throughout cervical spine. Upper chest: Negative. Other: None. IMPRESSION: No focal acute intracranial abnormality identified. Fracture of left  nasal bone with associated soft tissue swelling. No acute fracture or dislocation of cervical spine. Degenerative joint changes of cervical spine. Electronically Signed   By: Abelardo Diesel M.D.   On: 01/29/2018 17:53   Ct Cervical Spine Wo Contrast  Result Date: 01/29/2018 CLINICAL DATA:  Status post fall today with facial abrasions. EXAM: CT HEAD WITHOUT CONTRAST CT MAXILLOFACIAL WITHOUT CONTRAST CT CERVICAL SPINE WITHOUT CONTRAST TECHNIQUE: Multidetector CT imaging of the head, cervical spine, and maxillofacial structures were performed using the standard protocol without intravenous contrast. Multiplanar CT image reconstructions of the cervical spine and maxillofacial structures were also generated. COMPARISON:  None. FINDINGS: CT HEAD FINDINGS Brain: No evidence of acute infarction, hemorrhage, hydrocephalus, extra-axial collection or mass lesion/mass effect. Vascular: No hyperdense vessel or unexpected calcification. Skull: Normal. Negative for fracture or focal lesion. Other: None. CT MAXILLOFACIAL FINDINGS Osseous: There is fracture of the left nasal bone with associated soft tissue swelling. No other acute fracture or dislocation is identified in the maxillofacial bones. Orbits: Negative. No traumatic or inflammatory finding. Sinuses: Clear. Soft tissues: Soft tissue swelling over the left nose. CT CERVICAL SPINE FINDINGS Alignment: Normal. Skull base and vertebrae: No acute fracture. No primary bone lesion or focal pathologic process. Soft tissues and spinal canal: No prevertebral fluid or swelling. No visible  canal hematoma. Disc levels: There are degenerative joint changes with narrowed joint space and osteophyte formation throughout cervical spine. Upper chest: Negative. Other: None. IMPRESSION: No focal acute intracranial abnormality identified. Fracture of left nasal bone with associated soft tissue swelling. No acute fracture or dislocation of cervical spine. Degenerative joint changes of cervical spine. Electronically Signed   By: Abelardo Diesel M.D.   On: 01/29/2018 17:53   Dg Knee Complete 4 Views Right  Result Date: 01/29/2018 CLINICAL DATA:  Fall. EXAM: RIGHT KNEE - COMPLETE 4+ VIEW COMPARISON:  None. FINDINGS: Total knee replacement. Negative for acute fracture. No prosthetic loosening. Negative for effusion. IMPRESSION: Total knee replacement.  Negative for fracture. Electronically Signed   By: Franchot Gallo M.D.   On: 01/29/2018 17:39   Ct Maxillofacial Wo Contrast  Result Date: 01/29/2018 CLINICAL DATA:  Status post fall today with facial abrasions. EXAM: CT HEAD WITHOUT CONTRAST CT MAXILLOFACIAL WITHOUT CONTRAST CT CERVICAL SPINE WITHOUT CONTRAST TECHNIQUE: Multidetector CT imaging of the head, cervical spine, and maxillofacial structures were performed using the standard protocol without intravenous contrast. Multiplanar CT image reconstructions of the cervical spine and maxillofacial structures were also generated. COMPARISON:  None. FINDINGS: CT HEAD FINDINGS Brain: No evidence of acute infarction, hemorrhage, hydrocephalus, extra-axial collection or mass lesion/mass effect. Vascular: No hyperdense vessel or unexpected calcification. Skull: Normal. Negative for fracture or focal lesion. Other: None. CT MAXILLOFACIAL FINDINGS Osseous: There is fracture of the left nasal bone with associated soft tissue swelling. No other acute fracture or dislocation is identified in the maxillofacial bones. Orbits: Negative. No traumatic or inflammatory finding. Sinuses: Clear. Soft tissues: Soft tissue swelling  over the left nose. CT CERVICAL SPINE FINDINGS Alignment: Normal. Skull base and vertebrae: No acute fracture. No primary bone lesion or focal pathologic process. Soft tissues and spinal canal: No prevertebral fluid or swelling. No visible canal hematoma. Disc levels: There are degenerative joint changes with narrowed joint space and osteophyte formation throughout cervical spine. Upper chest: Negative. Other: None. IMPRESSION: No focal acute intracranial abnormality identified. Fracture of left nasal bone with associated soft tissue swelling. No acute fracture or dislocation of cervical spine. Degenerative joint changes of cervical spine. Electronically Signed  By: Abelardo Diesel M.D.   On: 01/29/2018 17:53    ____________________________________________   PROCEDURES .Marland KitchenLaceration Repair Date/Time: 01/29/2018 7:55 PM Performed by: Carrie Mew, MD Authorized by: Carrie Mew, MD   Consent:    Consent obtained:  Verbal   Consent given by:  Patient   Risks discussed:  Infection, pain, retained foreign body, poor cosmetic result and poor wound healing   Alternatives discussed:  No treatment Anesthesia (see MAR for exact dosages):    Anesthesia method:  Local infiltration   Local anesthetic:  Lidocaine 1% w/o epi Laceration details:    Location:  Hand   Hand location:  L palm   Length (cm):  4 Repair type:    Repair type:  Simple Pre-procedure details:    Preparation:  Imaging obtained to evaluate for foreign bodies and patient was prepped and draped in usual sterile fashion Exploration:    Hemostasis achieved with:  Direct pressure   Wound exploration: entire depth of wound probed and visualized     Wound extent: no foreign bodies/material noted, no muscle damage noted, no nerve damage noted, no tendon damage noted, no underlying fracture noted and no vascular damage noted     Contaminated: no   Treatment:    Area cleansed with:  Saline and Betadine   Amount of cleaning:   Extensive   Irrigation solution:  Sterile saline   Irrigation method:  Syringe   Visualized foreign bodies/material removed: no   Skin repair:    Repair method:  Sutures   Suture size:  4-0   Wound skin closure material used: Monocryl.   Suture technique:  Running   Number of sutures:  7 Approximation:    Approximation:  Close Post-procedure details:    Dressing:  Sterile dressing and non-adherent dressing   Patient tolerance of procedure:  Tolerated well, no immediate complications    ____________________________________________  DIFFERENTIAL DIAGNOSIS   Intracranial hemorrhage, facial fracture, C-spine fracture, wrist fracture, right proximal tibia fracture  CLINICAL IMPRESSION / ASSESSMENT AND PLAN / ED COURSE  Pertinent labs & imaging results that were available during my care of the patient were reviewed by me and considered in my medical decision making (see chart for details).    Patient nontoxic, presents with blunt trauma to the face and multiple sites with a trip and fall.  CTs were unremarkable and afterward the patient had normal range of motion and nontender C-spine exam after removing the c-collar.  X-ray of left wrist and right knee also unremarkable.  Patient was able to ambulate without pain to the bathroom in the treatment room.  Wound was cleaned and repaired.  Skin tear laceration dressed.  With her history of DVT and PE which were provoked by trauma, I will have her take a baby aspirin for the next week to help protect from developing DVT.  Would not prophylactically put her on anticoagulation due to risk of worsening injuries if she should fall again.  Counseled on warning signs of DVT.  She has multiple grown children with her who are supportive and able to help care for her at home as she recovers.      ____________________________________________   FINAL CLINICAL IMPRESSION(S) / ED DIAGNOSES    Final diagnoses:  Closed fracture of nasal bone,  initial encounter  Injury of head, initial encounter  Skin tear of left elbow without complication, initial encounter  Laceration of left palm, initial encounter     ED Discharge Orders    None  Portions of this note were generated with dragon dictation software. Dictation errors may occur despite best attempts at proofreading.   Carrie Mew, MD 01/29/18 [phone]

## 2018-01-29 NOTE — ED Triage Notes (Signed)
Patient was taking groceries in at her house and tripped over her brick driveway. Patient has multiple bruises on face and abrasions from fall and skin tear on L thumb. Patient also fell on bilateral knees. HX bilateral total knee replacement. A&O X4 upon arrival to ER

## 2018-01-29 NOTE — Discharge Instructions (Signed)
Take a baby aspirin once a day starting tonight to help prevent DVT with your injury today.  Please follow-up with ENT for further evaluation of your nasal bone fracture.  There were no other significant injuries on your x-rays and CT scans today.

## 2018-01-29 NOTE — ED Notes (Signed)
MD at bedside for laceration repair.

## 2018-02-08 ENCOUNTER — Encounter: Payer: Medicare Other | Attending: Physician Assistant | Admitting: Physician Assistant

## 2018-02-08 ENCOUNTER — Ambulatory Visit
Admission: RE | Admit: 2018-02-08 | Discharge: 2018-02-08 | Disposition: A | Discharge status: Home / Self Care | Payer: Medicare Other | Source: Ambulatory Visit | Attending: Specialist | Admitting: Specialist

## 2018-02-08 DIAGNOSIS — M5416 Radiculopathy, lumbar region: Secondary | ICD-10-CM

## 2018-02-08 DIAGNOSIS — Z882 Allergy status to sulfonamides status: Secondary | ICD-10-CM | POA: Diagnosis not present

## 2018-02-08 DIAGNOSIS — Z91040 Latex allergy status: Secondary | ICD-10-CM | POA: Insufficient documentation

## 2018-02-08 DIAGNOSIS — W19XXXA Unspecified fall, initial encounter: Secondary | ICD-10-CM | POA: Insufficient documentation

## 2018-02-08 DIAGNOSIS — Z880 Allergy status to penicillin: Secondary | ICD-10-CM | POA: Insufficient documentation

## 2018-02-08 DIAGNOSIS — Z881 Allergy status to other antibiotic agents status: Secondary | ICD-10-CM | POA: Diagnosis not present

## 2018-02-08 DIAGNOSIS — S61402A Unspecified open wound of left hand, initial encounter: Secondary | ICD-10-CM | POA: Diagnosis present

## 2018-02-08 DIAGNOSIS — Z886 Allergy status to analgesic agent status: Secondary | ICD-10-CM | POA: Diagnosis not present

## 2018-02-08 MED ORDER — IOPAMIDOL (ISOVUE-M 200) INJECTION 41%
1.0000 mL | Freq: Once | INTRAMUSCULAR | Status: AC
  Administered 2018-02-08: 1 mL via EPIDURAL

## 2018-02-08 MED ORDER — METHYLPREDNISOLONE ACETATE 40 MG/ML INJ SUSP (RADIOLOG
120.0000 mg | Freq: Once | INTRAMUSCULAR | Status: AC
  Administered 2018-02-08: 120 mg via EPIDURAL

## 2018-02-09 NOTE — Progress Notes (Signed)
SHAKEDA, PEARSE ([id number]) Visit Report for 02/08/2018 Abuse/Suicide Risk Screen Details Patient Name: Anita Bailey, Anita Bailey. Date of Service: 02/08/2018 8:45 AM Medical Record Number: [id number] Patient Account Number: [id number] Date of Birth/Sex: 1935/01/09 (84 y.o. F) Treating RN: Montey Hora Primary Care Emerlyn Mehlhoff: Emily Filbert Other Clinician: Referring Mung Rinker: Paulita Cradle Treating Amarra Sawyer/Extender: Melburn Hake, HOYT Weeks in Treatment: 0 Abuse/Suicide Risk Screen Items Answer ABUSE/SUICIDE RISK SCREEN: Has anyone close to you tried to hurt or harm you recentlyo No Do you feel uncomfortable with anyone in your familyo No Has anyone forced you do things that you didnot want to doo No Do you have any thoughts of harming yourselfo No Patient displays signs or symptoms of abuse and/or neglect. No Electronic Signature(s) Signed: 02/08/2018 4:38:40 PM By: Montey Hora Entered By: Montey Hora on 02/08/2018 09:09:04 Anita Bailey ([id number]) -------------------------------------------------------------------------------- Activities of Daily Living Details Patient Name: Anita Bailey, Anita Bailey. Date of Service: 02/08/2018 8:45 AM Medical Record Number: [id number] Patient Account Number: [id number] Date of Birth/Sex: 1934-02-16 (84 y.o. F) Treating RN: Montey Hora Primary Care Chanti Golubski: Emily Filbert Other Clinician: Referring Clara Herbison: Paulita Cradle Treating Libia Fazzini/Extender: Melburn Hake, HOYT Weeks in Treatment: 0 Activities of Daily Living Items Answer Activities of Daily Living (Please select one for each item) Drive Automobile Completely Able Take Medications Completely Able Use Telephone Completely Able Care for Appearance Completely Able Use Toilet Completely Able Bath / Shower Completely Able Dress Self Completely Able Feed Self Completely Able Walk Completely Able Get In / Out Bed Completely Able Housework Completely Able Prepare Meals Completely [place] for Self Completely Able Electronic Signature(s) Signed: 02/08/2018 4:38:40 PM By: Montey Hora Entered By: Montey Hora on 02/08/2018 09:09:29 Anita Bailey ([id number]) -------------------------------------------------------------------------------- Education Assessment Details Patient Name: Anita Bailey, Anita Bailey. Date of Service: 02/08/2018 8:45 AM Medical Record Number: [id number] Patient Account Number: [id number] Date of Birth/Sex: May 17, 1934 (84 y.o. F) Treating RN: Montey Hora Primary Care Aarit Kashuba: Emily Filbert Other Clinician: Referring Miakoda Mcmillion: Paulita Cradle Treating Takeisha Cianci/Extender: Sharalyn Ink in Treatment: 0 Primary Learner Assessed: Patient Learning Preferences/Education Level/Primary Language Learning Preference: Explanation, Demonstration Highest Education Level: College or Above Preferred Language: English Cognitive Barrier Assessment/Beliefs Language Barrier: No Translator Needed: No Memory Deficit: No Emotional Barrier: No Cultural/Religious Beliefs Affecting Medical Care: No Physical Barrier Assessment Impaired Vision: No Impaired Hearing: No Decreased Hand dexterity: No Knowledge/Comprehension Assessment Knowledge Level: Medium Comprehension Level: Medium Ability to understand written Medium instructions: Ability to understand verbal Medium instructions: Motivation Assessment Anxiety Level: Calm Cooperation: Cooperative Education Importance: Acknowledges Need Interest in Health Problems: Asks Questions Perception: Coherent Willingness to Engage in Self- Medium Management Activities: Readiness to Engage in Self- Medium Management Activities: Electronic Signature(s) Signed: 02/08/2018 4:38:40 PM By: Montey Hora Entered By: Montey Hora on 02/08/2018 09:09:58 Anita Bailey ([id number]) -------------------------------------------------------------------------------- Fall Risk Assessment  Details Patient Name: Anita Bailey. Date of Service: 02/08/2018 8:45 AM Medical Record Number: [id number] Patient Account Number: [id number] Date of Birth/Sex: February 11, 1934 (84 y.o. F) Treating RN: Montey Hora Primary Care Delaila Nand: Emily Filbert Other Clinician: Referring Rosenda Geffrard: Paulita Cradle Treating Janeene Sand/Extender: Melburn Hake, HOYT Weeks in Treatment: 0 Fall Risk Assessment Items Have you had 2 or more falls in the last 12 monthso 0 No Have you had any fall that resulted in injury in the last 12 monthso 0 Yes FALL RISK ASSESSMENT: History of falling - immediate or within 3 months 25 Yes Secondary diagnosis 0 No Ambulatory aid None/bed rest/wheelchair/nurse 0 No Crutches/cane/walker 0 No Furniture  0 No IV Access/Saline Lock 0 No Gait/Training Normal/bed rest/immobile 0 No Weak 10 Yes Impaired 0 No Mental Status Oriented to own ability 0 Yes Electronic Signature(s) Signed: 02/08/2018 4:38:40 PM By: Montey Hora Entered By: Montey Hora on 02/08/2018 09:10:15 Anita Bailey ([id number]) -------------------------------------------------------------------------------- Foot Assessment Details Patient Name: Anita Bailey. Date of Service: 02/08/2018 8:45 AM Medical Record Number: [id number] Patient Account Number: [id number] Date of Birth/Sex: 1935/01/02 (84 y.o. F) Treating RN: Montey Hora Primary Care Patrisha Hausmann: Emily Filbert Other Clinician: Referring Marry Kusch: Paulita Cradle Treating Kais Monje/Extender: Melburn Hake, HOYT Weeks in Treatment: 0 Foot Assessment Items Site Locations + = Sensation present, - = Sensation absent, C = Callus, U = Ulcer R = Redness, W = Warmth, M = Maceration, PU = Pre-ulcerative lesion F = Fissure, S = Swelling, D = Dryness Assessment Right: Left: Other Deformity: No No Prior Foot Ulcer: No No Prior Amputation: No No Charcot Joint: No No Ambulatory Status: Ambulatory Without Help Gait: Steady Electronic Signature(s) Signed:  02/08/2018 4:38:40 PM By: Montey Hora Entered By: Montey Hora on 02/08/2018 [place], [place] ([id number]) -------------------------------------------------------------------------------- Nutrition Risk Assessment Details Patient Name: Anita Bailey, Anita Bailey. Date of Service: 02/08/2018 8:45 AM Medical Record Number: [id number] Patient Account Number: [id number] Date of Birth/Sex: 1934-05-24 (84 y.o. F) Treating RN: Montey Hora Primary Care Rosalin Buster: Emily Filbert Other Clinician: Referring Tashea Othman: Paulita Cradle Treating Jailynn Lavalais/Extender: Melburn Hake, HOYT Weeks in Treatment: 0 Height (in): 65 Weight (lbs): 154 Body Mass Index (BMI): 25.6 Nutrition Risk Assessment Items NUTRITION RISK SCREEN: I have an illness or condition that made me change the kind and/or amount of 0 No food I eat I eat fewer than two meals per day 0 No I eat few fruits and vegetables, or milk products 0 No I have three or more drinks of beer, liquor or wine almost every day 0 No I have tooth or mouth problems that make it hard for me to eat 0 No I don't always have enough money to buy the food I need 0 No I eat alone most of the time 0 No I take three or more different prescribed or over-the-counter drugs a day 1 Yes Without wanting to, I have lost or gained 10 pounds in the last six months 0 No I am not always physically able to shop, cook and/or feed myself 0 No Nutrition Protocols Good Risk Protocol 0 No interventions needed Moderate Risk Protocol Electronic Signature(s) Signed: 02/08/2018 4:38:40 PM By: Montey Hora Entered By: Montey Hora on 02/08/2018 09:10:21

## 2018-02-14 NOTE — Progress Notes (Signed)
MEARA, WIECHMAN ([id number]) Visit Report for 02/08/2018 Chief Complaint Document Details Patient Name: Anita Bailey, Anita Bailey. Date of Service: 02/08/2018 8:45 AM Medical Record Number: [id number] Patient Account Number: [id number] Date of Birth/Sex: 1934/07/06 (84 y.o. F) Treating RN: Cornell Barman Primary Care Provider: Emily Filbert Other Clinician: Referring Provider: Paulita Cradle Treating Provider/Extender: Melburn Hake, Emmerie Battaglia Weeks in Treatment: 0 Information Obtained from: Patient Chief Complaint Left hand traumatic ulcer Electronic Signature(s) Signed: 02/12/2018 9:05:33 PM By: Worthy Keeler PA-C Entered By: Worthy Keeler on 02/08/2018 09:38:01 Anita Bailey ([id number]) -------------------------------------------------------------------------------- Debridement Details Patient Name: Anita Bailey. Date of Service: 02/08/2018 8:45 AM Medical Record Number: [id number] Patient Account Number: [id number] Date of Birth/Sex: February 19, 1934 (84 y.o. F) Treating RN: Cornell Barman Primary Care Provider: Emily Filbert Other Clinician: Referring Provider: Paulita Cradle Treating Provider/Extender: Melburn Hake, Caily Rakers Weeks in Treatment: 0 Debridement Performed for Wound #1 Left Hand - Palm Assessment: Performed By: Physician STONE III, Lee-Anne Flicker E., PA-C Debridement Type: Debridement Level of Consciousness (Pre- Awake and Alert procedure): Pre-procedure Verification/Time Yes - 09:44 Out Taken: Start Time: 09:44 Pain Control: Lidocaine Total Area Debrided (L x W): 2 (cm) x 0.2 (cm) = 0.4 (cm) Tissue and other material Viable, Non-Viable, Slough, Subcutaneous, Skin: Dermis , Slough debrided: Level: Skin/Subcutaneous Tissue Debridement Description: Excisional Instrument: Curette Bleeding: Minimum Hemostasis Achieved: Pressure End Time: 09:46 Response to Treatment: Procedure was tolerated well Level of Consciousness Awake and Alert (Post-procedure): Post Debridement Measurements of Total  Wound Length: (cm) 2 Width: (cm) 0.2 Depth: (cm) 0.2 Volume: (cm) 0.063 Character of Wound/Ulcer Post Debridement: Requires Further Debridement Post Procedure Diagnosis Same as Pre-procedure Electronic Signature(s) Signed: 02/08/2018 4:45:46 PM By: Gretta Cool, BSN, RN, CWS, Kim RN, BSN Signed: 02/12/2018 9:05:33 PM By: Worthy Keeler PA-C Entered By: Gretta Cool, BSN, RN, CWS, Kim on 02/08/2018 09:47:36 Anita Bailey ([id number]) -------------------------------------------------------------------------------- HPI Details Patient Name: Anita, Bailey. Date of Service: 02/08/2018 8:45 AM Medical Record Number: [id number] Patient Account Number: [id number] Date of Birth/Sex: 11/10/1934 (84 y.o. F) Treating RN: Cornell Barman Primary Care Provider: Emily Filbert Other Clinician: Referring Provider: Paulita Cradle Treating Provider/Extender: Melburn Hake, Charlean Carneal Weeks in Treatment: 0 History of Present Illness HPI Description: 02/08/18 on evaluation today patient presents for initial evaluation our clinic concerning issues that she has been having with her left hand. She had a fall where she subsequently fractured her nose as well is potentially fractured bone which was bruised. She is on 81 mg aspirin though she has not been taking it since the injury. She also injured her left [place] where she has a skin tear. This was sutured but unfortunately she states that she really did not feel like it was an excellent job. She has been very frustrated with this. She really has no major medical problems otherwise. Overall she has been doing fairly well which is good news. The sutures are still in place and will need to be removed today. No fevers, chills, nausea, or vomiting noted at this time. Electronic Signature(s) Signed: 02/12/2018 9:05:33 PM By: Worthy Keeler PA-C Entered By: Worthy Keeler on 02/12/2018 19:55:23 Anita Bailey  ([id number]) -------------------------------------------------------------------------------- Physical Exam Details Patient Name: Anita, Bailey. Date of Service: 02/08/2018 8:45 AM Medical Record Number: [id number] Patient Account Number: [id number] Date of Birth/Sex: 01-15-1934 (84 y.o. F) Treating RN: Cornell Barman Primary Care Provider: Emily Filbert Other Clinician: Referring Provider: Paulita Cradle Treating Provider/Extender: Melburn Hake, Ellena Kamen Weeks in Treatment: 0 Constitutional sitting or  standing blood pressure is within target range for patient.. pulse regular and within target range for patient.[name] respirations regular, non-labored and within target range for patient.[name] temperature within target range for patient.. Well- nourished and well-hydrated in no acute distress. Eyes conjunctiva clear no eyelid edema noted. pupils equal round and reactive to light and accommodation. Ears, Nose, Mouth, and Throat no gross abnormality of ear auricles or external auditory canals. normal hearing noted during conversation. mucus membranes moist. Respiratory normal breathing without difficulty. clear to auscultation bilaterally. Cardiovascular regular rate and rhythm with normal S1, S2. no clubbing, cyanosis, significant edema, <3 sec cap refill. Gastrointestinal (GI) soft, non-tender, non-distended, +BS. no ventral hernia noted. Musculoskeletal normal gait and posture. no significant deformity or arthritic changes, no loss or range of motion, no clubbing. Psychiatric this patient is able to make decisions and demonstrates good insight into disease process. Alert and Oriented x 3. pleasant and cooperative. Notes Currently patient's wound did require removal of the sutures which was performed today really these were not to intact as it stands they were unraveling already. Nonetheless I did remove these and subsequently was able to clean the wound including some necrotic tissue around the edge  of the wound. She tolerated all this well today without complication and there was some dead tissue in the central portion of the region of the wound which had to be evacuated including what appear to be a little bit of hematoma. Post debridement the wound bed appears to be doing significantly better which is excellent news. Electronic Signature(s) Signed: 02/12/2018 9:05:33 PM By: Worthy Keeler PA-C Entered By: Worthy Keeler on 02/12/2018 19:56:10 Anita Bailey ([id number]) -------------------------------------------------------------------------------- Physician Orders Details Patient Name: CIENNA, DUMAIS. Date of Service: 02/08/2018 8:45 AM Medical Record Number: [id number] Patient Account Number: [id number] Date of Birth/Sex: May 21, 1934 (84 y.o. F) Treating RN: Cornell Barman Primary Care Provider: Emily Filbert Other Clinician: Referring Provider: Paulita Cradle Treating Provider/Extender: Melburn Hake, Dory Demont Weeks in Treatment: 0 Verbal / Phone Orders: No Diagnosis Coding ICD-10 Coding Code Description S61.402A Unspecified open wound of left hand, initial encounter Wound Cleansing Wound #1 Left Hand - Palm o Cleanse wound with mild soap and water Anesthetic (add to Medication List) Wound #1 Left Hand - Palm o Topical Lidocaine 4% cream applied to wound bed prior to debridement (In Clinic Only). Primary Wound Dressing Wound #1 Left Hand - Palm o Silver Collagen Secondary Dressing Wound #1 Left Hand - Palm o Gauze and Kerlix/Conform - Secured with Coban Dressing Change Frequency Wound #1 Left Hand - Palm o Change dressing every other day. Follow-up Appointments Wound #1 Left Hand - Palm o Return Appointment in 1 week. Additional Orders / Instructions Wound #1 Left Hand - Palm o Activity as tolerated Electronic Signature(s) Signed: 02/08/2018 4:45:46 PM By: Gretta Cool, BSN, RN, CWS, Kim RN, BSN Signed: 02/12/2018 9:05:33 PM By: Worthy Keeler PA-C Entered By:  Gretta Cool, BSN, RN, CWS, Kim on 02/08/2018 09:49:10 Anita Bailey ([id number]) -------------------------------------------------------------------------------- Problem List Details Patient Name: JAVAYA, OREGON. Date of Service: 02/08/2018 8:45 AM Medical Record Number: [id number] Patient Account Number: [id number] Date of Birth/Sex: 23-Aug-1934 (84 y.o. F) Treating RN: Cornell Barman Primary Care Provider: Emily Filbert Other Clinician: Referring Provider: Paulita Cradle Treating Provider/Extender: Melburn Hake, Rasa Degrazia Weeks in Treatment: 0 Active Problems ICD-10 Evaluated Encounter Code Description Active Date Today Diagnosis S61.402A Unspecified open wound of left hand, initial encounter 02/08/2018 No Yes Inactive Problems Resolved Problems Electronic Signature(s) Signed: 02/12/2018 9:05:33 PM By: Joaquim Lai  III, Elery Cadenhead PA-C Entered By: Worthy Keeler on 02/08/2018 09:37:25 Anita Bailey ([id number]) -------------------------------------------------------------------------------- Progress Note Details Patient Name: OPIE, FANTON. Date of Service: 02/08/2018 8:45 AM Medical Record Number: [id number] Patient Account Number: [id number] Date of Birth/Sex: [birth year] (84 y.o. F) Treating RN: Cornell Barman Primary Care Provider: Emily Filbert Other Clinician: Referring Provider: Paulita Cradle Treating Provider/Extender: Melburn Hake, Nachum Derossett Weeks in Treatment: 0 Subjective Chief Complaint Information obtained from Patient Left hand traumatic ulcer History of Present Illness (HPI) 02/08/18 on evaluation today patient presents for initial evaluation our clinic concerning issues that she has been having with her left hand. She had a fall where she subsequently fractured her nose as well is potentially fractured bone which was bruised. She is on 81 mg aspirin though she has not been taking it since the injury. She also injured her left palm [place] farm where she has a skin tear. This was sutured  but unfortunately she states that she really did not feel like it was an excellent job. She has been very frustrated with this. She really has no major medical problems otherwise. Overall she has been doing fairly well which is good news. The sutures are still in place and will need to be removed today. No fevers, chills, nausea, or vomiting noted at this time. Wound History Patient presents with 1 open wound that has been present for approximately 10 days. Patient has been treating wound in the following manner: xeroform gauze and bandaid. Laboratory tests have been performed in the last month. Patient reportedly has not tested positive for an antibiotic resistant organism. Patient reportedly has not tested positive for osteomyelitis. Patient reportedly has not had testing performed to evaluate circulation in the legs. Patient History Information obtained from Patient. Allergies penicillin, adhesive, caffeine, Celebrex, clindamycin HCl, diclofenac, latex, monosodium glutamate, nitrofurantoin, Sulfa (Sulfonamide Antibiotics) Family History No family history of Cancer, Diabetes, Heart Disease, Hereditary Spherocytosis, Hypertension, Kidney Disease, Lung Disease, Seizures, Stroke, Thyroid Problems, Tuberculosis. Social History Never smoker, Marital Status - Widowed, Alcohol Use - Rarely, Drug Use - No History, Caffeine Use - Daily. Medical History Eyes Denies history of Cataracts, Glaucoma, Optic Neuritis Ear/Nose/Mouth/Throat Denies history of Chronic sinus problems/congestion, Middle ear problems Hematologic/Lymphatic Denies history of Anemia, Hemophilia, Human Immunodeficiency Virus, Lymphedema, Sickle Cell Disease Respiratory Denies history of Aspiration, Asthma, Chronic Obstructive Pulmonary Disease (COPD), Pneumothorax, Sleep Apnea, Tuberculosis Cardiovascular TAMMERA, ENGERT. ([id number]) Patient has history of Arrhythmia - PAT, Deep Vein Thrombosis Denies history of Angina,  Congestive Heart Failure, Coronary Artery Disease, Hypertension, Hypotension, Myocardial Infarction, Peripheral Arterial Disease, Peripheral Venous Disease, Phlebitis, Vasculitis Gastrointestinal Denies history of Cirrhosis , Colitis, Crohn s, Hepatitis A, Hepatitis B, Hepatitis C Endocrine Denies history of Type I Diabetes, Type II Diabetes Genitourinary Denies history of End Stage Renal Disease Immunological Denies history of Lupus Erythematosus, Raynaud s, Scleroderma Integumentary (Skin) Denies history of History of Burn, History of pressure wounds Musculoskeletal Denies history of Gout, Rheumatoid Arthritis, Osteoarthritis, Osteomyelitis Neurologic Denies history of Dementia, Neuropathy, Quadriplegia, Paraplegia, Seizure Disorder Oncologic Denies history of Received Chemotherapy, Received Radiation Psychiatric Denies history of Anorexia/bulimia, Confinement Anxiety Medical And Surgical History Notes Ear/Nose/Mouth/Throat broken nose Cardiovascular PE, HLD Musculoskeletal cervical disc disease Neurologic currently has a concussion Oncologic melamona Review of Systems (ROS) Constitutional Symptoms (General Health) Denies complaints or symptoms of Fatigue, Fever, Chills, Marked Weight Change. Eyes Complains or has symptoms of Glasses / Contacts. Denies complaints or symptoms of Dry Eyes, Vision Changes. Ear/Nose/Mouth/Throat Denies complaints or  symptoms of Difficult clearing ears, Sinusitis. Hematologic/Lymphatic Denies complaints or symptoms of Bleeding / Clotting Disorders, Human Immunodeficiency Virus. Respiratory Denies complaints or symptoms of Chronic or frequent coughs, Shortness of Breath. Cardiovascular Denies complaints or symptoms of Chest pain, LE edema. Gastrointestinal Denies complaints or symptoms of Frequent diarrhea, Nausea, Vomiting. Endocrine Denies complaints or symptoms of Hepatitis, Thyroid disease, Polydypsia (Excessive  Thirst). Genitourinary Denies complaints or symptoms of Kidney failure/ Dialysis, Incontinence/dribbling. Immunological Denies complaints or symptoms of Hives, Itching. Integumentary (Skin) Complains or has symptoms of Wounds. Denies complaints or symptoms of Bleeding or bruising tendency, Breakdown, Swelling. Musculoskeletal JANNAE, FAGERSTROM. ([id number]) Denies complaints or symptoms of Muscle Pain, Muscle Weakness. Neurologic Denies complaints or symptoms of Numbness/parasthesias, Focal/Weakness. Psychiatric Denies complaints or symptoms of Anxiety, Claustrophobia. Objective Constitutional sitting or standing blood pressure is within target range for patient.. pulse regular and within target range for patient.[name] respirations regular, non-labored and within target range for patient.[name] temperature within target range for patient.. Well- nourished and well-hydrated in no acute distress. Vitals Time Taken: 9:02 AM, Height: 65 in, Source: Stated, Weight: 154 lbs, Source: Measured, BMI: 25.6, Temperature: 98.1 F, Pulse: 51 bpm, Respiratory Rate: 16 breaths/min, Blood Pressure: 119/58 mmHg. Eyes conjunctiva clear no eyelid edema noted. pupils equal round and reactive to light and accommodation. Ears, Nose, Mouth, and Throat no gross abnormality of ear auricles or external auditory canals. normal hearing noted during conversation. mucus membranes moist. Respiratory normal breathing without difficulty. clear to auscultation bilaterally. Cardiovascular regular rate and rhythm with normal S1, S2. no clubbing, cyanosis, significant edema, Gastrointestinal (GI) soft, non-tender, non-distended, +BS. no ventral hernia noted. Musculoskeletal normal gait and posture. no significant deformity or arthritic changes, no loss or range of motion, no clubbing. Psychiatric this patient is able to make decisions and demonstrates good insight into disease process. Alert and Oriented x 3. pleasant and  cooperative. General Notes: Currently patient's wound did require removal of the sutures which was performed today really these were not to intact as it stands they were unraveling already. Nonetheless I did remove these and subsequently was able to clean the wound including some necrotic tissue around the edge of the wound. She tolerated all this well today without complication and there was some dead tissue in the central portion of the region of the wound which had to be evacuated including what appear to be a little bit of hematoma. Post debridement the wound bed appears to be doing significantly better which is excellent news. Integumentary (Hair, Skin) Wound #1 status is Open. Original cause of wound was Trauma. The wound is located on the Left Hand - Palm. The wound measures 2cm length x 0.2cm width x 0.2cm depth; 0.314cm^2 area and 0.063cm^3 volume. There is Fat Layer (Subcutaneous Tissue) Exposed exposed. There is no tunneling noted, however, there is undermining starting at 10:00 and OLEAN, SANGSTER. ([id number]) ending at 2:00 with a maximum distance of 0.6cm. There is a medium amount of serous drainage noted. The wound margin is flat and intact. There is small (1-33%) red granulation within the wound bed. There is a large (67-100%) amount of necrotic tissue within the wound bed including Adherent Slough. The periwound skin appearance exhibited: Erythema. The periwound skin appearance did not exhibit: Callus, Crepitus, Excoriation, Induration, Rash, Scarring, Dry/Scaly, Maceration, Atrophie Blanche, Cyanosis, Ecchymosis, Hemosiderin Staining, Mottled, Pallor, Rubor. The surrounding wound skin color is noted with erythema which is circumferential. Periwound temperature was noted as No Abnormality. The periwound has tenderness on palpation. Assessment Active  Problems ICD-10 Unspecified open wound of left hand, initial encounter Procedures Wound #1 Pre-procedure diagnosis of Wound #1  is a Trauma, Other located on the Left Hand - Palm . There was a Excisional Skin/Subcutaneous Tissue Debridement with a total area of 0.4 sq cm performed by STONE III, Maraki Macquarrie E., PA-C. With the following instrument(s): Curette to remove Viable and Non-Viable tissue/material. Material removed includes Subcutaneous Tissue, Slough, and Skin: Dermis after achieving pain control using Lidocaine. No specimens were taken. A time out was conducted at 09:44, prior to the start of the procedure. A Minimum amount of bleeding was controlled with Pressure. The procedure was tolerated well. Post Debridement Measurements: 2cm length x 0.2cm width x 0.2cm depth; 0.063cm^3 volume. Character of Wound/Ulcer Post Debridement requires further debridement. Post procedure Diagnosis Wound #1: Same as Pre-Procedure Plan Wound Cleansing: Wound #1 Left Hand - Palm: Cleanse wound with mild soap and water Anesthetic (add to Medication List): Wound #1 Left Hand - Palm: Topical Lidocaine 4% cream applied to wound bed prior to debridement (In Clinic Only). Primary Wound Dressing: Wound #1 Left Hand - Palm: Silver Collagen Secondary Dressing: Wound #1 Left Hand - Palm: Gauze and Kerlix/Conform - Secured with Coban Dressing Change Frequency: Wound #1 Left Hand - Palm: Change dressing every other day. KALANY, DIEKMANN [place] ([id number]) Follow-up Appointments: Wound #1 Left Hand - Palm: Return Appointment in 1 week. Additional Orders / Instructions: Wound #1 Left Hand - Palm: Activity as tolerated My suggestion currently was that we go ahead and initiate the above wound care measures which the patient was in agreement with today. We will subsequently see were things stand at follow-up in one weeks time. She likewise is in agreement with this as well. If anything changes in the interim she will contact the office and let me know. Please see above for specific wound care orders. We will see patient for re-evaluation in 1  week(s) here in the clinic. If anything worsens or changes patient will contact our office for additional recommendations. Electronic Signature(s) Signed: 02/12/2018 9:05:33 PM By: Worthy Keeler PA-C Entered By: Worthy Keeler on 02/12/2018 19:57:02 Anita Bailey ([id number]) -------------------------------------------------------------------------------- ROS/PFSH Details Patient Name: FRAIDA, VELDMAN. Date of Service: 02/08/2018 8:45 AM Medical Record Number: [id number] Patient Account Number: [id number] Date of Birth/Sex: 07-21-1934 (84 y.o. F) Treating RN: Montey Hora Primary Care Provider: Emily Filbert Other Clinician: Referring Provider: Paulita Cradle Treating Provider/Extender: Melburn Hake, Lora Glomski Weeks in Treatment: 0 Information Obtained From Patient Wound History Do you currently have one or more open woundso Yes How many open wounds do you currently haveo 1 Approximately how long have you had your woundso 10 days How have you been treating your wound(s) until nowo xeroform gauze and bandaid Has your wound(s) ever healed and then re-openedo No Have you had any lab work done in the past montho Yes Who ordered the lab work doneo PCP Have you tested positive for an antibiotic resistant organism (MRSA, VRE)o No Have you tested positive for osteomyelitis (bone infection)o No Have you had any tests for circulation on your legso No Constitutional Symptoms (General Health) Complaints and Symptoms: Negative for: Fatigue; Fever; Chills; Marked Weight Change Eyes Complaints and Symptoms: Positive for: Glasses / Contacts Negative for: Dry Eyes; Vision Changes Medical History: Negative for: Cataracts; Glaucoma; Optic Neuritis Ear/Nose/Mouth/Throat Complaints and Symptoms: Negative for: Difficult clearing ears; Sinusitis Medical History: Negative for: Chronic sinus problems/congestion; Middle ear problems Past Medical History Notes: broken  nose Hematologic/Lymphatic Complaints and Symptoms: Negative  for: Bleeding / Clotting Disorders; Human Immunodeficiency Virus Medical History: Negative for: Anemia; Hemophilia; Human Immunodeficiency Virus; Lymphedema; Sickle Cell Disease Respiratory HARLEYQUINN, GASSER. ([id number]) Complaints and Symptoms: Negative for: Chronic or frequent coughs; Shortness of Breath Medical History: Negative for: Aspiration; Asthma; Chronic Obstructive Pulmonary Disease (COPD); Pneumothorax; Sleep Apnea; Tuberculosis Cardiovascular Complaints and Symptoms: Negative for: Chest pain; LE edema Medical History: Positive for: Arrhythmia - PAT; Deep Vein Thrombosis Negative for: Angina; Congestive Heart Failure; Coronary Artery Disease; Hypertension; Hypotension; Myocardial Infarction; Peripheral Arterial Disease; Peripheral Venous Disease; Phlebitis; Vasculitis Past Medical History Notes: PE, HLD Gastrointestinal Complaints and Symptoms: Negative for: Frequent diarrhea; Nausea; Vomiting Medical History: Negative for: Cirrhosis ; Colitis; Crohnos; Hepatitis A; Hepatitis B; Hepatitis C Endocrine Complaints and Symptoms: Negative for: Hepatitis; Thyroid disease; Polydypsia (Excessive Thirst) Medical History: Negative for: Type I Diabetes; Type II Diabetes Genitourinary Complaints and Symptoms: Negative for: Kidney failure/ Dialysis; Incontinence/dribbling Medical History: Negative for: End Stage Renal Disease Immunological Complaints and Symptoms: Negative for: Hives; Itching Medical History: Negative for: Lupus Erythematosus; Raynaudos; Scleroderma Integumentary (Skin) Complaints and Symptoms: Positive for: Wounds Negative for: Bleeding or bruising tendency; Breakdown; Swelling Medical History: Negative for: History of Burn; History of pressure wounds JEANNELLE, WIENS. ([id number]) Musculoskeletal Complaints and Symptoms: Negative for: Muscle Pain; Muscle Weakness Medical History: Negative  for: Gout; Rheumatoid Arthritis; Osteoarthritis; Osteomyelitis Past Medical History Notes: cervical disc disease Neurologic Complaints and Symptoms: Negative for: Numbness/parasthesias; Focal/Weakness Medical History: Negative for: Dementia; Neuropathy; Quadriplegia; Paraplegia; Seizure Disorder Past Medical History Notes: currently has a concussion Psychiatric Complaints and Symptoms: Negative for: Anxiety; Claustrophobia Medical History: Negative for: Anorexia/bulimia; Confinement Anxiety Oncologic Medical History: Negative for: Received Chemotherapy; Received Radiation Past Medical History Notes: melamona Immunizations Pneumococcal Vaccine: Received Pneumococcal Vaccination: Yes Immunization Notes: up to date Implantable Devices Family and Social History Cancer: No; Diabetes: No; Heart Disease: No; Hereditary Spherocytosis: No; Hypertension: No; Kidney Disease: No; Lung Disease: No; Seizures: No; Stroke: No; Thyroid Problems: No; Tuberculosis: No; Never smoker; Marital Status - Widowed; Alcohol Use: Rarely; Drug Use: No History; Caffeine Use: Daily; Financial Concerns: No; Food, Clothing or Shelter Needs: No; Support System Lacking: No; Transportation Concerns: No; Advanced Directives: No; Patient does not want information on Advanced Directives Electronic Signature(s) Signed: 02/08/2018 4:38:40 PM By: Montey Hora Signed: 02/12/2018 9:05:33 PM By: Worthy Keeler PA-C Entered By: Montey Hora on 02/08/2018 09:17:24 Anita Bailey ([id number]) -------------------------------------------------------------------------------- [place] Details Patient Name: TRANISE, FORREST. Date of Service: 02/08/2018 Medical Record Number: [id number] Patient Account Number: [id number] Date of Birth/Sex: August 15, 1934 (84 y.o. F) Treating RN: Cornell Barman Primary Care Provider: Emily Filbert Other Clinician: Referring Provider: Paulita Cradle Treating Provider/Extender: Melburn Hake,  Deontre Allsup Weeks in Treatment: 0 Diagnosis Coding ICD-10 Codes Code Description S61.402A Unspecified open wound of left hand, initial encounter Facility Procedures CPT4 Code: 98338250 Description: [facility] [facility] VISIT-LEV 3 EST PT Modifier: Quantity: 1 CPT4 Code: 53976734 Description: 19379 - DEB SUBQ TISSUE 20 SQ CM/< ICD-10 Diagnosis Description S61.402A Unspecified open wound of left hand, initial encounter Modifier: Quantity: 1 Physician Procedures CPT4 Code: 0240973 Description: WC PHYS LEVEL 3 o NEW PT ICD-10 Diagnosis Description Z32.992E Unspecified open wound of left hand, initial encounter Modifier: 25 Quantity: 1 CPT4 Code: 2683419 Description: 62229 - WC PHYS SUBQ TISS 20 SQ CM ICD-10 Diagnosis Description N98.921J Unspecified open wound of left hand, initial encounter Modifier: Quantity: 1 Electronic Signature(s) Signed: 02/12/2018 9:05:33 PM By: Worthy Keeler PA-C Entered By: Worthy Keeler on 02/08/2018 23:40:05

## 2018-02-14 NOTE — Progress Notes (Signed)
KAITLINN, IVERSEN ([id number]) Visit Report for 02/08/2018 Allergy List Details Patient Name: Anita Bailey, Anita Bailey. Date of Service: 02/08/2018 8:45 AM Medical Record Number: [id number] Patient Account Number: [id number] Date of Birth/Sex: 07-04-1934 (84 y.o. F) Treating RN: Montey Hora Primary Care Yasir Kitner: Emily Filbert Other Clinician: Referring Stesha Neyens: Paulita Cradle Treating Gasper Hopes/Extender: Melburn Hake, HOYT Weeks in Treatment: 0 Allergies Active Allergies penicillin adhesive caffeine Celebrex clindamycin HCl diclofenac latex monosodium glutamate nitrofurantoin Sulfa (Sulfonamide Antibiotics) Allergy Notes Electronic Signature(s) Signed: 02/08/2018 9:30:01 AM By: Montey Hora Entered By: Montey Hora on 02/08/2018 09:30:00 [name] ([id number]) -------------------------------------------------------------------------------- Arrival Information Details Patient Name: NAYELI, CALVERT. Date of Service: 02/08/2018 8:45 AM Medical Record Number: [id number] Patient Account Number: [id number] Date of Birth/Sex: 22-Dec-1934 (84 y.o. F) Treating RN: Cornell Barman Primary Care Hagop Mccollam: Emily Filbert Other Clinician: Referring Mikya Don: Paulita Cradle Treating Stephens Shreve/Extender: Melburn Hake, HOYT Weeks in Treatment: 0 Visit Information Patient Arrived: Crutches Arrival Time: 09:01 Accompanied By: daughter in law Transfer Assistance: None Patient Identification Verified: Yes Secondary Verification Process Completed: Yes Electronic Signature(s) Signed: 02/08/2018 2:36:35 PM By: Lorine Bears RCP, RRT, CHT Entered By: Lorine Bears on 02/08/2018 09:02:28 [name] ([id number]) -------------------------------------------------------------------------------- Clinic Level of Care Assessment Details Patient Name: RICHA, SHOR. Date of Service: 02/08/2018 8:45 AM Medical Record Number: [id number] Patient Account Number: [id number] Date of  Birth/Sex: 27-Apr-1934 (84 y.o. F) Treating RN: Cornell Barman Primary Care Damoni Causby: Emily Filbert Other Clinician: Referring Kasper Mudrick: Paulita Cradle Treating Joycelin Radloff/Extender: Melburn Hake, HOYT Weeks in Treatment: 0 Clinic Level of Care Assessment Items TOOL 1 Quantity Score []  - Use when EandM and Procedure is performed on INITIAL visit 0 ASSESSMENTS - Nursing Assessment / Reassessment X - General Physical Exam (combine w/ comprehensive assessment (listed just below) when 1 20 performed on new pt. evals) X- 1 25 Comprehensive Assessment (HX, ROS, Risk Assessments, Wounds Hx, etc.) ASSESSMENTS - Wound and Skin Assessment / Reassessment []  - Dermatologic / Skin Assessment (not related to wound area) 0 ASSESSMENTS - Ostomy and/or Continence Assessment and Care []  - Incontinence Assessment and Management 0 []  - 0 Ostomy Care Assessment and Management (repouching, etc.) PROCESS - Coordination of Care X - Simple Patient / Family Education for ongoing care 1 15 []  - 0 Complex (extensive) Patient / Family Education for ongoing care X- 1 10 Staff obtains Programmer, systems, Records, Test Results / Process Orders []  - 0 Staff telephones HHA, Nursing Homes / Clarify orders / etc []  - 0 Routine Transfer to another Facility (non-emergent condition) []  - 0 Routine Hospital Admission (non-emergent condition) X- 1 15 New Admissions / Biomedical engineer / Ordering NPWT, Apligraf, etc. []  - 0 Emergency Hospital Admission (emergent condition) PROCESS - Special Needs []  - Pediatric / Minor Patient Management 0 []  - 0 Isolation Patient Management []  - 0 Hearing / Language / Visual special needs []  - 0 Assessment of Community assistance (transportation, D/C planning, etc.) []  - 0 Additional assistance / Altered mentation []  - 0 Support Surface(s) Assessment (bed, cushion, seat, etc.) AUBRIELLA, PEREZGARCIA ([id number]) INTERVENTIONS - Miscellaneous []  - External ear exam 0 []  - 0 Patient Transfer  (multiple staff / Civil Service fast streamer / Similar devices) []  - 0 Simple Staple / Suture removal (25 or less) []  - 0 Complex Staple / Suture removal (26 or more) []  - 0 Hypo/Hyperglycemic Management (do not check if billed separately) []  - 0 Ankle / Brachial Index (ABI) - do not check if billed separately Has the patient been seen at the  hospital within the last three years: Yes Total Score: 85 Level Of Care: New/Established - Level 3 Electronic Signature(s) Signed: 02/08/2018 4:45:46 PM By: Gretta Cool, BSN, RN, CWS, Kim RN, BSN Entered By: Gretta Cool, BSN, RN, CWS, Kim on 02/08/2018 11:26:57 [name] ([id number]) -------------------------------------------------------------------------------- Encounter Discharge Information Details Patient Name: SABRYNA, LAHM. Date of Service: 02/08/2018 8:45 AM Medical Record Number: [id number] Patient Account Number: [id number] Date of Birth/Sex: 1934/05/27 (84 y.o. F) Treating RN: Cornell Barman Primary Care Mickie Kozikowski: Emily Filbert Other Clinician: Referring Marvine Encalade: Paulita Cradle Treating Shakeisha Horine/Extender: Melburn Hake, HOYT Weeks in Treatment: 0 Encounter Discharge Information Items Post Procedure Vitals Discharge Condition: Stable Temperature (F): 98.1 Ambulatory Status: Ambulatory Pulse (bpm): 51 Discharge Destination: Home Respiratory Rate (breaths/min): 16 Transportation: Private Auto Blood Pressure (mmHg): 119/58 Accompanied By: daughter in law Schedule Follow-up Appointment: No Clinical Summary of Care: Electronic Signature(s) Signed: 02/08/2018 4:45:46 PM By: Gretta Cool, BSN, RN, CWS, Kim RN, BSN Entered By: Gretta Cool, BSN, RN, CWS, Kim on 02/08/2018 09:51:53 [name] ([id number]) -------------------------------------------------------------------------------- Lower Extremity Assessment Details Patient Name: KATTALEYA, ALIA. Date of Service: 02/08/2018 8:45 AM Medical Record Number: [id number] Patient Account Number: [id number] Date of Birth/Sex:  June 18, 1934 (84 y.o. F) Treating RN: Montey Hora Primary Care Brnadon Eoff: Emily Filbert Other Clinician: Referring Fatim Vanderschaaf: Paulita Cradle Treating Hogan Hoobler/Extender: Melburn Hake, HOYT Weeks in Treatment: 0 Electronic Signature(s) Signed: 02/08/2018 4:38:40 PM By: Montey Hora Entered By: Montey Hora on 02/08/2018 09:10:45 [name] ([id number]) -------------------------------------------------------------------------------- Multi Wound Chart Details Patient Name: PHYLLISS, STREGE. Date of Service: 02/08/2018 8:45 AM Medical Record Number: [id number] Patient Account Number: [id number] Date of Birth/Sex: Jun 18, 1934 (84 y.o. F) Treating RN: Cornell Barman Primary Care Taleah Bellantoni: Emily Filbert Other Clinician: Referring Larna Capelle: Paulita Cradle Treating Analee Montee/Extender: Melburn Hake, HOYT Weeks in Treatment: 0 Vital Signs Height(in): 65 Pulse(bpm): 51 Weight(lbs): 154 Blood Pressure(mmHg): 119/58 Body Mass Index(BMI): 26 Temperature(F): 98.1 Respiratory Rate 16 (breaths/min): Photos: [1:No Photos] [N/A:N/A] Wound Location: [1:Left Hand - Palm] [N/A:N/A] Wounding Event: [1:Trauma] [N/A:N/A] Primary Etiology: [1:Trauma, Other] [N/A:N/A] Comorbid History: [1:Arrhythmia, Deep Vein Thrombosis] [N/A:N/A] Date Acquired: [1:01/28/2018] [N/A:N/A] Weeks of Treatment: [1:0] [N/A:N/A] Wound Status: [1:Open] [N/A:N/A] Measurements L x W x D [1:2x0.2x0.2] [N/A:N/A] (cm) Area (cm) : [1:0.314] [N/A:N/A] Volume (cm) : [1:0.063] [N/A:N/A] Classification: [1:Full Thickness Without Exposed Support Structures] [N/A:N/A] Exudate Amount: [1:Medium] [N/A:N/A] Exudate Type: [1:Serous] [N/A:N/A] Exudate Color: [1:amber] [N/A:N/A] Wound Margin: [1:Flat and Intact] [N/A:N/A] Granulation Amount: [1:Small (1-33%)] [N/A:N/A] Granulation Quality: [1:Red] [N/A:N/A] Necrotic Amount: [1:Large (67-100%)] [N/A:N/A] Exposed Structures: [1:Fat Layer (Subcutaneous Tissue) Exposed: Yes Fascia: No  Tendon: No Muscle: No Joint: No Bone: No] [N/A:N/A] Epithelialization: [1:None] [N/A:N/A] Periwound Skin Texture: [1:Excoriation: No Induration: No Callus: No Crepitus: No Rash: No Scarring: No] [N/A:N/A] Periwound Skin Moisture: [N/A:N/A] Maceration: No Dry/Scaly: No Periwound Skin Color: Erythema: Yes N/A N/A Atrophie Blanche: No Cyanosis: No Ecchymosis: No Hemosiderin Staining: No Mottled: No Pallor: No Rubor: No Erythema Location: Circumferential N/A N/A Temperature: No Abnormality N/A N/A Tenderness on Palpation: Yes N/A N/A Wound Preparation: Ulcer Cleansing: N/A N/A Rinsed/Irrigated with Saline Topical Anesthetic Applied: Other: lidocaine 4% Treatment Notes Electronic Signature(s) Signed: 02/08/2018 4:45:46 PM By: Gretta Cool, BSN, RN, CWS, Kim RN, BSN Entered By: Gretta Cool, BSN, RN, CWS, Kim on 02/08/2018 09:41:13 [name] ([id number]) -------------------------------------------------------------------------------- [place] Details Patient Name: NARIAH, MORGANO. Date of Service: 02/08/2018 8:45 AM Medical Record Number: [id number] Patient Account Number: [id number] Date of Birth/Sex: [birth year] (84 y.o. F) Treating RN: Cornell Barman Primary Care Hilary Pundt: Emily Filbert Other Clinician: Referring Noralyn Karim:  MCLAUGHLIN, MIRIAM Treating Brenyn Petrey/Extender: STONE III, HOYT Weeks in Treatment: 0 Active Inactive Abuse / Safety / Falls / Self Care Management Nursing Diagnoses: History of Falls Goals: Patient will remain injury free related to falls Date Initiated: 02/08/2018 Target Resolution Date: 02/12/2018 Goal Status: Active Interventions: Assess fall risk on admission and as needed Notes: Orientation to the Wound Care Program Nursing Diagnoses: Knowledge deficit related to the wound healing center program Goals: Patient/caregiver will verbalize understanding of the [place] Program Date Initiated: 02/08/2018 Target Resolution Date:  02/12/2018 Goal Status: Active Interventions: Provide education on orientation to the wound center Notes: Soft Tissue Infection Nursing Diagnoses: Impaired tissue integrity Goals: Patient will remain free of wound infection Date Initiated: 02/08/2018 Target Resolution Date: 02/12/2018 Goal Status: Active Interventions: Assess signs and symptoms of infection every visit EATHEL, PAJAK ([id number]) Notes: Wound/Skin Impairment Nursing Diagnoses: Impaired tissue integrity Goals: Ulcer/skin breakdown will have a volume reduction of 30% by week 4 Date Initiated: 02/08/2018 Target Resolution Date: 03/11/2018 Goal Status: Active Interventions: Assess ulceration(s) every visit Treatment Activities: Refer to smoking cessation program : 02/08/2018 Notes: Electronic Signature(s) Signed: 02/08/2018 4:45:46 PM By: Gretta Cool, BSN, RN, CWS, Kim RN, BSN Entered By: Gretta Cool, BSN, RN, CWS, Kim on 02/08/2018 09:40:54 [name] ([id number]) -------------------------------------------------------------------------------- Pain Assessment Details Patient Name: GLADYS, DECKARD. Date of Service: 02/08/2018 8:45 AM Medical Record Number: [id number] Patient Account Number: [id number] Date of Birth/Sex: Jan 12, 1935 (84 y.o. F) Treating RN: Cornell Barman Primary Care Antuan Limes: Emily Filbert Other Clinician: Referring Abrianna Sidman: Paulita Cradle Treating Larysa Pall/Extender: Melburn Hake, HOYT Weeks in Treatment: 0 Active Problems Location of Pain Severity and Description of Pain Patient Has Paino No Site Locations Pain Management and Medication Current Pain Management: Electronic Signature(s) Signed: 02/08/2018 2:36:35 PM By: Lorine Bears RCP, RRT, CHT Signed: 02/08/2018 4:45:46 PM By: Gretta Cool, BSN, RN, CWS, Kim RN, BSN Entered By: Lorine Bears on 02/08/2018 09:02:45 [name]  ([id number]) -------------------------------------------------------------------------------- Patient/Caregiver Education Details Patient Name: DEOLA, REWIS. Date of Service: 02/08/2018 8:45 AM Medical Record Number: [id number] Patient Account Number: [id number] Date of Birth/Gender: [birth year] (84 y.o. F) Treating RN: Cornell Barman Primary Care Physician: Emily Filbert Other Clinician: Referring Physician: Paulita Cradle Treating Physician/Extender: Sharalyn Ink in Treatment: 0 Education Assessment Education Provided To: Patient Education Topics Provided Wound/Skin Impairment: Handouts: Caring for Your Ulcer Methods: Demonstration, Explain/Verbal Responses: State content correctly Electronic Signature(s) Signed: 02/08/2018 4:45:46 PM By: Gretta Cool, BSN, RN, CWS, Kim RN, BSN Entered By: Gretta Cool, BSN, RN, CWS, Kim on 02/08/2018 09:52:39 [name] ([id number]) -------------------------------------------------------------------------------- Wound Assessment Details Patient Name: KYREE, FEDORKO. Date of Service: 02/08/2018 8:45 AM Medical Record Number: [id number] Patient Account Number: [id number] Date of Birth/Sex: [birth year] (84 y.o. F) Treating RN: Montey Hora Primary Care Taten Merrow: Emily Filbert Other Clinician: Referring Newell Wafer: Paulita Cradle Treating Jaidah Lomax/Extender: Melburn Hake, HOYT Weeks in Treatment: 0 Wound Status Wound Number: 1 Primary Etiology: Trauma, Other Wound Location: Left Hand - Palm Wound Status: Open Wounding Event: Trauma Comorbid History: Arrhythmia, Deep Vein Thrombosis Date Acquired: 01/28/2018 Weeks Of Treatment: 0 Clustered Wound: No Photos Wound Measurements Length: (cm) 2 % Reduct Width: (cm) 0.2 % Reduct Depth: (cm) 0.2 Epitheli Area: (cm) 0.314 Tunneli Volume: (cm) 0.063 Undermi Start Endin Maxim ion in Area: 0% ion in Volume: 0% alization: None ng: No ning: Yes ing Position (o'clock): 10 g Position (o'clock):  2 um Distance: (cm) 0.6 Wound Description Full Thickness Without Exposed Support Foul Odo Classification: Structures Slough/F Wound Margin: Flat and  Intact Exudate Medium Amount: Exudate Type: Serous Exudate Color: amber r After Cleansing: No ibrino Yes Wound Bed Granulation Amount: Small (1-33%) Exposed Structure Granulation Quality: Red Fascia Exposed: No Necrotic Amount: Large (67-100%) Fat Layer (Subcutaneous Tissue) Exposed: Yes Necrotic Quality: Adherent Slough Tendon Exposed: No PATRISIA, FAETH ([id number]) Muscle Exposed: No Joint Exposed: No Bone Exposed: No Periwound Skin Texture Texture Color No Abnormalities Noted: No No Abnormalities Noted: No Callus: No Atrophie Blanche: No Crepitus: No Cyanosis: No Excoriation: No Ecchymosis: No Induration: No Erythema: Yes Rash: No Erythema Location: Circumferential Scarring: No Hemosiderin Staining: No Mottled: No Moisture Pallor: No No Abnormalities Noted: No Rubor: No Dry / Scaly: No Maceration: No Temperature / Pain Temperature: No Abnormality Tenderness on Palpation: Yes Wound Preparation Ulcer Cleansing: Rinsed/Irrigated with Saline Topical Anesthetic Applied: Other: lidocaine 4%, Treatment Notes Wound #1 (Left Hand - Palm) 1. Cleansed with: Clean wound with Normal Saline 2. Anesthetic Topical Lidocaine 4% cream to wound bed prior to debridement 4. Dressing Applied: Prisma Ag Notes Prisma Ag, Gauze, conform, compress with coban. Electronic Signature(s) Signed: 02/08/2018 4:38:40 PM By: Montey Hora Signed: 02/08/2018 4:45:46 PM By: Gretta Cool, BSN, RN, CWS, Kim RN, BSN Entered By: Gretta Cool, BSN, RN, CWS, Kim on 02/08/2018 09:52:21 GRICELDA, FOLAND ([id number]) -------------------------------------------------------------------------------- [place] Details Patient Name: KADESHIA, KASPARIAN. Date of Service: 02/08/2018 8:45 AM Medical Record Number: [id number] Patient Account Number: [id number] Date of  Birth/Sex: [birth year] (84 y.o. F) Treating RN: Cornell Barman Primary Care Destine Ambroise: Emily Filbert Other Clinician: Referring Roshad Hack: Paulita Cradle Treating Lareta Bruneau/Extender: Melburn Hake, HOYT Weeks in Treatment: 0 Vital Signs Time Taken: 09:02 Temperature (F): 98.1 Height (in): 65 Pulse (bpm): 51 Source: Stated Respiratory Rate (breaths/min): 16 Weight (lbs): 154 Blood Pressure (mmHg): 119/58 Source: Measured Reference Range: 80 - 120 mg / dl Body Mass Index (BMI): 25.6 Airway Electronic Signature(s) Signed: 02/08/2018 2:36:35 PM By: Lorine Bears RCP, RRT, CHT Entered By: Lorine Bears on 02/08/2018 09:06:41

## 2018-02-15 ENCOUNTER — Encounter: Payer: Medicare Other | Attending: Physician Assistant | Admitting: Physician Assistant

## 2018-02-15 DIAGNOSIS — W19XXXA Unspecified fall, initial encounter: Secondary | ICD-10-CM | POA: Diagnosis not present

## 2018-02-15 DIAGNOSIS — S61402A Unspecified open wound of left hand, initial encounter: Secondary | ICD-10-CM | POA: Diagnosis present

## 2018-02-15 DIAGNOSIS — Z86718 Personal history of other venous thrombosis and embolism: Secondary | ICD-10-CM | POA: Diagnosis not present

## 2018-02-15 DIAGNOSIS — Z86711 Personal history of pulmonary embolism: Secondary | ICD-10-CM | POA: Diagnosis not present

## 2018-02-19 NOTE — Progress Notes (Signed)
EBONE, ALCIVAR ([id number]) Visit Report for 02/15/2018 Arrival Information Details Patient Name: Anita Bailey, Anita Bailey. Date of Service: 02/15/2018 9:45 AM Medical Record Number: [id number] Patient Account Number: [id number] Date of Birth/Sex: 03-18-1934 (84 y.o. Female) Treating RN: Montey Hora Primary Care Natajah Derderian: Emily Filbert Other Clinician: Referring Herta Hink: Emily Filbert Treating Lamekia Nolden/Extender: Melburn Hake, HOYT Weeks in Treatment: 1 Visit Information History Since Last Visit Added or deleted any medications: No Patient Arrived: Kasandra Knudsen Any new allergies or adverse reactions: No Arrival Time: 09:41 Had a fall or experienced change in No Accompanied By: self activities of daily living that may affect Transfer Assistance: None risk of falls: Patient Identification Verified: Yes Signs or symptoms of abuse/neglect since last visito No Secondary Verification Process Completed: Yes Hospitalized since last visit: No Implantable device outside of the clinic excluding No cellular tissue based products placed in the center since last visit: Has Dressing in Place as Prescribed: Yes Pain Present Now: No Electronic Signature(s) Signed: 02/15/2018 4:27:23 PM By: Montey Hora Entered By: Montey Hora on 02/15/2018 09:41:24 Anita Bailey ([id number]) -------------------------------------------------------------------------------- Encounter Discharge Information Details Patient Name: Anita Bailey, Anita Bailey. Date of Service: 02/15/2018 9:45 AM Medical Record Number: [id number] Patient Account Number: [id number] Date of Birth/Sex: April 10, 1934 (84 y.o. Female) Treating RN: Cornell Barman Primary Care Atziri Zubiate: Emily Filbert Other Clinician: Referring Macil Crady: Emily Filbert Treating Bentzion Dauria/Extender: Melburn Hake, HOYT Weeks in Treatment: 1 Encounter Discharge Information Items Post Procedure Vitals Discharge Condition: Stable Unable to obtain vitals Reason: Pt left before obtaining Ambulatory Status:  Crutches Discharge Destination: Home Transportation: Private Auto Accompanied By: self Schedule Follow-up Appointment: Yes Clinical Summary of Care: Electronic Signature(s) Signed: 02/15/2018 5:20:52 PM By: Gretta Cool, BSN, RN, CWS, Kim RN, BSN Entered By: Gretta Cool, BSN, RN, CWS, Kim on 02/15/2018 10:18:48 Anita Bailey ([id number]) -------------------------------------------------------------------------------- Lower Extremity Assessment Details Patient Name: Anita Bailey, Anita Bailey. Date of Service: 02/15/2018 9:45 AM Medical Record Number: [id number] Patient Account Number: [id number] Date of Birth/Sex: 1934/04/19 (84 y.o. Female) Treating RN: Montey Hora Primary Care Joreen Swearingin: Emily Filbert Other Clinician: Referring Shamera Yarberry: Emily Filbert Treating Johnanthony Wilden/Extender: Melburn Hake, HOYT Weeks in Treatment: 1 Electronic Signature(s) Signed: 02/15/2018 4:27:23 PM By: Montey Hora Entered By: Montey Hora on 02/15/2018 09:51:15 Anita Bailey ([id number]) -------------------------------------------------------------------------------- Multi Wound Chart Details Patient Name: Anita Bailey, Anita Bailey. Date of Service: 02/15/2018 9:45 AM Medical Record Number: [id number] Patient Account Number: [id number] Date of Birth/Sex: August 28, 1934 (84 y.o. Female) Treating RN: Harold Barban Primary Care Memory Heinrichs: Emily Filbert Other Clinician: Referring Braelon Sprung: Emily Filbert Treating Altagracia Rone/Extender: Melburn Hake, HOYT Weeks in Treatment: 1 Vital Signs Height(in): 65 Pulse(bpm): 48 Weight(lbs): 154 Blood Pressure(mmHg): 153/64 Body Mass Index(BMI): 26 Temperature(F): Respiratory Rate 16 (breaths/min): Photos: [1:No Photos] [N/A:N/A] Wound Location: [1:Left Hand - Palm] [N/A:N/A] Wounding Event: [1:Trauma] [N/A:N/A] Primary Etiology: [1:Trauma, Other] [N/A:N/A] Comorbid History: [1:Arrhythmia, Deep Vein Thrombosis] [N/A:N/A] Date Acquired: [1:01/28/2018] [N/A:N/A] Weeks of Treatment: [1:1] [N/A:N/A] Wound Status:  [1:Open] [N/A:N/A] Measurements L x W x D [1:2x0.2x0.1] [N/A:N/A] (cm) Area (cm) : [1:0.314] [N/A:N/A] Volume (cm) : [1:0.031] [N/A:N/A] % Reduction in Area: [1:0.00%] [N/A:N/A] % Reduction in Volume: [1:50.80%] [N/A:N/A] Classification: [1:Full Thickness Without Exposed Support Structures] [N/A:N/A] Exudate Amount: [1:Medium] [N/A:N/A] Exudate Type: [1:Serous] [N/A:N/A] Exudate Color: [1:amber] [N/A:N/A] Wound Margin: [1:Flat and Intact] [N/A:N/A] Granulation Amount: [1:Small (1-33%)] [N/A:N/A] Granulation Quality: [1:Red] [N/A:N/A] Necrotic Amount: [1:Large (67-100%)] [N/A:N/A] Exposed Structures: [1:Fat Layer (Subcutaneous Tissue) Exposed: Yes Fascia: No Tendon: No Muscle: No Joint: No Bone: No] [N/A:N/A] Epithelialization: [1:None] [N/A:N/A] Periwound Skin Texture: [1:Excoriation: No Induration: No Callus: No Crepitus:  No] [N/A:N/A] Rash: No Scarring: No Periwound Skin Moisture: Maceration: No N/A N/A Dry/Scaly: No Periwound Skin Color: Erythema: Yes N/A N/A Atrophie Blanche: No Cyanosis: No Ecchymosis: No Hemosiderin Staining: No Mottled: No Pallor: No Rubor: No Erythema Location: Circumferential N/A N/A Temperature: No Abnormality N/A N/A Tenderness on Palpation: Yes N/A N/A Wound Preparation: Ulcer Cleansing: N/A N/A Rinsed/Irrigated with Saline Topical Anesthetic Applied: Other: lidocaine 4% Treatment Notes Electronic Signature(s) Signed: 02/18/2018 11:43:02 AM By: Harold Barban Entered By: Harold Barban on 02/15/2018 09:59:57 Anita Bailey ([id number]) -------------------------------------------------------------------------------- [place] Details Patient Name: Anita Bailey, Anita Bailey. Date of Service: 02/15/2018 9:45 AM Medical Record Number: [id number] Patient Account Number: [id number] Date of Birth/Sex: 19-Apr-1934 (84 y.o. Female) Treating RN: Harold Barban Primary Care Jedrek Dinovo: Emily Filbert Other Clinician: Referring Leniyah Martell:  Emily Filbert Treating Anden Bartolo/Extender: Melburn Hake, HOYT Weeks in Treatment: 1 Active Inactive Abuse / Safety / Falls / Self Care Management Nursing Diagnoses: History of Falls Goals: Patient will remain injury free related to falls Date Initiated: 02/08/2018 Target Resolution Date: 02/12/2018 Goal Status: Active Interventions: Assess fall risk on admission and as needed Notes: Orientation to the Wound Care Program Nursing Diagnoses: Knowledge deficit related to the wound healing center program Goals: Patient/caregiver will verbalize understanding of the [facility] [facility] Program Date Initiated: 02/08/2018 Target Resolution Date: 02/12/2018 Goal Status: Active Interventions: Provide education on orientation to the wound center Notes: Soft Tissue Infection Nursing Diagnoses: Impaired tissue integrity Goals: Patient will remain free of wound infection Date Initiated: 02/08/2018 Target Resolution Date: 02/12/2018 Goal Status: Active Interventions: Assess signs and symptoms of infection every visit Anita Bailey, Anita Bailey ([id number]) Notes: Wound/Skin Impairment Nursing Diagnoses: Impaired tissue integrity Goals: Ulcer/skin breakdown will have a volume reduction of 30% by week 4 Date Initiated: 02/08/2018 Target Resolution Date: 03/11/2018 Goal Status: Active Interventions: Assess ulceration(s) every visit Treatment Activities: Refer to smoking cessation program : 02/08/2018 Notes: Electronic Signature(s) Signed: 02/18/2018 11:43:02 AM By: Harold Barban Entered By: Harold Barban on 02/15/2018 09:59:49 Anita Bailey ([id number]) -------------------------------------------------------------------------------- Pain Assessment Details Patient Name: Anita Bailey, Anita Bailey. Date of Service: 02/15/2018 9:45 AM Medical Record Number: [id number] Patient Account Number: [id number] Date of Birth/Sex: 02/11/1934 (84 y.o. Female) Treating RN: Montey Hora Primary Care Pascual Mantel: Emily Filbert  Other Clinician: Referring Madolin Twaddle: Emily Filbert Treating Aimy Sweeting/Extender: Melburn Hake, HOYT Weeks in Treatment: 1 Active Problems Location of Pain Severity and Description of Pain Patient Has Paino Yes Site Locations Pain Location: Pain in Ulcers With Dressing Change: Yes Duration of the Pain. Constant / Intermittento Constant Pain Management and Medication Current Pain Management: Electronic Signature(s) Signed: 02/15/2018 4:27:23 PM By: Montey Hora Entered By: Montey Hora on 02/15/2018 09:41:43 Anita Bailey ([id number]) -------------------------------------------------------------------------------- Patient/Caregiver Education Details Patient Name: Anita Bailey. Date of Service: 02/15/2018 9:45 AM Medical Record Number: [id number] Patient Account Number: [id number] Date of Birth/Gender: 02-08-1934 (84 y.o. Female) Treating RN: Harold Barban Primary Care Physician: Emily Filbert Other Clinician: Referring Physician: Emily Filbert Treating Physician/Extender: Melburn Hake, HOYT Weeks in Treatment: 1 Education Assessment Education Provided To: Patient Education Topics Provided Wound/Skin Impairment: Handouts: Caring for Your Ulcer Methods: Demonstration, Explain/Verbal Responses: State content correctly Electronic Signature(s) Signed: 02/18/2018 11:43:02 AM By: Harold Barban Entered By: Harold Barban on 02/15/2018 10:00:28 Anita Bailey ([id number]) -------------------------------------------------------------------------------- Wound Assessment Details Patient Name: Anita Bailey, Anita Bailey. Date of Service: 02/15/2018 9:45 AM Medical Record Number: [id number] Patient Account Number: [id number] Date of Birth/Sex: April 09, 1934 (84 y.o. Female) Treating RN: Montey Hora Primary Care Kalesha Irving: Emily Filbert Other Clinician: Referring Angus Amini:  Emily Filbert Treating Graycie Halley/Extender: Melburn Hake, HOYT Weeks in Treatment: 1 Wound Status Wound Number: 1 Primary Etiology:  Trauma, Other Wound Location: Left Hand - Palm Wound Status: Open Wounding Event: Trauma Comorbid History: Arrhythmia, Deep Vein Thrombosis Date Acquired: 01/28/2018 Weeks Of Treatment: 1 Clustered Wound: No Photos Photo Uploaded By: Montey Hora on 02/15/2018 10:17:10 Wound Measurements Length: (cm) 2 Width: (cm) 0.2 Depth: (cm) 0.1 Area: (cm) 0.314 Volume: (cm) 0.031 % Reduction in Area: 0% % Reduction in Volume: 50.8% Epithelialization: None Tunneling: No Undermining: No Wound Description Full Thickness Without Exposed Support Classification: Structures Wound Margin: Flat and Intact Exudate Medium Amount: Exudate Type: Serous Exudate Color: amber Foul Odor After Cleansing: No Slough/Fibrino Yes Wound Bed Granulation Amount: Small (1-33%) Exposed Structure Granulation Quality: Red Fascia Exposed: No Necrotic Amount: Large (67-100%) Fat Layer (Subcutaneous Tissue) Exposed: Yes Necrotic Quality: Adherent Slough Tendon Exposed: No Muscle Exposed: No Joint Exposed: No Bone Exposed: No Anita Bailey, Anita Bailey ([id number]) Periwound Skin Texture Texture Color No Abnormalities Noted: No No Abnormalities Noted: No Callus: No Atrophie Blanche: No Crepitus: No Cyanosis: No Excoriation: No Ecchymosis: No Induration: No Erythema: Yes Rash: No Erythema Location: Circumferential Scarring: No Hemosiderin Staining: No Mottled: No Moisture Pallor: No No Abnormalities Noted: No Rubor: No Dry / Scaly: No Maceration: No Temperature / Pain Temperature: No Abnormality Tenderness on Palpation: Yes Wound Preparation Ulcer Cleansing: Rinsed/Irrigated with Saline Topical Anesthetic Applied: Other: lidocaine 4%, Treatment Notes Wound #1 (Left Hand - Palm) Notes Prisma Ag with hydrogel, conform to secure Electronic Signature(s) Signed: 02/15/2018 4:27:23 PM By: Montey Hora Entered By: Montey Hora on 02/15/2018 09:51:05 Anita Bailey  ([id number]) -------------------------------------------------------------------------------- [place] Details Patient Name: Anita Bailey. Date of Service: 02/15/2018 9:45 AM Medical Record Number: [id number] Patient Account Number: [id number] Date of Birth/Sex: Feb 27, 1934 (84 y.o. Female) Treating RN: Montey Hora Primary Care Kafi Dotter: Emily Filbert Other Clinician: Referring Wavie Hashimi: Emily Filbert Treating Letitia Sabala/Extender: Melburn Hake, HOYT Weeks in Treatment: 1 Vital Signs Time Taken: 09:44 Pulse (bpm): 48 Height (in): 65 Respiratory Rate (breaths/min): 16 Weight (lbs): 154 Blood Pressure (mmHg): 153/64 Body Mass Index (BMI): 25.6 Reference Range: 80 - 120 mg / dl Airway Electronic Signature(s) Signed: 02/15/2018 4:27:23 PM By: Montey Hora Entered By: Montey Hora on 02/15/2018 09:44:34

## 2018-02-19 NOTE — Progress Notes (Signed)
Anita Bailey, Anita Bailey ([id number]) Visit Report for 02/15/2018 Chief Complaint Document Details Patient Name: Anita Bailey, DON. Date of Service: 02/15/2018 9:45 AM Medical Record Number: [id number] Patient Account Number: [id number] Date of Birth/Sex: 05/24/1934 (84 y.o. Female) Treating RN: Harold Barban Primary Care Provider: Emily Filbert Other Clinician: Referring Provider: Emily Filbert Treating Provider/Extender: Melburn Hake, HOYT Weeks in Treatment: 1 Information Obtained from: Patient Chief Complaint Left hand traumatic ulcer Electronic Signature(s) Signed: 02/16/2018 8:18:44 AM By: Worthy Keeler PA-C Entered By: Worthy Keeler on 02/15/2018 09:58:30 Anita Bailey ([id number]) -------------------------------------------------------------------------------- Debridement Details Patient Name: Anita Bailey. Date of Service: 02/15/2018 9:45 AM Medical Record Number: [id number] Patient Account Number: [id number] Date of Birth/Sex: 11-Jan-1935 (84 y.o. Female) Treating RN: Harold Barban Primary Care Provider: Emily Filbert Other Clinician: Referring Provider: Emily Filbert Treating Provider/Extender: Melburn Hake, HOYT Weeks in Treatment: 1 Debridement Performed for Wound #1 Left Hand - Palm Assessment: Performed By: Physician STONE III, HOYT E., PA-C Debridement Type: Debridement Level of Consciousness (Pre- Awake and Alert procedure): Pre-procedure Verification/Time Yes - 10:00 Out Taken: Start Time: 10:00 Pain Control: Lidocaine Total Area Debrided (L x W): 2 (cm) x 0.2 (cm) = 0.4 (cm) Tissue and other material Non-Viable, Callus, Slough, Subcutaneous, Slough debrided: Level: Skin/Subcutaneous Tissue Debridement Description: Excisional Instrument: Curette Bleeding: Minimum Hemostasis Achieved: Pressure End Time: 10:06 Procedural Pain: 0 Post Procedural Pain: 0 Response to Treatment: Procedure was tolerated well Level of Consciousness Awake and Alert (Post-procedure): Post  Debridement Measurements of Total Wound Length: (cm) 2 Width: (cm) 0.2 Depth: (cm) 0.1 Volume: (cm) 0.031 Character of Wound/Ulcer Post Debridement: Improved Post Procedure Diagnosis Same as Pre-procedure Electronic Signature(s) Signed: 02/16/2018 8:18:44 AM By: Worthy Keeler PA-C Signed: 02/18/2018 11:43:02 AM By: Harold Barban Entered By: Harold Barban on 02/15/2018 10:06:26 Anita Bailey ([id number]) -------------------------------------------------------------------------------- HPI Details Patient Name: Anita Bailey, Anita Bailey. Date of Service: 02/15/2018 9:45 AM Medical Record Number: [id number] Patient Account Number: [id number] Date of Birth/Sex: [birth year] (84 y.o. Female) Treating RN: Harold Barban Primary Care Provider: Emily Filbert Other Clinician: Referring Provider: Emily Filbert Treating Provider/Extender: Melburn Hake, HOYT Weeks in Treatment: 1 History of Present Illness HPI Description: 02/08/18 on evaluation today patient presents for initial evaluation our clinic concerning issues that she has been having with her left hand. She had a fall where she subsequently fractured her nose as well is potentially fractured bone which was bruised. She is on 81 mg aspirin though she has not been taking it since the injury. She also [place] her [place] she has a skin tear. This was sutured but unfortunately she states that she really did not feel like it was an excellent job. She has been very frustrated with this. She really has no major medical problems otherwise. Overall she has been doing fairly well which is good news. The sutures are still in place and will need to be removed today. No fevers, chills, nausea, or vomiting noted at this time. 02/15/18 on evaluation today patient appears to be doing very well in regard to her hand ulcer. This shows signs of improvement which is excellent news. Overall I'm very pleased with the progress that has been made.  She has been tolerating the dressing changes without complication the only thing I see is that she does seem to be somewhat dry as far as the wound bed of concern she states she's been putting the Prisma and drying I think this may be part of the issue.  For that reason I do think that sticking with the collagen dressing but using hydrogel would be of benefit for her. Electronic Signature(s) Signed: 02/16/2018 8:18:44 AM By: Worthy Keeler PA-C Entered By: Worthy Keeler on 02/15/2018 10:10:11 Anita Bailey ([id number]) -------------------------------------------------------------------------------- Physical Exam Details Patient Name: Anita Bailey, Anita Bailey. Date of Service: 02/15/2018 9:45 AM Medical Record Number: [id number] Patient Account Number: [id number] Date of Birth/Sex: [birth year] (84 y.o. Female) Treating RN: Harold Barban Primary Care Provider: Emily Filbert Other Clinician: Referring Provider: Emily Filbert Treating Provider/Extender: STONE III, HOYT Weeks in Treatment: 1 Constitutional Well-nourished and well-hydrated in no acute distress. Respiratory normal breathing without difficulty. Psychiatric this patient is able to make decisions and demonstrates good insight into disease process. Alert and Oriented x 3. pleasant and cooperative. Notes Patient's wound bed currently did require some sharp debridement to clear away some of the bad tissue surrounding the wound as well is clearing away the dressing in the central portion of the wound. Post debridement the wound bed appears to be doing significantly better which is excellent news and the flap that was somewhat pullback has reattached. With that being said there's still has some healing to do although it does seem to be progressing quite nicely. Electronic Signature(s) Signed: 02/16/2018 8:18:44 AM By: Worthy Keeler PA-C Entered By: Worthy Keeler on 02/15/2018 10:11:04 Anita Bailey  ([id number]) -------------------------------------------------------------------------------- Physician Orders Details Patient Name: Anita Bailey, Anita Bailey. Date of Service: 02/15/2018 9:45 AM Medical Record Number: [id number] Patient Account Number: [id number] Date of Birth/Sex: 02-25-1934 (84 y.o. Female) Treating RN: Harold Barban Primary Care Provider: Emily Filbert Other Clinician: Referring Provider: Emily Filbert Treating Provider/Extender: Melburn Hake, HOYT Weeks in Treatment: 1 Verbal / Phone Orders: No Diagnosis Coding ICD-10 Coding Code Description [phone] Unspecified open wound of left hand, initial encounter Wound Cleansing Wound #1 Left Hand - Palm o Cleanse wound with mild soap and water Anesthetic (add to Medication List) Wound #1 Left Hand - Palm o Topical Lidocaine 4% cream applied to wound bed prior to debridement (In Clinic Only). Primary Wound Dressing Wound #1 Left Hand - Palm o Silver Collagen - Moisten with [place] Jelly Secondary Dressing Wound #1 Left Hand - Palm o Gauze and Kerlix/Conform - Secured with Coban Dressing Change Frequency Wound #1 Left Hand - Palm o Change dressing every other day. Follow-up Appointments Wound #1 Left Hand - Palm o Return Appointment in 1 week. Additional Orders / Instructions Wound #1 Left Hand - Palm o Activity as tolerated Electronic Signature(s) Signed: 02/16/2018 8:18:44 AM By: Worthy Keeler PA-C Signed: 02/18/2018 11:43:02 AM By: Harold Barban Entered By: Harold Barban on 02/15/2018 10:08:10 Anita Bailey ([id number]) -------------------------------------------------------------------------------- Problem List Details Patient Name: Anita Bailey, Anita Bailey. Date of Service: 02/15/2018 9:45 AM Medical Record Number: [id number] Patient Account Number: [id number] Date of Birth/Sex: Aug 05, 1934 (84 y.o. Female) Treating RN: Harold Barban Primary Care Provider: Emily Filbert Other Clinician: Referring Provider: Emily Filbert Treating Provider/Extender: Melburn Hake, HOYT Weeks in Treatment: 1 Active Problems ICD-10 Evaluated Encounter Code Description Active Date Today Diagnosis S61.402A Unspecified open wound of left hand, initial encounter 02/08/2018 No Yes Inactive Problems Resolved Problems Electronic Signature(s) Signed: 02/16/2018 8:18:44 AM By: Worthy Keeler PA-C Entered By: Worthy Keeler on 02/15/2018 09:58:20 Anita Bailey ([id number]) -------------------------------------------------------------------------------- Progress Note Details Patient Name: Anita Bailey, Anita Bailey. Date of Service: 02/15/2018 9:45 AM Medical Record Number: [id number] Patient Account Number: [id number] Date of Birth/Sex: 16-Dec-1934 (84 y.o. Female) Treating RN: Harold Barban Primary Care Provider:  Emily Filbert Other Clinician: Referring Provider: Emily Filbert Treating Provider/Extender: Melburn Hake, HOYT Weeks in Treatment: 1 Subjective Chief Complaint Information obtained from Patient Left hand traumatic ulcer History of Present Illness (HPI) 02/08/18 on evaluation today patient presents for initial evaluation our clinic concerning issues that she has been having with her left hand. She had a fall where she subsequently fractured her nose as well is potentially fractured bone which was bruised. She is on 81 mg aspirin though she has not been taking it since the injury. She also injured her left [place] where she has a skin tear. This was sutured but unfortunately she states that she really did not feel like it was an excellent job. She has been very frustrated with this. She really has no major medical problems otherwise. Overall she has been doing fairly well which is good news. The sutures are still in place and will need to be removed today. No fevers, chills, nausea, or vomiting noted at this time. 02/15/18 on evaluation today patient appears to be doing very well in regard to her hand ulcer. This  shows signs of improvement which is excellent news. Overall I'm very pleased with the progress that has been made. She has been tolerating the dressing changes without complication the only thing I see is that she does seem to be somewhat dry as far as the wound bed of concern she states she's been putting the Prisma and drying I think this may be part of the issue. For that reason I do think that sticking with the collagen dressing but using hydrogel would be of benefit for her. Patient History Information obtained from Patient. Family History No family history of Cancer, Diabetes, Heart Disease, Hereditary Spherocytosis, Hypertension, Kidney Disease, Lung Disease, Seizures, Stroke, Thyroid Problems, Tuberculosis. Social History Never smoker, Marital Status - Widowed, Alcohol Use - Rarely, Drug Use - No History, Caffeine Use - Daily. Medical History Eyes Denies history of Cataracts, Glaucoma, Optic Neuritis Ear/Nose/Mouth/Throat Denies history of Chronic sinus problems/congestion, Middle ear problems Hematologic/Lymphatic Denies history of Anemia, Hemophilia, Human Immunodeficiency Virus, Lymphedema, Sickle Cell Disease Respiratory Denies history of Aspiration, Asthma, Chronic Obstructive Pulmonary Disease (COPD), Pneumothorax, Sleep Apnea, Tuberculosis Cardiovascular Patient has history of Arrhythmia - PAT, Deep Vein Thrombosis Denies history of Angina, Congestive Heart Failure, Coronary Artery Disease, Hypertension, Hypotension, Myocardial Infarction, Peripheral Arterial Disease, Peripheral Venous Disease, Phlebitis, Vasculitis Gastrointestinal Denies history of Cirrhosis , Colitis, Crohn s, Hepatitis A, Hepatitis B, Hepatitis C Anita Bailey, Anita Bailey. ([id number]) Endocrine Denies history of Type I Diabetes, Type II Diabetes Genitourinary Denies history of End Stage Renal Disease Immunological Denies history of Lupus Erythematosus, Raynaud s, Scleroderma Integumentary (Skin) Denies  history of History of Burn, History of pressure wounds Musculoskeletal Denies history of Gout, Rheumatoid Arthritis, Osteoarthritis, Osteomyelitis Neurologic Denies history of Dementia, Neuropathy, Quadriplegia, Paraplegia, Seizure Disorder Oncologic Denies history of Received Chemotherapy, Received Radiation Psychiatric Denies history of Anorexia/bulimia, Confinement Anxiety Medical And Surgical History Notes Ear/Nose/Mouth/Throat broken nose Cardiovascular PE, HLD Musculoskeletal cervical disc disease Neurologic currently has a concussion Oncologic melamona Review of Systems (ROS) Constitutional Symptoms (General Health) Denies complaints or symptoms of Fever, Chills. Respiratory The patient has no complaints or symptoms. Cardiovascular The patient has no complaints or symptoms. Psychiatric The patient has no complaints or symptoms. Objective Constitutional Well-nourished and well-hydrated in no acute distress. Vitals Time Taken: 9:44 AM, Height: 65 in, Weight: 154 lbs, BMI: 25.6, Pulse: 48 bpm, Respiratory Rate: 16 breaths/min, Blood Pressure: 153/64  mmHg. Respiratory normal breathing without difficulty. Anita Bailey, Anita Bailey [place]. ([id number]) Psychiatric this patient is able to make decisions and demonstrates good insight into disease process. Alert and Oriented x 3. pleasant and cooperative. General Notes: Patient's wound bed currently did require some sharp debridement to clear away some of the bad tissue surrounding the wound as well is clearing away the dressing in the central portion of the wound. Post debridement the wound bed appears to be doing significantly better which is excellent news and the flap that was somewhat pullback has reattached. With that being said there's still has some healing to do although it does seem to be progressing quite nicely. Integumentary (Hair, Skin) Wound #1 status is Open. Original cause of wound was Trauma. The wound is located on the Left  Hand - Palm. The wound measures 2cm length x 0.2cm width x 0.1cm depth; 0.314cm^2 area and 0.031cm^3 volume. There is Fat Layer (Subcutaneous Tissue) Exposed exposed. There is no tunneling or undermining noted. There is a medium amount of serous drainage noted. The wound margin is flat and intact. There is small (1-33%) red granulation within the wound bed. There is a large (67-100%) amount of necrotic tissue within the wound bed including Adherent Slough. The periwound skin appearance exhibited: Erythema. The periwound skin appearance did not exhibit: Callus, Crepitus, Excoriation, Induration, Rash, Scarring, Dry/Scaly, Maceration, Atrophie Blanche, Cyanosis, Ecchymosis, Hemosiderin Staining, Mottled, Pallor, Rubor. The surrounding wound skin color is noted with erythema which is circumferential. Periwound temperature was noted as No Abnormality. The periwound has tenderness on palpation. Assessment Active Problems ICD-10 Unspecified open wound of left hand, initial encounter Procedures Wound #1 Pre-procedure diagnosis of Wound #1 is a Trauma, Other located on the Left Hand - Palm . There was a Excisional Skin/Subcutaneous Tissue Debridement with a total area of 0.4 sq cm performed by STONE III, HOYT E., PA-C. With the following instrument(s): Curette to remove Non-Viable tissue/material. Material removed includes Callus, Subcutaneous Tissue, and Slough after achieving pain control using Lidocaine. No specimens were taken. A time out was conducted at 10:00, prior to the start of the procedure. A Minimum amount of bleeding was controlled with Pressure. The procedure was tolerated well with a pain level of 0 throughout and a pain level of 0 following the procedure. Post Debridement Measurements: 2cm length x 0.2cm width x 0.1cm depth; 0.031cm^3 volume. Character of Wound/Ulcer Post Debridement is improved. Post procedure Diagnosis Wound #1: Same as Pre-Procedure Plan Wound Cleansing: Wound  #1 Left Hand - PalmKAYLIN, Anita Bailey. ([id number]) Cleanse wound with mild soap and water Anesthetic (add to Medication List): Wound #1 Left Hand - Palm: Topical Lidocaine 4% cream applied to wound bed prior to debridement (In Clinic Only). Primary Wound Dressing: Wound #1 Left Hand - Palm: Silver Collagen - Moisten with [place] Jelly Secondary Dressing: Wound #1 Left Hand - Palm: Gauze and Kerlix/Conform - Secured with Coban Dressing Change Frequency: Wound #1 Left Hand - Palm: Change dressing every other day. Follow-up Appointments: Wound #1 Left Hand - Palm: Return Appointment in 1 week. Additional Orders / Instructions: Wound #1 Left Hand - Palm: Activity as tolerated My suggestion currently is gonna be that we go ahead and continue with the above wound care measures for the next week. Patient is in agreement the plan. If anything changes or worsens she will let me know. We are gonna initiate the hydrogel along with the collagen in order to better manage this wound. Otherwise will see were things stand at follow-up. Please  see above for specific wound care orders. We will see patient for re-evaluation in 1 week(s) here in the clinic. If anything worsens or changes patient will contact our office for additional recommendations. Electronic Signature(s) Signed: 02/16/2018 8:18:44 AM By: Worthy Keeler PA-C Entered By: Worthy Keeler on 02/15/2018 10:11:41 Anita Bailey ([id number]) -------------------------------------------------------------------------------- ROS/PFSH Details Patient Name: Anita Bailey, WARR. Date of Service: 02/15/2018 9:45 AM Medical Record Number: [id number] Patient Account Number: [id number] Date of Birth/Sex: November 17, 1934 (84 y.o. Female) Treating RN: Harold Barban Primary Care Provider: Emily Filbert Other Clinician: Referring Provider: Emily Filbert Treating Provider/Extender: Melburn Hake, HOYT Weeks in Treatment: 1 Information Obtained From Patient Wound  History Do you currently have one or more open woundso Yes How many open wounds do you currently haveo 1 Approximately how long have you had your woundso 10 days How have you been treating your wound(s) until nowo xeroform gauze and bandaid Has your wound(s) ever healed and then re-openedo No Have you had any lab work done in the past montho Yes Who ordered the lab work doneo PCP Have you tested positive for an antibiotic resistant organism (MRSA, VRE)o No Have you tested positive for osteomyelitis (bone infection)o No Have you had any tests for circulation on your legso No Constitutional Symptoms (General Health) Complaints and Symptoms: Negative for: Fever; Chills Eyes Medical History: Negative for: Cataracts; Glaucoma; Optic Neuritis Ear/Nose/Mouth/Throat Medical History: Negative for: Chronic sinus problems/congestion; Middle ear problems Past Medical History Notes: broken nose Hematologic/Lymphatic Medical History: Negative for: Anemia; Hemophilia; Human Immunodeficiency Virus; Lymphedema; Sickle Cell Disease Respiratory Complaints and Symptoms: No Complaints or Symptoms Medical History: Negative for: Aspiration; Asthma; Chronic Obstructive Pulmonary Disease (COPD); Pneumothorax; Sleep Apnea; Tuberculosis Cardiovascular Complaints and Symptoms: No Complaints or Symptoms Anita Bailey, COTTA ([id number]) Medical History: Positive for: Arrhythmia - PAT; Deep Vein Thrombosis Negative for: Angina; Congestive Heart Failure; Coronary Artery Disease; Hypertension; Hypotension; Myocardial Infarction; Peripheral Arterial Disease; Peripheral Venous Disease; Phlebitis; Vasculitis Past Medical History Notes: PE, HLD Gastrointestinal Medical History: Negative for: Cirrhosis ; Colitis; Crohnos; Hepatitis A; Hepatitis B; Hepatitis C Endocrine Medical History: Negative for: Type I Diabetes; Type II Diabetes Genitourinary Medical History: Negative for: End Stage Renal  Disease Immunological Medical History: Negative for: Lupus Erythematosus; Raynaudos; Scleroderma Integumentary (Skin) Medical History: Negative for: History of Burn; History of pressure wounds Musculoskeletal Medical History: Negative for: Gout; Rheumatoid Arthritis; Osteoarthritis; Osteomyelitis Past Medical History Notes: cervical disc disease Neurologic Medical History: Negative for: Dementia; Neuropathy; Quadriplegia; Paraplegia; Seizure Disorder Past Medical History Notes: currently has a concussion Oncologic Medical History: Negative for: Received Chemotherapy; Received Radiation Past Medical History Notes: melamona Psychiatric Complaints and Symptoms: No Complaints or Symptoms Medical History: AMICA, HARRON ([id number]) Negative for: Anorexia/bulimia; Confinement Anxiety Immunizations Pneumococcal Vaccine: Received Pneumococcal Vaccination: Yes Immunization Notes: up to date Implantable Devices Family and Social History Cancer: No; Diabetes: No; Heart Disease: No; Hereditary Spherocytosis: No; Hypertension: No; Kidney Disease: No; Lung Disease: No; Seizures: No; Stroke: No; Thyroid Problems: No; Tuberculosis: No; Never smoker; Marital Status - Widowed; Alcohol Use: Rarely; Drug Use: No History; Caffeine Use: Daily; Financial Concerns: No; Food, Clothing or Shelter Needs: No; Support System Lacking: No; Transportation Concerns: No; Advanced Directives: No; Patient does not want information on Advanced Directives Physician Affirmation I have reviewed and agree with the above information. Electronic Signature(s) Signed: 02/16/2018 8:18:44 AM By: Worthy Keeler PA-C Signed: 02/18/2018 11:43:02 AM By: Harold Barban Entered By: Worthy Keeler on 02/15/2018 10:10:52 Anita Bailey ([id number]) -------------------------------------------------------------------------------- SuperBill Details  Patient Name: HIBO, BLASDELL. Date of Service: 02/15/2018 Medical Record  Number: [id number] Patient Account Number: [id number] Date of Birth/Sex: 1934/02/15 (84 y.o. Female) Treating RN: Harold Barban Primary Care Provider: Emily Filbert Other Clinician: Referring Provider: Emily Filbert Treating Provider/Extender: Melburn Hake, HOYT Weeks in Treatment: 1 Diagnosis Coding ICD-10 Codes Code Description [phone] Unspecified open wound of left hand, initial encounter Facility Procedures CPT4 Code: [id number] Description: 67519 - DEB SUBQ TISSUE 20 SQ CM/< ICD-10 Diagnosis Description S61.402A Unspecified open wound of left hand, initial encounter Modifier: Quantity: 1 Physician Procedures CPT4 Code: [id number] Description: 06999 - WC PHYS SUBQ TISS 20 SQ CM ICD-10 Diagnosis Description M72.277T Unspecified open wound of left hand, initial encounter Modifier: Quantity: 1 Electronic Signature(s) Signed: 02/16/2018 8:18:44 AM By: Worthy Keeler PA-C Entered By: Worthy Keeler on 02/15/2018 10:11:48

## 2018-02-22 ENCOUNTER — Encounter: Payer: Medicare Other | Admitting: Physician Assistant

## 2018-02-22 DIAGNOSIS — S61402A Unspecified open wound of left hand, initial encounter: Secondary | ICD-10-CM | POA: Diagnosis not present

## 2018-02-24 NOTE — Progress Notes (Signed)
Anita Bailey ([id number]) Visit Report for 02/22/2018 Arrival Information Details Patient Name: Anita Bailey, Anita Bailey. Date of Service: 02/22/2018 9:45 AM Medical Record Number: [id number] Patient Account Number: [id number] Date of Birth/Sex: 1934-04-16 (84 y.o. F) Treating RN: Cornell Barman Primary Care Amadeus Oyama: Emily Filbert Other Clinician: Referring Christi Wirick: Emily Filbert Treating Martavia Tye/Extender: Melburn Hake, HOYT Weeks in Treatment: 2 Visit Information History Since Last Visit Added or deleted any medications: No Patient Arrived: Crutches Any new allergies or adverse reactions: No Arrival Time: 09:48 Had a fall or experienced change in No Accompanied By: self activities of daily living that may affect Transfer Assistance: None risk of falls: Patient Identification Verified: Yes Signs or symptoms of abuse/neglect since last visito No Secondary Verification Process Completed: Yes Hospitalized since last visit: No Implantable device outside of the clinic excluding No cellular tissue based products placed in the center since last visit: Has Dressing in Place as Prescribed: Yes Pain Present Now: No Electronic Signature(s) Signed: 02/22/2018 4:24:19 PM By: Lorine Bears RCP, RRT, CHT Entered By: Lorine Bears on 02/22/2018 09:48:48 [name] ([id number]) -------------------------------------------------------------------------------- Clinic Level of Care Assessment Details Patient Name: Anita Bailey. Date of Service: 02/22/2018 9:45 AM Medical Record Number: [id number] Patient Account Number: [id number] Date of Birth/Sex: [birth year] (84 y.o. F) Treating RN: Cornell Barman Primary Care Niurka Benecke: Emily Filbert Other Clinician: Referring Kynadee Dam: Emily Filbert Treating Nyomie Ehrlich/Extender: Melburn Hake, HOYT Weeks in Treatment: 2 Clinic Level of Care Assessment Items TOOL 4 Quantity Score []  - Use when only an EandM is performed on FOLLOW-UP visit  0 ASSESSMENTS - Nursing Assessment / Reassessment []  - Reassessment of Co-morbidities (includes updates in patient status) 0 X- 1 5 Reassessment of Adherence to Treatment Plan ASSESSMENTS - Wound and Skin Assessment / Reassessment X - Simple Wound Assessment / Reassessment - one wound 1 5 []  - 0 Complex Wound Assessment / Reassessment - multiple wounds []  - 0 Dermatologic / Skin Assessment (not related to wound area) ASSESSMENTS - Focused Assessment []  - Circumferential Edema Measurements - multi extremities 0 []  - 0 Nutritional Assessment / Counseling / Intervention []  - 0 Lower Extremity Assessment (monofilament, tuning fork, pulses) []  - 0 Peripheral Arterial Disease Assessment (using hand held doppler) ASSESSMENTS - Ostomy and/or Continence Assessment and Care []  - Incontinence Assessment and Management 0 []  - 0 Ostomy Care Assessment and Management (repouching, etc.) PROCESS - Coordination of Care X - Simple Patient / Family Education for ongoing care 1 15 []  - 0 Complex (extensive) Patient / Family Education for ongoing care []  - 0 Staff obtains Programmer, systems, Records, Test Results / Process Orders []  - 0 Staff telephones HHA, Nursing Homes / Clarify orders / etc []  - 0 Routine Transfer to another Facility (non-emergent condition) []  - 0 Routine Hospital Admission (non-emergent condition) []  - 0 New Admissions / Biomedical engineer / Ordering NPWT, Apligraf, etc. []  - 0 Emergency Hospital Admission (emergent condition) X- 1 10 Simple Discharge Coordination MONACA, WADAS. ([id number]) []  - 0 Complex (extensive) Discharge Coordination PROCESS - Special Needs []  - Pediatric / Minor Patient Management 0 []  - 0 Isolation Patient Management []  - 0 Hearing / Language / Visual special needs []  - 0 Assessment of Community assistance (transportation, D/C planning, etc.) []  - 0 Additional assistance / Altered mentation []  - 0 Support Surface(s) Assessment (bed,  cushion, seat, etc.) INTERVENTIONS - Wound Cleansing / Measurement X - Simple Wound Cleansing - one wound 1 5 []  - 0 Complex Wound Cleansing - multiple wounds X-  1 5 Wound Imaging (photographs - any number of wounds) []  - 0 Wound Tracing (instead of photographs) X- 1 5 Simple Wound Measurement - one wound []  - 0 Complex Wound Measurement - multiple wounds INTERVENTIONS - Wound Dressings X - Small Wound Dressing one or multiple wounds 1 10 []  - 0 Medium Wound Dressing one or multiple wounds []  - 0 Large Wound Dressing one or multiple wounds []  - 0 Application of Medications - topical []  - 0 Application of Medications - injection INTERVENTIONS - Miscellaneous []  - External ear exam 0 []  - 0 Specimen Collection (cultures, biopsies, blood, body fluids, etc.) []  - 0 Specimen(s) / Culture(s) sent or taken to Lab for analysis []  - 0 Patient Transfer (multiple staff / Civil Service fast streamer / Similar devices) []  - 0 Simple Staple / Suture removal (25 or less) []  - 0 Complex Staple / Suture removal (26 or more) []  - 0 Hypo / Hyperglycemic Management (close monitor of Blood Glucose) []  - 0 Ankle / Brachial Index (ABI) - do not check if billed separately X- 1 5 Vital Signs ROBECCA, FULGHAM. ([id number]) Has the patient been seen at the hospital within the last three years: Yes Total Score: 65 Level Of Care: New/Established - Level 2 Electronic Signature(s) Signed: 02/23/2018 11:53:49 AM By: Gretta Cool, BSN, RN, CWS, Kim RN, BSN Entered By: Gretta Cool, BSN, RN, CWS, Kim on 02/22/2018 10:30:42 [name] ([id number]) -------------------------------------------------------------------------------- Encounter Discharge Information Details Patient Name: Anita Bailey. Date of Service: 02/22/2018 9:45 AM Medical Record Number: [id number] Patient Account Number: [id number] Date of Birth/Sex: 08/26/1934 (84 y.o. F) Treating RN: Cornell Barman Primary Care Asher Babilonia: Emily Filbert Other Clinician: Referring  Alexzandria Massman: Emily Filbert Treating Ellie Spickler/Extender: Melburn Hake, HOYT Weeks in Treatment: 2 Encounter Discharge Information Items Discharge Condition: Stable Ambulatory Status: Ambulatory Discharge Destination: Home Transportation: Private Auto Accompanied By: self Schedule Follow-up Appointment: Yes Clinical Summary of Care: Electronic Signature(s) Signed: 02/23/2018 11:53:49 AM By: Gretta Cool, BSN, RN, CWS, Kim RN, BSN Entered By: Gretta Cool, BSN, RN, CWS, Kim on 02/22/2018 10:32:09 [name] ([id number]) -------------------------------------------------------------------------------- Lower Extremity Assessment Details Patient Name: GARNETTE, GREB. Date of Service: 02/22/2018 9:45 AM Medical Record Number: [id number] Patient Account Number: [id number] Date of Birth/Sex: 01-18-1934 (84 y.o. F) Treating RN: Secundino Ginger Primary Care Natsuko Kelsay: Emily Filbert Other Clinician: Referring Avyon Herendeen: Emily Filbert Treating Alexie Samson/Extender: Melburn Hake, HOYT Weeks in Treatment: 2 Electronic Signature(s) Signed: 02/22/2018 11:33:08 AM By: Secundino Ginger Entered By: Secundino Ginger on 02/22/2018 10:03:59 [name] ([id number]) -------------------------------------------------------------------------------- Multi Wound Chart Details Patient Name: AIZA, VOLLRATH. Date of Service: 02/22/2018 9:45 AM Medical Record Number: [id number] Patient Account Number: [id number] Date of Birth/Sex: 1934/10/09 (84 y.o. F) Treating RN: Cornell Barman Primary Care Lissandro Dilorenzo: Emily Filbert Other Clinician: Referring Edin Kon: Emily Filbert Treating Aarilyn Dye/Extender: Melburn Hake, HOYT Weeks in Treatment: 2 Vital Signs Height(in): 65 Pulse(bpm): 43 Weight(lbs): 154 Blood Pressure(mmHg): 125/49 Body Mass Index(BMI): 26 Temperature(F): 97.6 Respiratory Rate 16 (breaths/min): Photos: [1:No Photos] [N/A:N/A] Wound Location: [1:Left Hand - Palm] [N/A:N/A] Wounding Event: [1:Trauma] [N/A:N/A] Primary Etiology: [1:Trauma, Other]  [N/A:N/A] Comorbid History: [1:Arrhythmia, Deep Vein Thrombosis] [N/A:N/A] Date Acquired: [1:01/28/2018] [N/A:N/A] Weeks of Treatment: [1:2] [N/A:N/A] Wound Status: [1:Open] [N/A:N/A] Measurements L x W x D [1:0.1x0.1x0.1] [N/A:N/A] (cm) Area (cm) : [1:0.008] [N/A:N/A] Volume (cm) : [1:0.001] [N/A:N/A] % Reduction in Area: [1:97.50%] [N/A:N/A] % Reduction in Volume: [1:98.40%] [N/A:N/A] Classification: [1:Full Thickness Without Exposed Support Structures] [N/A:N/A] Exudate Amount: [1:None Present] [N/A:N/A] Wound Margin: [1:Flat and Intact] [N/A:N/A] Granulation Amount: [1:None Present (  0%)] [N/A:N/A] Necrotic Amount: [1:Small (1-33%)] [N/A:N/A] Necrotic Tissue: [1:Eschar] [N/A:N/A] Exposed Structures: [1:Fat Layer (Subcutaneous Tissue) Exposed: Yes Fascia: No Tendon: No Muscle: No Joint: No Bone: No] [N/A:N/A] Epithelialization: [1:None] [N/A:N/A] Periwound Skin Texture: [1:Excoriation: No Induration: No Callus: No Crepitus: No Rash: No Scarring: No] [N/A:N/A] Periwound Skin Moisture: [N/A:N/A] Maceration: No Dry/Scaly: No Periwound Skin Color: Erythema: Yes N/A N/A Atrophie Blanche: No Cyanosis: No Ecchymosis: No Hemosiderin Staining: No Mottled: No Pallor: No Rubor: No Erythema Location: Circumferential N/A N/A Temperature: No Abnormality N/A N/A Tenderness on Palpation: Yes N/A N/A Wound Preparation: Ulcer Cleansing: N/A N/A Rinsed/Irrigated with Saline Topical Anesthetic Applied: Other: lidocaine 4% Treatment Notes Electronic Signature(s) Signed: 02/23/2018 11:53:49 AM By: Gretta Cool, BSN, RN, CWS, Kim RN, BSN Entered By: Gretta Cool, BSN, RN, CWS, Kim on 02/22/2018 10:29:10 [name] ([id number]) -------------------------------------------------------------------------------- [place] Details Patient Name: MIRELY, PANGLE. Date of Service: 02/22/2018 9:45 AM Medical Record Number: [id number] Patient Account Number: [id number] Date of Birth/Sex:  03-14-1934 (84 y.o. F) Treating RN: Cornell Barman Primary Care Yidel Teuscher: Emily Filbert Other Clinician: Referring Esparanza Krider: Emily Filbert Treating Berenice Oehlert/Extender: Melburn Hake, HOYT Weeks in Treatment: 2 Active Inactive Electronic Signature(s) Signed: 02/23/2018 8:14:42 AM By: Gretta Cool, BSN, RN, CWS, Kim RN, BSN Entered By: Gretta Cool, BSN, RN, CWS, Kim on 02/23/2018 08:14:42 [name] ([id number]) -------------------------------------------------------------------------------- Pain Assessment Details Patient Name: LOYD, SALVADOR. Date of Service: 02/22/2018 9:45 AM Medical Record Number: [id number] Patient Account Number: [id number] Date of Birth/Sex: Jun 27, 1934 (84 y.o. F) Treating RN: Cornell Barman Primary Care Ayonna Speranza: Emily Filbert Other Clinician: Referring Ceaira Ernster: Emily Filbert Treating Yuriel Lopezmartinez/Extender: Melburn Hake, HOYT Weeks in Treatment: 2 Active Problems Location of Pain Severity and Description of Pain Patient Has Paino No Site Locations Pain Management and Medication Current Pain Management: Electronic Signature(s) Signed: 02/22/2018 4:24:19 PM By: Lorine Bears RCP, RRT, CHT Signed: 02/23/2018 11:53:49 AM By: Gretta Cool, BSN, RN, CWS, Kim RN, BSN Entered By: Lorine Bears on 02/22/2018 09:48:55 [name] ([id number]) -------------------------------------------------------------------------------- Patient/Caregiver Education Details Patient Name: BILLIEJEAN, SCHIMEK. Date of Service: 02/22/2018 9:45 AM Medical Record Number: [id number] Patient Account Number: [id number] Date of Birth/Gender: 1934-03-06 (84 y.o. F) Treating RN: Cornell Barman Primary Care Physician: Emily Filbert Other Clinician: Referring Physician: Emily Filbert Treating Physician/Extender: Melburn Hake, HOYT Weeks in Treatment: 2 Education Assessment Education Provided To: Patient Education Topics Provided Wound/Skin Impairment: Handouts: Caring for Your Ulcer Methods: Demonstration,  Explain/Verbal Responses: State content correctly Electronic Signature(s) Signed: 02/23/2018 11:53:49 AM By: Gretta Cool, BSN, RN, CWS, Kim RN, BSN Entered By: Gretta Cool, BSN, RN, CWS, Kim on 02/22/2018 10:31:49 [name] ([id number]) -------------------------------------------------------------------------------- Wound Assessment Details Patient Name: FAITH, PATRICELLI. Date of Service: 02/22/2018 9:45 AM Medical Record Number: [id number] Patient Account Number: [id number] Date of Birth/Sex: 10/26/1934 (84 y.o. F) Treating RN: Cornell Barman Primary Care Anneta Rounds: Emily Filbert Other Clinician: Referring Ariv Penrod: Emily Filbert Treating Travious Vanover/Extender: Melburn Hake, HOYT Weeks in Treatment: 2 Wound Status Wound Number: 1 Primary Etiology: Trauma, Other Wound Location: Left Hand - Palm Wound Status: Healed - Epithelialized Wounding Event: Trauma Comorbid History: Arrhythmia, Deep Vein Thrombosis Date Acquired: 01/28/2018 Weeks Of Treatment: 2 Clustered Wound: No Photos Photo Uploaded By: Secundino Ginger on 02/22/2018 11:32:01 Wound Measurements Length: (cm) 0 % Reduc Width: (cm) 0 % Reduc Depth: (cm) 0 Epithel Area: (cm) 0 Volume: (cm) 0 tion in Area: 100% tion in Volume: 100% ialization: None Wound Description Full Thickness Without Exposed Support Classification: Structures Wound Margin: Flat and Intact Exudate None Present Amount: Foul  Odor After Cleansing: No Slough/Fibrino Yes Wound Bed Granulation Amount: None Present (0%) Exposed Structure Necrotic Amount: Small (1-33%) Fascia Exposed: No Necrotic Quality: Eschar Fat Layer (Subcutaneous Tissue) Exposed: Yes Tendon Exposed: No Muscle Exposed: No Joint Exposed: No Bone Exposed: No Periwound Skin Texture Texture Color BRITTLYN, CLOE ([id number]) No Abnormalities Noted: No No Abnormalities Noted: No Callus: No Atrophie Blanche: No Crepitus: No Cyanosis: No Excoriation: No Ecchymosis: No Induration: No Erythema:  Yes Rash: No Erythema Location: Circumferential Scarring: No Hemosiderin Staining: No Mottled: No Moisture Pallor: No No Abnormalities Noted: No Rubor: No Dry / Scaly: No Maceration: No Temperature / Pain Temperature: No Abnormality Tenderness on Palpation: Yes Wound Preparation Ulcer Cleansing: Rinsed/Irrigated with Saline Topical Anesthetic Applied: Other: lidocaine 4%, Electronic Signature(s) Signed: 02/23/2018 11:53:49 AM By: Gretta Cool, BSN, RN, CWS, Kim RN, BSN Entered By: Gretta Cool, BSN, RN, CWS, Kim on 02/22/2018 10:30:56 [name] ([id number]) -------------------------------------------------------------------------------- [place] Details Patient Name: ARNEDA, SAPPINGTON. Date of Service: 02/22/2018 9:45 AM Medical Record Number: [id number] Patient Account Number: [id number] Date of Birth/Sex: 1934-11-02 (84 y.o. F) Treating RN: Cornell Barman Primary Care Jabre Heo: Emily Filbert Other Clinician: Referring Johnasia Liese: Emily Filbert Treating Lorel Lembo/Extender: Melburn Hake, HOYT Weeks in Treatment: 2 Vital Signs Time Taken: 09:48 Temperature (F): 97.6 Height (in): 65 Pulse (bpm): 43 Weight (lbs): 154 Respiratory Rate (breaths/min): 16 Body Mass Index (BMI): 25.6 Blood Pressure (mmHg): 125/49 Reference Range: 80 - 120 mg / dl Airway Electronic Signature(s) Signed: 02/22/2018 4:24:19 PM By: Lorine Bears RCP, RRT, CHT Entered By: Lorine Bears on 02/22/2018 09:51:18

## 2018-02-24 NOTE — Progress Notes (Signed)
Anita Bailey, Anita Bailey ([id number]) Visit Report for 02/22/2018 Chief Complaint Document Details Patient Name: Anita Bailey, Anita Bailey. Date of Service: 02/22/2018 9:45 AM Medical Record Number: [id number] Patient Account Number: [id number] Date of Birth/Sex: 13-Aug-1934 (84 y.o. F) Treating RN: Cornell Barman Primary Care Provider: Emily Filbert Other Clinician: Referring Provider: Emily Filbert Treating Provider/Extender: Melburn Hake, Surah Pelley Weeks in Treatment: 2 Information Obtained from: Patient Chief Complaint Left hand traumatic ulcer Electronic Signature(s) Signed: 02/23/2018 5:08:21 PM By: Worthy Keeler PA-C Entered By: Worthy Keeler on 02/22/2018 09:54:52 Anita Bailey ([id number]) -------------------------------------------------------------------------------- HPI Details Patient Name: Anita Bailey. Date of Service: 02/22/2018 9:45 AM Medical Record Number: [id number] Patient Account Number: [id number] Date of Birth/Sex: 16-Feb-1934 (84 y.o. F) Treating RN: Cornell Barman Primary Care Provider: Emily Filbert Other Clinician: Referring Provider: Emily Filbert Treating Provider/Extender: Melburn Hake, Elysse Polidore Weeks in Treatment: 2 History of Present Illness HPI Description: 02/08/18 on evaluation today patient presents for initial evaluation our clinic concerning issues that she has been having with her left hand. She had a fall where she subsequently fractured her nose as well is potentially fractured bone which was bruised. She is on 81 mg aspirin though she has not been taking it since the injury. She also injured her left [place] where she has a skin tear. This was sutured but unfortunately she states that she really did not feel like it was an excellent job. She has been very frustrated with this. She really has no major medical problems otherwise. Overall she has been doing fairly well which is good news. The sutures are still in place and will need to be removed today. No fevers,  chills, nausea, or vomiting noted at this time. 02/15/18 on evaluation today patient appears to be doing very well in regard to her hand ulcer. This shows signs of improvement which is excellent news. Overall I'm very pleased with the progress that has been made. She has been tolerating the dressing changes without complication the only thing I see is that she does seem to be somewhat dry as far as the wound bed of concern she states she's been putting the Prisma and drying I think this may be part of the issue. For that reason I do think that sticking with the collagen dressing but using hydrogel would be of benefit for her. 02/22/18 on evaluation today patient appears to be doing excellent in regard to her hand also. In fact this appears to be completely healed which is excellent news. She's been tolerating the dressing changes without complication. Electronic Signature(s) Signed: 02/23/2018 5:08:21 PM By: Worthy Keeler PA-C Entered By: Worthy Keeler on 02/22/2018 14:30:13 Anita Bailey ([id number]) -------------------------------------------------------------------------------- Physical Exam Details Patient Name: Anita Bailey, Anita Bailey. Date of Service: 02/22/2018 9:45 AM Medical Record Number: [id number] Patient Account Number: [id number] Date of Birth/Sex: 1934/08/16 (84 y.o. F) Treating RN: Cornell Barman Primary Care Provider: Emily Filbert Other Clinician: Referring Provider: Emily Filbert Treating Provider/Extender: Melburn Hake, Cobain Morici Weeks in Treatment: 2 Constitutional Well-nourished and well-hydrated in no acute distress. Respiratory normal breathing without difficulty. Psychiatric this patient is able to make decisions and demonstrates good insight into disease process. Alert and Oriented x 3. pleasant and cooperative. Notes Patient's wound bed currently shows signs of complete epithelialization which is excellent news. There's no evidence of infection at this time which is also excellent  news. Overall very pleased in this regard. Electronic Signature(s) Signed: 02/23/2018 5:08:21 PM By: Worthy Keeler PA-C Entered  By: Worthy Keeler on 02/22/2018 14:45:45 Anita Bailey ([id number]) -------------------------------------------------------------------------------- Physician Orders Details Patient Name: Anita Bailey, Anita Bailey. Date of Service: 02/22/2018 9:45 AM Medical Record Number: [id number] Patient Account Number: [id number] Date of Birth/Sex: [birth year] (84 y.o. F) Treating RN: Cornell Barman Primary Care Provider: Emily Filbert Other Clinician: Referring Provider: Emily Filbert Treating Provider/Extender: Melburn Hake, Shanekqua Schaper Weeks in Treatment: 2 Verbal / Phone Orders: No Diagnosis Coding ICD-10 Coding Code Description [phone] Unspecified open wound of left hand, initial encounter Skin Barriers/Peri-Wound Care Wound #1 Left Hand - Palm o Moisturizing lotion Discharge From [facility] Services Wound #1 Left Hand - Palm o Discharge from [place] complete. Electronic Signature(s) Signed: 02/23/2018 11:53:49 AM By: Gretta Cool, BSN, RN, CWS, Kim RN, BSN Signed: 02/23/2018 5:08:21 PM By: Worthy Keeler PA-C Entered By: Gretta Cool BSN, RN, CWS, Kim on 02/22/2018 10:30:14 Anita Bailey, Anita Bailey ([id number]) -------------------------------------------------------------------------------- Problem List Details Patient Name: Anita Bailey, Anita Bailey. Date of Service: 02/22/2018 9:45 AM Medical Record Number: [id number] Patient Account Number: [id number] Date of Birth/Sex: 05-Oct-1934 (84 y.o. F) Treating RN: Cornell Barman Primary Care Provider: Emily Filbert Other Clinician: Referring Provider: Emily Filbert Treating Provider/Extender: Melburn Hake, Akshitha Culmer Weeks in Treatment: 2 Active Problems ICD-10 Evaluated Encounter Code Description Active Date Today Diagnosis S61.402A Unspecified open wound of left hand, initial encounter 02/08/2018 No Yes Inactive Problems Resolved Problems Electronic  Signature(s) Signed: 02/23/2018 5:08:21 PM By: Worthy Keeler PA-C Entered By: Worthy Keeler on 02/22/2018 09:54:44 Anita Bailey ([id number]) -------------------------------------------------------------------------------- Progress Note Details Patient Name: Anita Bailey. Date of Service: 02/22/2018 9:45 AM Medical Record Number: [id number] Patient Account Number: [id number] Date of Birth/Sex: September 04, 1934 (84 y.o. F) Treating RN: Cornell Barman Primary Care Provider: Emily Filbert Other Clinician: Referring Provider: Emily Filbert Treating Provider/Extender: Melburn Hake, Marcellis Frampton Weeks in Treatment: 2 Subjective Chief Complaint Information obtained from Patient Left hand traumatic ulcer History of Present Illness (HPI) 02/08/18 on evaluation today patient presents for initial evaluation our clinic concerning issues that she has been having with her left hand. She had a fall where she subsequently fractured her nose as well is potentially fractured bone which was bruised. She is on 81 mg aspirin though she has not been taking it since the injury. She also injured her left palm [place] the [place] where she has a skin tear. This was sutured but unfortunately she states that she really did not feel like it was an excellent job. She has been very frustrated with this. She really has no major medical problems otherwise. Overall she has been doing fairly well which is good news. The sutures are still in place and will need to be removed today. No fevers, chills, nausea, or vomiting noted at this time. 02/15/18 on evaluation today patient appears to be doing very well in regard to her hand ulcer. This shows signs of improvement which is excellent news. Overall I'm very pleased with the progress that has been made. She has been tolerating the dressing changes without complication the only thing I see is that she does seem to be somewhat dry as far as the wound bed of concern she states she's been  putting the Prisma and drying I think this may be part of the issue. For that reason I do think that sticking with the collagen dressing but using hydrogel would be of benefit for her. 02/22/18 on evaluation today patient appears to be doing excellent in regard to her hand also. In fact this appears to  be completely healed which is excellent news. She's been tolerating the dressing changes without complication. Patient History Information obtained from Patient. Family History No family history of Cancer, Diabetes, Heart Disease, Hereditary Spherocytosis, Hypertension, Kidney Disease, Lung Disease, Seizures, Stroke, Thyroid Problems, Tuberculosis. Social History Never smoker, Marital Status - Widowed, Alcohol Use - Rarely, Drug Use - No History, Caffeine Use - Daily. Medical History Eyes Denies history of Cataracts, Glaucoma, Optic Neuritis Ear/Nose/Mouth/Throat Denies history of Chronic sinus problems/congestion, Middle ear problems Hematologic/Lymphatic Denies history of Anemia, Hemophilia, Human Immunodeficiency Virus, Lymphedema, Sickle Cell Disease Respiratory Denies history of Aspiration, Asthma, Chronic Obstructive Pulmonary Disease (COPD), Pneumothorax, Sleep Apnea, Tuberculosis Cardiovascular Patient has history of Arrhythmia - PAT, Deep Vein Thrombosis Denies history of Angina, Congestive Heart Failure, Coronary Artery Disease, Hypertension, Hypotension, Myocardial Anita Bailey, Anita Bailey. ([id number]) Infarction, Peripheral Arterial Disease, Peripheral Venous Disease, Phlebitis, Vasculitis Gastrointestinal Denies history of Cirrhosis , Colitis, Crohn s, Hepatitis A, Hepatitis B, Hepatitis C Endocrine Denies history of Type I Diabetes, Type II Diabetes Genitourinary Denies history of End Stage Renal Disease Immunological Denies history of Lupus Erythematosus, Raynaud s, Scleroderma Integumentary (Skin) Denies history of History of Burn, History of pressure  wounds Musculoskeletal Denies history of Gout, Rheumatoid Arthritis, Osteoarthritis, Osteomyelitis Neurologic Denies history of Dementia, Neuropathy, Quadriplegia, Paraplegia, Seizure Disorder Oncologic Denies history of Received Chemotherapy, Received Radiation Psychiatric Denies history of Anorexia/bulimia, Confinement Anxiety Medical And Surgical History Notes Ear/Nose/Mouth/Throat broken nose Cardiovascular PE, HLD Musculoskeletal cervical disc disease Neurologic currently has a concussion Oncologic melamona Review of Systems (ROS) Constitutional Symptoms (General Health) Denies complaints or symptoms of Fever, Chills. Respiratory The patient has no complaints or symptoms. Cardiovascular The patient has no complaints or symptoms. Psychiatric The patient has no complaints or symptoms. Objective Constitutional Well-nourished and well-hydrated in no acute distress. Vitals Time Taken: 9:48 AM, Height: 65 in, Weight: 154 lbs, BMI: 25.6, Temperature: 97.6 F, Pulse: 43 bpm, Respiratory Rate: 16 breaths/min, Blood Pressure: 125/49 mmHg. Anita Bailey, Anita Bailey. ([id number]) Respiratory normal breathing without difficulty. Psychiatric this patient is able to make decisions and demonstrates good insight into disease process. Alert and Oriented x 3. pleasant and cooperative. General Notes: Patient's wound bed currently shows signs of complete epithelialization which is excellent news. There's no evidence of infection at this time which is also excellent news. Overall very pleased in this regard. Integumentary (Hair, Skin) Wound #1 status is Healed - Epithelialized. Original cause of wound was Trauma. The wound is located on the Left Hand - Palm. The wound measures 0cm length x 0cm width x 0cm depth; 0cm^2 area and 0cm^3 volume. There is Fat Layer (Subcutaneous Tissue) Exposed exposed. There is a none present amount of drainage noted. The wound margin is flat and intact. There is no  granulation within the wound bed. There is a small (1-33%) amount of necrotic tissue within the wound bed including Eschar. The periwound skin appearance exhibited: Erythema. The periwound skin appearance did not exhibit: Callus, Crepitus, Excoriation, Induration, Rash, Scarring, Dry/Scaly, Maceration, Atrophie Blanche, Cyanosis, Ecchymosis, Hemosiderin Staining, Mottled, Pallor, Rubor. The surrounding wound skin color is noted with erythema which is circumferential. Periwound temperature was noted as No Abnormality. The periwound has tenderness on palpation. Assessment Active Problems ICD-10 Unspecified open wound of left hand, initial encounter Plan Skin Barriers/Peri-Wound Care: Wound #1 Left Hand - Palm: Moisturizing lotion Discharge From [facility] Services: Wound #1 Left Hand - Palm: Discharge from [place] - Treatment complete. At this point the patient is gonna be discharged from wound care  services as she appears to be doing excellent. Anything in the future changes or worsens she can let me know otherwise my hope is that she will continue to show signs of good improvement week by week. Electronic Signature(s) Signed: 02/23/2018 5:08:21 PM By: Worthy Keeler PA-C Entered By: Worthy Keeler on 02/22/2018 14:46:26 Anita Bailey, Anita Bailey ([id number]) Anita Bailey, Anita Bailey ([id number]) -------------------------------------------------------------------------------- ROS/PFSH Details Patient Name: Anita Bailey, Anita Bailey. Date of Service: 02/22/2018 9:45 AM Medical Record Number: [id number] Patient Account Number: [id number] Date of Birth/Sex: [birth year] (84 y.o. F) Treating RN: Cornell Barman Primary Care Provider: Emily Filbert Other Clinician: Referring Provider: Emily Filbert Treating Provider/Extender: Melburn Hake, Madalyn Legner Weeks in Treatment: 2 Information Obtained From Patient Wound History Do you currently have one or more open woundso Yes How many open wounds do you currently haveo 1 Approximately  how long have you had your woundso 10 days How have you been treating your wound(s) until nowo xeroform gauze and bandaid Has your wound(s) ever healed and then re-openedo No Have you had any lab work done in the past montho Yes Who ordered the lab work doneo PCP Have you tested positive for an antibiotic resistant organism (MRSA, VRE)o No Have you tested positive for osteomyelitis (bone infection)o No Have you had any tests for circulation on your legso No Constitutional Symptoms (General Health) Complaints and Symptoms: Negative for: Fever; Chills Eyes Medical History: Negative for: Cataracts; Glaucoma; Optic Neuritis Ear/Nose/Mouth/Throat Medical History: Negative for: Chronic sinus problems/congestion; Middle ear problems Past Medical History Notes: broken nose Hematologic/Lymphatic Medical History: Negative for: Anemia; Hemophilia; Human Immunodeficiency Virus; Lymphedema; Sickle Cell Disease Respiratory Complaints and Symptoms: No Complaints or Symptoms Medical History: Negative for: Aspiration; Asthma; Chronic Obstructive Pulmonary Disease (COPD); Pneumothorax; Sleep Apnea; Tuberculosis Cardiovascular Complaints and Symptoms: No Complaints or Symptoms Anita Bailey, KWAN ([id number]) Medical History: Positive for: Arrhythmia - PAT; Deep Vein Thrombosis Negative for: Angina; Congestive Heart Failure; Coronary Artery Disease; Hypertension; Hypotension; Myocardial Infarction; Peripheral Arterial Disease; Peripheral Venous Disease; Phlebitis; Vasculitis Past Medical History Notes: PE, HLD Gastrointestinal Medical History: Negative for: Cirrhosis ; Colitis; Crohnos; Hepatitis A; Hepatitis B; Hepatitis C Endocrine Medical History: Negative for: Type I Diabetes; Type II Diabetes Genitourinary Medical History: Negative for: End Stage Renal Disease Immunological Medical History: Negative for: Lupus Erythematosus; Raynaudos; Scleroderma Integumentary (Skin) Medical  History: Negative for: History of Burn; History of pressure wounds Musculoskeletal Medical History: Negative for: Gout; Rheumatoid Arthritis; Osteoarthritis; Osteomyelitis Past Medical History Notes: cervical disc disease Neurologic Medical History: Negative for: Dementia; Neuropathy; Quadriplegia; Paraplegia; Seizure Disorder Past Medical History Notes: currently has a concussion Oncologic Medical History: Negative for: Received Chemotherapy; Received Radiation Past Medical History Notes: melamona Psychiatric Complaints and Symptoms: No Complaints or Symptoms Medical History: ASTRA, GREGG ([id number]) Negative for: Anorexia/bulimia; Confinement Anxiety Immunizations Pneumococcal Vaccine: Received Pneumococcal Vaccination: Yes Immunization Notes: up to date Implantable Devices Family and Social History Cancer: No; Diabetes: No; Heart Disease: No; Hereditary Spherocytosis: No; Hypertension: No; Kidney Disease: No; Lung Disease: No; Seizures: No; Stroke: No; Thyroid Problems: No; Tuberculosis: No; Never smoker; Marital Status - Widowed; Alcohol Use: Rarely; Drug Use: No History; Caffeine Use: Daily; Financial Concerns: No; Food, Clothing or Shelter Needs: No; Support System Lacking: No; Transportation Concerns: No; Advanced Directives: No; Patient does not want information on Advanced Directives Physician Affirmation I have reviewed and agree with the above information. Electronic Signature(s) Signed: 02/23/2018 11:53:49 AM By: Gretta Cool, BSN, RN, CWS, Kim RN, BSN Signed: 02/23/2018 5:08:21 PM By: Worthy Keeler PA-C Entered By:  Worthy Keeler on 02/22/2018 14:30:33 MURIAH, HARSHA ([id number]) -------------------------------------------------------------------------------- [place] Details Patient Name: LIZMARY, NADER. Date of Service: 02/22/2018 Medical Record Number: [id number] Patient Account Number: [id number] Date of Birth/Sex: Dec 16, 1934 (84 y.o. F) Treating RN: Cornell Barman Primary Care Provider: Emily Filbert Other Clinician: Referring Provider: Emily Filbert Treating Provider/Extender: Melburn Hake, Romano Stigger Weeks in Treatment: 2 Diagnosis Coding ICD-10 Codes Code Description [phone] Unspecified open wound of left hand, initial encounter Facility Procedures CPT4 Code: [id number] Description: [phone] - WOUND CARE VISIT-LEV 2 EST PT Modifier: Quantity: 1 Physician Procedures CPT4 Code: [id number] Description: [phone] - WC PHYS LEVEL 2 - EST PT ICD-10 Diagnosis Description Q36.016D Unspecified open wound of left hand, initial encounter Modifier: Quantity: 1 Electronic Signature(s) Signed: 02/23/2018 5:08:21 PM By: Worthy Keeler PA-C Entered By: Worthy Keeler on 02/22/2018 14:46:41

## 2018-03-17 ENCOUNTER — Ambulatory Visit: Payer: Medicare Other | Attending: Otolaryngology

## 2018-03-17 DIAGNOSIS — G4733 Obstructive sleep apnea (adult) (pediatric): Secondary | ICD-10-CM | POA: Insufficient documentation

## 2018-03-24 ENCOUNTER — Other Ambulatory Visit: Payer: Self-pay | Admitting: Internal Medicine

## 2018-03-24 DIAGNOSIS — Z1231 Encounter for screening mammogram for malignant neoplasm of breast: Secondary | ICD-10-CM

## 2018-03-29 DIAGNOSIS — G4733 Obstructive sleep apnea (adult) (pediatric): Secondary | ICD-10-CM | POA: Insufficient documentation

## 2018-06-04 DIAGNOSIS — M818 Other osteoporosis without current pathological fracture: Secondary | ICD-10-CM | POA: Insufficient documentation

## 2018-08-06 ENCOUNTER — Other Ambulatory Visit: Payer: Self-pay | Admitting: Specialist

## 2018-08-06 DIAGNOSIS — M5136 Other intervertebral disc degeneration, lumbar region: Secondary | ICD-10-CM

## 2018-08-09 ENCOUNTER — Other Ambulatory Visit: Payer: Self-pay | Admitting: Internal Medicine

## 2018-08-09 ENCOUNTER — Encounter (INDEPENDENT_AMBULATORY_CARE_PROVIDER_SITE_OTHER): Payer: Self-pay

## 2018-08-09 ENCOUNTER — Ambulatory Visit
Admission: RE | Admit: 2018-08-09 | Discharge: 2018-08-09 | Disposition: A | Discharge status: Home / Self Care | Payer: Medicare Other | Source: Ambulatory Visit | Attending: Internal Medicine | Admitting: Internal Medicine

## 2018-08-09 ENCOUNTER — Other Ambulatory Visit: Payer: Self-pay

## 2018-08-09 DIAGNOSIS — M79604 Pain in right leg: Secondary | ICD-10-CM | POA: Insufficient documentation

## 2018-08-10 ENCOUNTER — Other Ambulatory Visit: Payer: Self-pay | Admitting: Specialist

## 2018-08-10 ENCOUNTER — Ambulatory Visit
Admission: RE | Admit: 2018-08-10 | Discharge: 2018-08-10 | Disposition: A | Discharge status: Home / Self Care | Payer: Medicare Other | Source: Ambulatory Visit | Attending: Specialist | Admitting: Specialist

## 2018-08-10 DIAGNOSIS — M5136 Other intervertebral disc degeneration, lumbar region: Secondary | ICD-10-CM

## 2018-08-10 MED ORDER — METHYLPREDNISOLONE ACETATE 40 MG/ML INJ SUSP (RADIOLOG
120.0000 mg | Freq: Once | INTRAMUSCULAR | Status: AC
  Administered 2018-08-10: 120 mg via EPIDURAL

## 2018-08-10 MED ORDER — IOPAMIDOL (ISOVUE-M 200) INJECTION 41%
1.0000 mL | Freq: Once | INTRAMUSCULAR | Status: AC
  Administered 2018-08-10: 11:00:00 1 mL via EPIDURAL

## 2018-08-10 NOTE — Discharge Instructions (Signed)

## 2018-08-24 ENCOUNTER — Other Ambulatory Visit: Payer: Medicare Other

## 2018-08-25 ENCOUNTER — Ambulatory Visit: Payer: Medicare Other | Attending: Neurology

## 2018-08-25 ENCOUNTER — Other Ambulatory Visit: Payer: Self-pay

## 2018-08-25 DIAGNOSIS — M545 Low back pain, unspecified: Secondary | ICD-10-CM

## 2018-08-25 DIAGNOSIS — M6281 Muscle weakness (generalized): Secondary | ICD-10-CM | POA: Diagnosis present

## 2018-08-25 DIAGNOSIS — G8929 Other chronic pain: Secondary | ICD-10-CM | POA: Diagnosis present

## 2018-08-25 NOTE — Therapy (Signed)
[facility] [address] [place], [address] Phone: [phone]   Fax:  [phone]  Physical Therapy Evaluation  Patient Details  Name: Anita Bailey MRN: [id number] Date of Birth: 08-Jul-1934 Referring Provider (PT): Vladimir Crofts, MD   Encounter Date: 08/25/2018  PT End of Session - 08/25/18 1521    Visit Number  1    Number of Visits  13    Date for PT Re-Evaluation  10/06/18    PT Start Time  4944    PT Stop Time  9675    PT Time Calculation (min)  60 min    Activity Tolerance  Patient tolerated treatment well;No increased pain    Behavior During Therapy  WFL for tasks assessed/performed       Past Medical History:  Diagnosis Date   Anemia    Arthritis    Cancer ([place])    hx of melanoma in 1971, basal cell    Cervical disc disease    Chronic kidney disease    current uti 08/05/11    Depression    DVT (deep venous thrombosis) ([place]) 01/2015   was Eliquis until 06/13/15-    Dysrhythmia    hx of heart racing - in past, "none  in years".  had a stress test > 7 years   History of recurrent UTIs    "bladder infection"  Had a PICC line in past   Hyperlipidemia    Hypertension    Melanoma ([place])    Nephrolithiasis    Pulmonary emboli ([place])    bilateral 8/12 hospitalized    Vitamin D deficiency     Past Surgical History:  Procedure Laterality Date   ABDOMINAL HYSTERECTOMY     1981   BACK SURGERY     BREAST EXCISIONAL BIOPSY Right 1979   Negative   COLONOSCOPY W/ POLYPECTOMY     COLONOSCOPY WITH PROPOFOL N/A 04/29/2017   Procedure: COLONOSCOPY WITH PROPOFOL;  Surgeon: [name], MD;  Location: [facility] ENDOSCOPY;  Service: Endoscopy;  Laterality: N/A;   JOINT REPLACEMENT     bilateral knee   left wrist      surgery x 2    OPEN REDUCTION INTERNAL FIXATION (ORIF) DISTAL RADIAL FRACTURE Right 06/30/2015   Procedure: OPEN REDUCTION INTERNAL FIXATION (ORIF) RIGHT DISTAL  RADIUS FRACTURE ;  Surgeon: [name], MD;  Location: [facility];  Service: Orthopedics;  Laterality: Right;   OTHER SURGICAL HISTORY Right    leg - melonoma   SHOULDER ARTHROSCOPY WITH OPEN ROTATOR CUFF REPAIR Right 10/02/2017   Procedure: Right shoulder mini open rotator cuff repair;  Surgeon: Susa Day, MD;  Location: [facility] ORS;  Service: Orthopedics;  Laterality: Right;  90 mins   TONSILLECTOMY     TOTAL KNEE ARTHROPLASTY  08/11/2011   Procedure: TOTAL KNEE ARTHROPLASTY;  Surgeon: [name], MD;  Location: [facility]S;  Service: Orthopedics;  Laterality: Left;   TOTAL KNEE ARTHROPLASTY Left 2010    There were no vitals filed for this visit.   Subjective Assessment - 08/25/18 1706    Subjective  Pt is an 84 y/o F who presents to primary medical dx of degenerative disc disease (DDD) of the lumbar spine. Her primary complaint is an improved 0/10 current pain, in comparison to a 10/10 sharp, constant pain that radiated from L5 to R anterior thigh, as distal to lateral RLE. Pt responded well to an epidural injection to L5 area two weeks ago, with  pain only increasing to as high as 7-8/10 in the past week. Pts relieving factor is solely pain medications, to which she does not wish to take. Pts aggravating factors are sustained and prolonged activity and movement. Pt lives by herself but has a plethora of family members within a  mile of her home. She wishes to return to prior level of function pain-free, improve her overall endurance, and improve her overall strength.    Pertinent History  HTN, Hyperlipidemia, Cancer, 2006 lumbar decompression for nerve pain    Limitations  Sitting;Lifting;Standing;Walking;House hold activities    Patient Stated Goals  Improve endurance and strength, reduce pain    Currently in Pain?  No/denies    Pain Score  0-No pain         [facility] PT Assessment - 08/25/18 1508      Assessment   Medical Diagnosis  LBP    Referring Provider (PT)  Vladimir Crofts, MD     Prior Therapy  No      Precautions   Precautions  None      Restrictions   Weight Bearing Restrictions  No      Balance Screen   Has the patient fallen in the past 6 months  No    Has the patient had a decrease in activity level because of a fear of falling?   Yes    Is the patient reluctant to leave their home because of a fear of falling?   No      Home Environment   Living Environment  Private residence    Living Arrangements  Alone    Type of [place] to enter    Entrance Stairs-Number of Steps  14    Entrance Stairs-Rails  Can reach both    [place]  One level    [place]  None      Prior Function   Level of [place]  Retired    Leisure  Needle work, shopping, family time      Cognition   Overall Cognitive Status  Within Functional Limits for tasks assessed      Observation/Other Assessments   Other Surveys   Modified Oswestry    Modified Oswestry  26%      Functional Tests   Functional tests  Squat;Single leg stance;Sit to Stand      Squat   Comments  Bilateral knee valgus, anterior lean      Single Leg Stance   Comments  12s R, 8s L      Sit to Stand   Comments  Requires UE support, anterior lean      ROM / Strength   AROM / PROM / Strength  AROM;Strength      AROM   Overall AROM Comments  Lumbar, B Hip, Knee, Ankle ROM WFL, with exception of L ankle DF and B hip ext    AROM Assessment Site  Hip;Knee;Ankle    Right/Left Hip  Right;Left    Right/Left Knee  Right;Left    Right/Left Ankle  Right;Left      Strength   Strength Assessment Site  Knee;Hip;Ankle    Right/Left Hip  Right;Left    Right Hip Flexion  5/5    Right Hip Extension  3/5    Right Hip ABduction  5/5   in sitting   Right Hip ADduction  5/5   in sitting   Left Hip  Flexion  5/5    Left Hip Extension  3+/5    Left Hip ABduction  5/5   in sitting   Left Hip ADduction  5/5   in sitting   Right/Left Knee   Right;Left    Right Knee Flexion  5/5    Right Knee Extension  5/5    Left Knee Flexion  4+/5   inc muscle spasms with prolonged contraction >3s   Left Knee Extension  5/5    Right/Left Ankle  Right;Left    Right Ankle Dorsiflexion  5/5    Right Ankle Plantar Flexion  5/5    Left Ankle Dorsiflexion  5/5    Left Ankle Plantar Flexion  5/5      Palpation   Palpation comment  TTP L4-L5 CPA, R UPA      Special Tests    Special Tests  Sacrolliac Tests    Other special tests  Slump test   Negative   Sacroiliac Tests   Sacral Compression   Negative     Sacral Compression   Findings  Negative    Side   Right      Ambulation/Gait   Ambulation/Gait  Yes    Assistive device  None    Gait Pattern  Within Functional Limits    Ambulation Surface  Level    Stairs  Yes    Stair Management Technique  Two rails;Step to pattern;Forwards    Number of Stairs  4    Height of Stairs  7    Gait Comments  Dec stride length and foot clearance bilaterally      Balance   Balance Assessed  Yes      Static Standing Balance   Static Standing - Balance Support  No upper extremity supported    Static Standing Balance -  Activities   Single Leg Stance - Right Leg;Single Leg Stance - Left Leg;Romberg - Eyes Opened;Romberg - Eyes Closed;Sharpened Romberg - Eyes Open    Static Standing - Comment/# of Minutes  R SLS EO 12s, L SLS EO 8s, all else [facility]       TREATMENT  Therapeutic Exercise to address and improve her lumbar/hip mobility, strength, and activity tolerance with mobility.  Standing lumbar ext with arms straight with UE support -- 2x10 Standing hip ext with RTB and UE support -- 2x10    Objective measurements completed on examination: See above findings.     PT Education - 08/25/18 1521    Education Details  Pt educated on technique/form. Educated on plan of care and prognosis. HEP prescribed as follows:Standing Lumbar ext with arms straight 2x10, Standing Hip ext with RTB 2x10     Person(s) Educated  Patient    Methods  Explanation;Demonstration;Tactile cues;Verbal cues;Handout    Comprehension  Verbalized understanding;Returned demonstration       PT Short Term Goals - 08/25/18 1522      PT SHORT TERM GOAL #1   Title  Pt will be compliant and independent with HEP.    Time  2    Period  Weeks    Status  New    Target Date  09/08/18        PT Long Term Goals - 08/25/18 1523      PT LONG TERM GOAL #1   Title  Pt will score at least a 16% on the Modified Oswestry questionnaire, to demonstrate a significantly improved function and reduction in disability.    Baseline  26%  Time  6    Period  Weeks    Status  New    Target Date  10/06/18      PT LONG TERM GOAL #2   Title  Pt will report a consistent at-worst pain score of 5/10 for one week, to indicate a significantly improved pain response with mobility.    Baseline  7-8/10    Time  6    Period  Weeks    Status  New    Target Date  10/06/18      PT LONG TERM GOAL #3   Title  Pt will be able to perform a deep squat to pick up 20# with pain-free and good technique.    Baseline  Able to perform squat to pick up 10# with heavy cueing on technique required.    Time  6    Period  Weeks    Status  New    Target Date  10/06/18             Plan - 08/25/18 1708    Clinical Impression Statement  Pt is an 84 y/o F who presents to primary PT dx of LBP with R radicular sx to anterior thigh and to distal lateral RLE and secondary dx of impaired B LE strength. Pt demonstrated R hip ext (3/5) and L hip ext (3+/5) via static MMT assessment. Pt performed eyes open SLS R (12 sec) and L (8 sec), as well as increased difficulty with tandem stance bilaterally, indicating impaired static postural stability. Pt displayed impaired squat technique for lifting an object off a floor by anteriorly shifting her weight and demonstrating increased B knee valgus moments. With technique modification to posteriorly shift her  weight and increase hip abduction B, pt able to lift a 10# box x3 consecutively with ease and no exacerbation of sx. Pt also demonstrated impaired STS with anterior weight shift, increased time to perform a single STS, and needing UE support to propel to standing position. Pt demonstrates increased pain response, impaired hip mobility strength, impaired static/dynamic postural stability, and impaired activity tolerance with mobility. Pt will benefit from skilled therapy treatment to improve her aforementioned impairments and return to prior level of function.    Personal Factors and Comorbidities  Comorbidity 1;Comorbidity 2;Comorbidity 3+;Age    Comorbidities  HTN, Hyperlipidemia, Cancer    Examination-Activity Limitations  Carry;Stand;Stairs;Locomotion Level;Bend;Lift;Squat    Examination-Participation Restrictions  Cleaning;Community Activity;Laundry    Stability/Clinical Decision Making  Stable/Uncomplicated    Clinical Decision Making  Low    Rehab Potential  Good    PT Frequency  2x / week    PT Duration  6 weeks    PT Treatment/Interventions  ADLs/Self Care Home Management;Moist Heat;Functional mobility training;Therapeutic activities;Therapeutic exercise;Patient/family education;Neuromuscular re-education;Balance training;Stair training;Manual techniques;Passive range of motion;Joint Manipulations    PT Next Visit Plan  Progress strengthening program and HEP    PT Home Exercise Plan  See education section.    Consulted and Agree with Plan of Care  Patient       Patient will benefit from skilled therapeutic intervention in order to improve the following deficits and impairments:  Decreased balance, Decreased endurance, Decreased mobility, Difficulty walking, Hypomobility, Increased muscle spasms, Impaired sensation, Decreased range of motion, Improper body mechanics, Pain, Impaired flexibility, Decreased strength, Decreased activity tolerance  Visit Diagnosis: 1. Chronic right-sided low  back pain without sciatica   2. Muscle weakness (generalized)        Problem List Patient Active Problem List  Diagnosis Date Noted   Recurrent UTI 12/27/2017   Renal cyst 12/27/2017   Rotator cuff tear, right 10/02/2017   Complete rotator cuff tear 10/02/2017   Essential hypertension 07/31/2016   Renal artery stenosis ([place]) 07/31/2016   DJD (degenerative joint disease) 07/31/2016   Distal radius fracture, right 06/30/2015   S/P left TKA 08/11/2011    Scarlette Calico, SPT 08/25/2018, 5:34 PM  [facility] [address] [place], [address] Phone: [phone]   Fax:  [phone]  Name: ANTANASIA KACZYNSKI MRN: [id number] Date of Birth: 1934/06/11

## 2018-08-26 NOTE — Addendum Note (Signed)
Addended by: Blain Pais on: 08/26/2018 08:37 AM   Modules accepted: Orders

## 2018-08-30 ENCOUNTER — Other Ambulatory Visit: Payer: Self-pay

## 2018-08-30 ENCOUNTER — Ambulatory Visit: Payer: Medicare Other

## 2018-08-30 DIAGNOSIS — M6281 Muscle weakness (generalized): Secondary | ICD-10-CM

## 2018-08-30 DIAGNOSIS — M545 Low back pain, unspecified: Secondary | ICD-10-CM

## 2018-08-30 DIAGNOSIS — G8929 Other chronic pain: Secondary | ICD-10-CM

## 2018-08-30 NOTE — Therapy (Signed)
[facility] [address] [place], [address] Phone: [phone]   Fax:  [phone]  Physical Therapy Treatment  Patient Details  Name: Anita Bailey MRN: [id number] Date of Birth: 1934/11/13 Referring Provider (PT): Vladimir Crofts, MD   Encounter Date: 08/30/2018  PT End of Session - 08/30/18 1034    Visit Number  2    Number of Visits  13    Date for PT Re-Evaluation  10/06/18    PT Start Time  0945    PT Stop Time  1448    PT Time Calculation (min)  45 min    Activity Tolerance  Patient tolerated treatment well;No increased pain    Behavior During Therapy  WFL for tasks assessed/performed       Past Medical History:  Diagnosis Date   Anemia    Arthritis    Cancer ([place])    hx of melanoma in 1971, basal cell    Cervical disc disease    Chronic kidney disease    current uti 08/05/11    Depression    DVT (deep venous thrombosis) ([place]) 01/2015   was Eliquis until 06/13/15-    Dysrhythmia    hx of heart racing - in past, "none  in years".  had a stress test > 7 years   History of recurrent UTIs    "bladder infection"  Had a PICC line in past   Hyperlipidemia    Hypertension    Melanoma ([place])    Nephrolithiasis    Pulmonary emboli ([place])    bilateral 8/12 hospitalized    Vitamin D deficiency     Past Surgical History:  Procedure Laterality Date   ABDOMINAL HYSTERECTOMY     1981   BACK SURGERY     BREAST EXCISIONAL BIOPSY Right 1979   Negative   COLONOSCOPY W/ POLYPECTOMY     COLONOSCOPY WITH PROPOFOL N/A 04/29/2017   Procedure: COLONOSCOPY WITH PROPOFOL;  Surgeon: [name], MD;  Location[facility] ENDOSCOPY;  Service: Endoscopy;  Laterality: N/A;   JOINT REPLACEMENT     bilateral knee   left wrist      surgery x 2    OPEN REDUCTION INTERNAL FIXATION (ORIF) DISTAL RADIAL FRACTURE Right 06/30/2015   Procedure: OPEN REDUCTION INTERNAL FIXATION (ORIF) RIGHT DISTAL  RADIUS FRACTURE ;  Surgeon: [name], MD;  Location: [facility];  Service: Orthopedics;  Laterality: Right;   OTHER SURGICAL HISTORY Right    leg - melonoma   SHOULDER ARTHROSCOPY WITH OPEN ROTATOR CUFF REPAIR Right 10/02/2017   Procedure: Right shoulder mini open rotator cuff repair;  Surgeon: Susa Day, MD;  Location: [facility];  Service: Orthopedics;  Laterality: Right;  90 mins   TONSILLECTOMY     TOTAL KNEE ARTHROPLASTY  08/11/2011   Procedure: TOTAL KNEE ARTHROPLASTY;  Surgeon: [name], MD;  Location: [facility];  Service: Orthopedics;  Laterality: Left;   TOTAL KNEE ARTHROPLASTY Left 2010    There were no vitals filed for this visit.  Subjective Assessment - 08/30/18 1026    Subjective  Pt reports a current 0/10 pain and states that she felt great after last session. Pt reports increased difficulty performing her standing hip ext exercise at home 2/2 to decreased confidence with her balance. Pt states no major changes since last session.    Pertinent History  HTN, Hyperlipidemia, Cancer, 2006 lumbar decompression for nerve pain    Limitations  Sitting;Lifting;Standing;Walking;House hold activities  Patient Stated Goals  Improve endurance and strength, reduce pain    Currently in Pain?  No/denies    Pain Score  0-No pain       TREATMENT   Therapeutic Exercise to address and improve her lumbar/hip mobility, strength, and activity tolerance with mobility.  Standing lumbar ext with arms straight with UE support -- 2x10  Standing hip ext with RTB and UE support -- 2x10 Standing hip abd with RTB and UE support x10 STS 3x5, RTB second set for knee valgus, without UE support Standing squat, 5# 2x10 Standing lumbar ext with arms straight with UE support - x10  Neuro-muscular Re-education to address and improve her activity tolerance and confidence, as well as her static and dynamic postural stability with mobility. SLS without UE support, 2x10 to tolerance (best of 21s L,  26s R) Tandem stance without UE support, 2x10, (L > R, ~10 sec)  Pt able to perform exercises without sx exacerbation.    PT Education - 08/30/18 1033    Education Details  Pt educated on technique/form. HEP modified to add tandem stance at counter and standing hip abd with RTB at counter to tolerance.    Person(s) Educated  Patient    Methods  Explanation;Demonstration;Tactile cues;Verbal cues    Comprehension  Verbalized understanding;Returned demonstration       PT Short Term Goals - 08/25/18 1522      PT SHORT TERM GOAL #1   Title  Pt will be compliant and independent with HEP to continue benefits of therapy after discharge.    Time  2    Period  Weeks    Status  New    Target Date  09/08/18        PT Long Term Goals - 08/25/18 1523      PT LONG TERM GOAL #1   Title  Pt will score at least a 16% on the Modified Oswestry questionnaire, to demonstrate a significantly improved function and reduction in disability.    Baseline  26%    Time  6    Period  Weeks    Status  New    Target Date  10/06/18      PT LONG TERM GOAL #2   Title  Pt will report a consistent at-worst pain score of 5/10 for one week, to indicate a significantly improved pain response with mobility.    Baseline  7-8/10    Time  6    Period  Weeks    Status  New    Target Date  10/06/18      PT LONG TERM GOAL #3   Title  Pt will be able to perform a deep squat to pick up 20# with pain-free and good technique to improve ability to lift heavy boxes around her house.    Baseline  Able to perform squat to pick up 10# with heavy cueing on technique required.    Time  6    Period  Weeks    Status  New    Target Date  10/06/18            Plan - 08/30/18 1035    Clinical Impression Statement  Pt demonstrates initial decreased confidence and fearfulness of falling, so todays session primarily focused on static postural stability exercises, as well as hip/lumbar strengthening and mobility, to  improve her overall confidence with movement. During the session, pt able to perform SLS and tandem stance static balance exercises, with completed times ranging from  21-26 sec and 10-12 sec, respectively. Pt demonstrated improved confidence and performance with increased repetitions, without physical assistance required to maintain upright stance. Pt able to tolerate hip strengthening exercises with increased intensity and volume, with UE support at counter. Pt still demonstrates increased B knee valgus and inconsistent difficulty with anterior weight translation during STS (without UE support), which improved with technique cueing/modifications. Pt able to perform 5x STS in 13.35 sec with RTB around her B knees for hip abd activation purposes, demonstrating decreased fall risk for her age group (52.8 sec 81-89 age). Pt able to perform all exercises without exacerbation of sx and with great technique. Pt still demonstrates impaired activity tolerance, B LE strength, static/dynamic postural stability, increased pain with increased activity loads, and lumbar/hip mobility. Pt will continue to benefit from skilled therapy treatment in order to return to prior level of function and to achieve her long-term goals.    Personal Factors and Comorbidities  Comorbidity 1;Comorbidity 2;Comorbidity 3+;Age    Comorbidities  HTN, Hyperlipidemia, Cancer    Examination-Activity Limitations  Carry;Stand;Stairs;Locomotion Level;Bend;Lift;Squat    Examination-Participation Restrictions  Cleaning;Community Activity;Laundry    Stability/Clinical Decision Making  Stable/Uncomplicated    Rehab Potential  Good    PT Frequency  2x / week    PT Duration  6 weeks    PT Treatment/Interventions  ADLs/Self Care Home Management;Moist Heat;Functional mobility training;Therapeutic activities;Therapeutic exercise;Patient/family education;Neuromuscular re-education;Balance training;Stair training;Manual techniques;Passive range of  motion;Joint Manipulations    PT Next Visit Plan  Progress strengthening program and HEP    PT Home Exercise Plan  See education section.    Consulted and Agree with Plan of Care  Patient       Patient will benefit from skilled therapeutic intervention in order to improve the following deficits and impairments:  Decreased balance, Decreased endurance, Decreased mobility, Difficulty walking, Hypomobility, Increased muscle spasms, Impaired sensation, Decreased range of motion, Improper body mechanics, Pain, Impaired flexibility, Decreased strength, Decreased activity tolerance  Visit Diagnosis: 1. Chronic right-sided low back pain without sciatica   2. Muscle weakness (generalized)        Problem List Patient Active Problem List   Diagnosis Date Noted   Recurrent UTI 12/27/2017   Renal cyst 12/27/2017   Rotator cuff tear, right 10/02/2017   Complete rotator cuff tear 10/02/2017   Essential hypertension 07/31/2016   Renal artery stenosis ([facility]) 07/31/2016   DJD (degenerative joint disease) 07/31/2016   Distal radius fracture, right 06/30/2015   S/P left TKA 08/11/2011    Scarlette Calico, SPT 08/30/2018, 11:52 AM   Lieutenant Diego PT, DPT 11:57 AM,08/30/18 [id number]   This entire session was performed under direct supervision and direction of a licensed therapist/therapist assistant . I have personally read, edited and approve of the note as written.   [facility] [address], [place], [address] Phone: [phone]   Fax:  [phone]  Name: Anita Bailey MRN: [id number] Date of Birth: 12-Jul-1934

## 2018-08-31 ENCOUNTER — Ambulatory Visit
Admission: RE | Admit: 2018-08-31 | Discharge: 2018-08-31 | Disposition: A | Discharge status: Home / Self Care | Payer: Medicare Other | Source: Ambulatory Visit | Attending: Internal Medicine | Admitting: Internal Medicine

## 2018-08-31 DIAGNOSIS — Z1231 Encounter for screening mammogram for malignant neoplasm of breast: Secondary | ICD-10-CM | POA: Insufficient documentation

## 2018-09-02 ENCOUNTER — Ambulatory Visit: Payer: Medicare Other

## 2018-09-02 ENCOUNTER — Other Ambulatory Visit: Payer: Self-pay

## 2018-09-02 DIAGNOSIS — G8929 Other chronic pain: Secondary | ICD-10-CM

## 2018-09-02 DIAGNOSIS — M6281 Muscle weakness (generalized): Secondary | ICD-10-CM

## 2018-09-02 DIAGNOSIS — M545 Low back pain, unspecified: Secondary | ICD-10-CM

## 2018-09-02 NOTE — Therapy (Signed)
[facility] [address] [place], [address] Phone: [phone]   Fax:  [phone]  Physical Therapy Treatment  Patient Details  Name: Anita Bailey MRN: [id number] Date of Birth: [birth year] Referring Provider (PT): Vladimir Crofts, MD   Encounter Date: 09/02/2018  PT End of Session - 09/02/18 0945    Visit Number  3    Number of Visits  13    Date for PT Re-Evaluation  10/06/18    PT Start Time  0900    PT Stop Time  0945    PT Time Calculation (min)  45 min    Activity Tolerance  Patient tolerated treatment well;No increased pain    Behavior During Therapy  WFL for tasks assessed/performed       Past Medical History:  Diagnosis Date   Anemia    Arthritis    Cancer ([place])    hx of melanoma in 1971, basal cell    Cervical disc disease    Chronic kidney disease    current uti 08/05/11    Depression    DVT (deep venous thrombosis) ([place]) 01/2015   was Eliquis until 06/13/15-    Dysrhythmia    hx of heart racing - in past, "none  in years".  had a stress test > 7 years   History of recurrent UTIs    "bladder infection"  Had a PICC line in past   Hyperlipidemia    Hypertension    Melanoma ([place])    Nephrolithiasis    Pulmonary emboli ([place])    bilateral 8/12 hospitalized    Vitamin D deficiency     Past Surgical History:  Procedure Laterality Date   ABDOMINAL HYSTERECTOMY     1981   BACK SURGERY     BREAST EXCISIONAL BIOPSY Right 1979   Negative   COLONOSCOPY W/ POLYPECTOMY     COLONOSCOPY WITH PROPOFOL N/A 04/29/2017   Procedure: COLONOSCOPY WITH PROPOFOL;  Surgeon: [name], MD;  Location: [facility] END[facility]COPY;  Service: Endoscopy;  Laterality: N/A;   JOINT REPLACEMENT     bilateral knee   left wrist      surgery x 2    OPEN REDUCTION INTERNAL FIXATION (ORIF) DISTAL RADIAL FRACTURE Right 06/30/2015   Procedure: OPEN REDUCTION INTERNAL FIXATION (ORIF) RIGHT DISTAL  RADIUS FRACTURE ;  Surgeon: [name], MD;  Location: [place];  Service: Orthopedics;  Laterality: Right;   OTHER SURGICAL HISTORY Right    leg - melonoma   SHOULDER ARTHROSCOPY WITH OPEN ROTATOR CUFF REPAIR Right 10/02/2017   Procedure: Right shoulder mini open rotator cuff repair;  Surgeon: Susa Day, MD;  Location: [facility];  Service: Orthopedics;  Laterality: Right;  90 mins   TONSILLECTOMY     TOTAL KNEE ARTHROPLASTY  08/11/2011   Procedure: TOTAL KNEE ARTHROPLASTY;  Surgeon: [name], MD;  Location: [facility];  Service: Orthopedics;  Laterality: Left;   TOTAL KNEE ARTHROPLASTY Left 2010    There were no vitals filed for this visit.  Subjective Assessment - 09/02/18 0909    Subjective  Pt reports a current 0/10 pain and moderate muscular soreness after last session. Pt statesd no major changes since last session.    Pertinent History  HTN, Hyperlipidemia, Cancer, 2006 lumbar decompression for nerve pain    Limitations  Sitting;Lifting;Standing;Walking;House hold activities    Patient Stated Goals  Improve endurance and strength, reduce pain    Currently in Pain?  No/denies  Pain Score  0-No pain    Multiple Pain Sites  No       TREATMENT   Therapeutic Exercise to address and improve her lumbar/hip mobility, strength, and activity tolerance with mobility.   Standing lumbar ext with arms straight with UE support -- 2x10 Standing hip ext with RTB and UE support -- 2x10 Standing hip abd with RTB and UE support 2x10 STS 3x5, RTB set for knee valgus, without UE support, improved depth Standing squat, 5# 2x10    Pt able to perform exercises without sx exacerbation.       PT Education - 09/02/18 0944    Education Details  Pt educated on technique/form. Continue HEP as is.    Person(s) Educated  Patient    Methods  Explanation;Demonstration;Tactile cues;Verbal cues    Comprehension  Verbalized understanding;Returned demonstration       PT Short Term Goals -  08/25/18 1522      PT SHORT TERM GOAL #1   Title  Pt will be compliant and independent with HEP to continue benefits of therapy after discharge.    Time  2    Period  Weeks    Status  New    Target Date  09/08/18        PT Long Term Goals - 08/25/18 1523      PT LONG TERM GOAL #1   Title  Pt will score at least a 16% on the Modified Oswestry questionnaire, to demonstrate a significantly improved function and reduction in disability.    Baseline  26%    Time  6    Period  Weeks    Status  New    Target Date  10/06/18      PT LONG TERM GOAL #2   Title  Pt will report a consistent at-worst pain score of 5/10 for one week, to indicate a significantly improved pain response with mobility.    Baseline  7-8/10    Time  6    Period  Weeks    Status  New    Target Date  10/06/18      PT LONG TERM GOAL #3   Title  Pt will be able to perform a deep squat to pick up 20# with pain-free and good technique to improve ability to lift heavy boxes around her house.    Baseline  Able to perform squat to pick up 10# with heavy cueing on technique required.    Time  6    Period  Weeks    Status  New    Target Date  10/06/18            Plan - 09/02/18 1100    Clinical Impression Statement  Pt demonstrates improved pain response, as well as improved activity tolerance with progression of exercises, indicating improved functional strength and motor control. Pt able to perform all standing exercises without sx exacerbation, and with improved technique/form. Pt required very minimal verbal cueing for upright standing for hip abd/ext exercises and to increase posterior translation with squats. Pt was able to perform STS with increased depth (to the lowest table height) with RTB around knees, indicating improved hip activation and muscular power/strength. Pt able to perform deeper squats with RTB around knees, indicating improved strength and motor control, as well. Despite her advancements, pt  still demonstrates impaired strength/motor control B LE, impaired lumbar/hip mobility, increased pain with prolonged/sustained activity, and activity tolerance. Pt will continue to benefit from skilled therapy treatment in  order to return to prior level of function.    Personal Factors and Comorbidities  Comorbidity 1;Comorbidity 2;Comorbidity 3+;Age    Comorbidities  HTN, Hyperlipidemia, Cancer    Examination-Activity Limitations  Carry;Stand;Stairs;Locomotion Level;Bend;Lift;Squat    Examination-Participation Restrictions  Cleaning;Community Activity;Laundry    Stability/Clinical Decision Making  Stable/Uncomplicated    Rehab Potential  Good    PT Frequency  2x / week    PT Duration  6 weeks    PT Treatment/Interventions  ADLs/Self Care Home Management;Moist Heat;Functional mobility training;Therapeutic activities;Therapeutic exercise;Patient/family education;Neuromuscular re-education;Balance training;Stair training;Manual techniques;Passive range of motion;Joint Manipulations    PT Next Visit Plan  Progress strengthening program and HEP    PT Home Exercise Plan  See education section.    Consulted and Agree with Plan of Care  Patient       Patient will benefit from skilled therapeutic intervention in order to improve the following deficits and impairments:  Decreased balance, Decreased endurance, Decreased mobility, Difficulty walking, Hypomobility, Increased muscle spasms, Impaired sensation, Decreased range of motion, Improper body mechanics, Pain, Impaired flexibility, Decreased strength, Decreased activity tolerance  Visit Diagnosis: Chronic right-sided low back pain without sciatica  Muscle weakness (generalized)     Problem List Patient Active Problem List   Diagnosis Date Noted   Recurrent UTI 12/27/2017   Renal cyst 12/27/2017   Rotator cuff tear, right 10/02/2017   Complete rotator cuff tear 10/02/2017   Essential hypertension 07/31/2016   Renal artery stenosis  ([place]) 07/31/2016   DJD (degenerative joint disease) 07/31/2016   Distal radius fracture, right 06/30/2015   S/P left TKA 08/11/2011    Scarlette Calico, SPT 09/02/2018, 11:05 AM  [facility] [address], [place], [address] Phone: [phone]   Fax:  [phone]  Name: Anita Bailey MRN: [id number] Date of Birth: 1934/04/09

## 2018-09-06 ENCOUNTER — Other Ambulatory Visit: Payer: Self-pay

## 2018-09-06 ENCOUNTER — Ambulatory Visit: Payer: Medicare Other

## 2018-09-06 DIAGNOSIS — M545 Low back pain, unspecified: Secondary | ICD-10-CM

## 2018-09-06 DIAGNOSIS — M6281 Muscle weakness (generalized): Secondary | ICD-10-CM

## 2018-09-06 DIAGNOSIS — G8929 Other chronic pain: Secondary | ICD-10-CM

## 2018-09-06 NOTE — Therapy (Signed)
[facility] [address] [place], [address] Phone: [phone]   Fax:  [phone]  Physical Therapy Treatment  Patient Details  Name: Anita Bailey MRN: [id number] Date of Birth: May 20, 1934 Referring Provider (PT): Vladimir Crofts, MD   Encounter Date: 09/06/2018  PT End of Session - 09/06/18 1031    Visit Number  4    Number of Visits  13    Date for PT Re-Evaluation  10/06/18    PT Start Time  0945    PT Stop Time  [id number]    PT Time Calculation (min)  45 min    Activity Tolerance  Patient tolerated treatment well;No increased pain    Behavior During Therapy  WFL for tasks assessed/performed       Past Medical History:  Diagnosis Date   Anemia    Arthritis    Cancer ([place])    hx of melanoma in 1971, basal cell    Cervical disc disease    Chronic kidney disease    current uti 08/05/11    Depression    DVT (deep venous thrombosis) ([place]) 01/2015   was Eliquis until 06/13/15-    Dysrhythmia    hx of heart racing - in past, "none  in years".  had a stress test > 7 years   History of recurrent UTIs    "bladder infection"  Had a PICC line in past   Hyperlipidemia    Hypertension    Melanoma ([place])    Nephrolithiasis    Pulmonary emboli ([place])    bilateral 8/12 hospitalized    Vitamin D deficiency     Past Surgical History:  Procedure Laterality Date   ABDOMINAL HYSTERECTOMY     1981   BACK SURGERY     BREAST EXCISIONAL BIOPSY Right 1979   Negative   COLONOSCOPY W/ POLYPECTOMY     COLONOSCOPY WITH PROPOFOL N/A 04/29/2017   Procedure: COLONOSCOPY WITH PROPOFOL;  Surgeon: [name], MD;  Location: [facility] ENDOSCOPY;  Service: Endoscopy;  Laterality: N/A;   JOINT REPLACEMENT     bilateral knee   left wrist      surgery x 2    OPEN REDUCTION INTERNAL FIXATION (ORIF) DISTAL RADIAL FRACTURE Right 06/30/2015   Procedure: OPEN REDUCTION INTERNAL FIXATION (ORIF) RIGHT DISTAL  RADIUS FRACTURE ;  Surgeon: [name], MD;  Location: [place];  Service: Orthopedics;  Laterality: Right;   OTHER SURGICAL HISTORY Right    leg - melonoma   SHOULDER ARTHROSCOPY WITH OPEN ROTATOR CUFF REPAIR Right 10/02/2017   Procedure: Right shoulder mini open rotator cuff repair;  Surgeon: Susa Day, MD;  [facility];  Service: Orthopedics;  Laterality: Right;  90 mins   TONSILLECTOMY     TOTAL KNEE ARTHROPLASTY  08/11/2011   Procedure: TOTAL KNEE ARTHROPLASTY;  Surgeon: [name], MD;  [facility];  Service: Orthopedics;  Laterality: Left;   TOTAL KNEE ARTHROPLASTY Left 2010    There were no vitals filed for this visit.  Subjective Assessment - 09/06/18 0954    Subjective  Pt reports a current 0/10 pain without any recent sx exacerbation. Pt states that she has been able to tell a significant improvement in her sx after working outdoors, citing only muscular soreness.    Pertinent History  HTN, Hyperlipidemia, Cancer, 2006 lumbar decompression for nerve pain    Limitations  Sitting;Lifting;Standing;Walking;House hold activities    Patient Stated Goals  Improve endurance and  strength, reduce pain    Currently in Pain?  No/denies    Pain Score  0-No pain        TREATMENT   Therapeutic Exercise to address and improve her lumbar/hip mobility, strength, and activity tolerance with mobility.   Standing lumbar ext with arms straight with UE support -- 2x10 Standing hip ext with GTB and UE support -- 2x10 Standing hip abd with GTB and UE support 2x10 Forward [employer] with GTB (around knees) 15 feet x 4 Rotational Chops with 6# 2x10 Standing squat, 5# 2x10, 10# 1x5   At the end of the session, pt reported moderate muscle soreness.         PT Education - 09/06/18 1028    Education Details  Pt educated on technique/form.    Person(s) Educated  Patient    Methods  Explanation;Demonstration;Verbal cues    Comprehension  Verbalized  understanding;Returned demonstration       PT Short Term Goals - 08/25/18 1522      PT SHORT TERM GOAL #1   Title  Pt will be compliant and independent with HEP to continue benefits of therapy after discharge.    Time  2    Period  Weeks    Status  New    Target Date  09/08/18        PT Long Term Goals - 08/25/18 1523      PT LONG TERM GOAL #1   Title  Pt will score at least a 16% on the Modified Oswestry questionnaire, to demonstrate a significantly improved function and reduction in disability.    Baseline  26%    Time  6    Period  Weeks    Status  New    Target Date  10/06/18      PT LONG TERM GOAL #2   Title  Pt will report a consistent at-worst pain score of 5/10 for one week, to indicate a significantly improved pain response with mobility.    Baseline  7-8/10    Time  6    Period  Weeks    Status  New    Target Date  10/06/18      PT LONG TERM GOAL #3   Title  Pt will be able to perform a deep squat to pick up 20# with pain-free and good technique to improve ability to lift heavy boxes around her house.    Baseline  Able to perform squat to pick up 10# with heavy cueing on technique required.    Time  6    Period  Weeks    Status  New    Target Date  10/06/18            Plan - 09/06/18 1040    Clinical Impression Statement  Pt continues to demonstrate improved pain response, as well as improved activity tolerance with progression of exercises without increase in sx. Pt able to perform all standing exercises with improved technique/form, as indicated by decreased need for verbal cueing. Pt able to perform and tolerate rotational chops with 6# med ball, forward monster walks with GTB, and goblet squats of up to 10# with good form, indicating improved strength and endurance. Despite her advancements, pt still demonstrates impaired strength/motor control B LE, impaired lumbar/hip mobility, increased pain with prolonged/sustained activity, and activity tolerance.  Pt will continue to benefit from skilled therapy treatment in order to return to prior level of function.    Personal Factors and Comorbidities  Comorbidity 1;Comorbidity 2;Comorbidity 3+;Age    Comorbidities  HTN, Hyperlipidemia, Cancer    Examination-Activity Limitations  Carry;Stand;Stairs;Locomotion Level;Bend;Lift;Squat    Examination-Participation Restrictions  Cleaning;Community Activity;Laundry    Stability/Clinical Decision Making  Stable/Uncomplicated    Rehab Potential  Good    PT Frequency  2x / week    PT Duration  6 weeks    PT Treatment/Interventions  ADLs/Self Care Home Management;Moist Heat;Functional mobility training;Therapeutic activities;Therapeutic exercise;Patient/family education;Neuromuscular re-education;Balance training;Stair training;Manual techniques;Passive range of motion;Joint Manipulations    PT Next Visit Plan  Progress strengthening program and HEP    PT Home Exercise Plan  See education section.    Consulted and Agree with Plan of Care  Patient       Patient will benefit from skilled therapeutic intervention in order to improve the following deficits and impairments:  Decreased balance, Decreased endurance, Decreased mobility, Difficulty walking, Hypomobility, Increased muscle spasms, Impaired sensation, Decreased range of motion, Improper body mechanics, Pain, Impaired flexibility, Decreased strength, Decreased activity tolerance  Visit Diagnosis: Chronic right-sided low back pain without sciatica  Muscle weakness (generalized)     Problem List Patient Active Problem List   Diagnosis Date Noted   Recurrent UTI 12/27/2017   Renal cyst 12/27/2017   Rotator cuff tear, right 10/02/2017   Complete rotator cuff tear 10/02/2017   Essential hypertension 07/31/2016   Renal artery stenosis ([place]) 07/31/2016   DJD (degenerative joint disease) 07/31/2016   Distal radius fracture, right 06/30/2015   S/P left TKA 08/11/2011    Scarlette Calico,  SPT 09/06/2018, 10:43 AM  [facility] [address], [place], [address] Phone: [phone]   Fax:  [phone]  Name: Anita Bailey MRN: [id number] Date of Birth: 1934/07/03

## 2018-09-09 ENCOUNTER — Ambulatory Visit: Payer: Medicare Other

## 2018-09-13 ENCOUNTER — Ambulatory Visit: Payer: Medicare Other

## 2018-09-13 ENCOUNTER — Other Ambulatory Visit: Payer: Self-pay

## 2018-09-13 DIAGNOSIS — G8929 Other chronic pain: Secondary | ICD-10-CM

## 2018-09-13 DIAGNOSIS — M6281 Muscle weakness (generalized): Secondary | ICD-10-CM

## 2018-09-13 DIAGNOSIS — M545 Low back pain, unspecified: Secondary | ICD-10-CM

## 2018-09-13 NOTE — Therapy (Signed)
[facility] [address], [place], [address] Phone: [phone]   Fax:  [phone]  Physical Therapy Treatment  Patient Details  Name: Anita Bailey MRN: [id number] Date of Birth: January 18, 1934 Referring Provider (PT): Vladimir Crofts, MD   Encounter Date: 09/13/2018  PT End of Session - 09/13/18 1003    Visit Number  5    Number of Visits  13    Date for PT Re-Evaluation  10/06/18    PT Start Time  0945    PT Stop Time  [id number]    PT Time Calculation (min)  45 min    Activity Tolerance  Patient tolerated treatment well;No increased pain    Behavior During Therapy  WFL for tasks assessed/performed       Past Medical History:  Diagnosis Date  . Anemia   . Arthritis   . Cancer ([place])    hx of melanoma in 1971, basal cell   . Cervical disc disease   . Chronic kidney disease    current uti 08/05/11   . Depression   . DVT (deep venous thrombosis) ([place]) 01/2015   was Eliquis until 06/13/15-   . Dysrhythmia    hx of heart racing - in past, "none  in years".  had a stress test > 7 years  . History of recurrent UTIs    "bladder infection"  Had a PICC line in past  . Hyperlipidemia   . Hypertension   . Melanoma ([place])   . Nephrolithiasis   . Pulmonary emboli ([facility])    bilateral 8/12 hospitalized   . Vitamin D deficiency     Past Surgical History:  Procedure Laterality Date  . ABDOMINAL HYSTERECTOMY     1981  . BACK SURGERY    . BREAST EXCISIONAL BIOPSY Right 1979   Negative  . COLONOSCOPY W/ POLYPECTOMY    . COLONOSCOPY WITH PROPOFOL N/A 04/29/2017   Procedure: COLONOSCOPY WITH PROPOFOL;  Surgeon: [name], MD;  Location: [facility] END[facility]COPY;  Service: Endoscopy;  Laterality: N/A;  . JOINT REPLACEMENT     bilateral knee  . left wrist      surgery x 2   . OPEN REDUCTION INTERNAL FIXATION (ORIF) DISTAL RADIAL FRACTURE Right 06/30/2015   Procedure: OPEN REDUCTION INTERNAL FIXATION (ORIF) RIGHT DISTAL  RADIUS FRACTURE ;  Surgeon: [name], MD;  Location: [place];  Service: Orthopedics;  Laterality: Right;  . OTHER SURGICAL HISTORY Right    leg - melonoma  . SHOULDER ARTHROSCOPY WITH OPEN ROTATOR CUFF REPAIR Right 10/02/2017   Procedure: Right shoulder mini open rotator cuff repair;  Surgeon: Susa Day, MD;  [facility];  Service: Orthopedics;  Laterality: Right;  90 mins  . TONSILLECTOMY    . TOTAL KNEE ARTHROPLASTY  08/11/2011   Procedure: TOTAL KNEE ARTHROPLASTY;  Surgeon: [name], MD;  [facility];  Service: Orthopedics;  Laterality: Left;  . TOTAL KNEE ARTHROPLASTY Left 2010    There were no vitals filed for this visit.  Subjective Assessment - 09/13/18 0954    Subjective  Patient reports she had an increase in pain from picking up sticks the previous Thursday which resulted in her cancelling her previous session.    Pertinent History  HTN, Hyperlipidemia, Cancer, 2006 lumbar decompression for nerve pain    Limitations  Sitting;Lifting;Standing;Walking;House hold activities    Patient Stated Goals  Improve endurance and strength, reduce pain    Currently in Pain?  Yes    Pain Score  2     Pain Location  Back    Pain Orientation  Lower    Pain Descriptors / Indicators  Aching    Pain Type  Chronic pain    Pain Onset  More than a month ago    Pain Frequency  Intermittent        TREATMENT Therapeutic Exercise  Seated multifidus crunch - x 3min Hip swing laterally - x 20  Forward/backward leg swings - x 20  Lumbar extension in standing with arms straight performed throughout CKC LTRs in hooklying - x 20  LTRs with feet elevated  -- x 10  Transfers from lying to standing - x 2 Single leg stance - 3 x 10 sec  Tandem stance ball toss - x 20  Running man in standing - x 10  Performed exercise to address limitations and strength and to improve pain with activation of the multifidus musculature.    PT Education - 09/13/18 1000    Education Details   form/technique with exercise    Person(s) Educated  Patient    Methods  Explanation;Demonstration    Comprehension  Verbalized understanding;Returned demonstration       PT Short Term Goals - 08/25/18 1522      PT SHORT TERM GOAL #1   Title  Pt will be compliant and independent with HEP to continue benefits of therapy after discharge.    Time  2    Period  Weeks    Status  New    Target Date  09/08/18        PT Long Term Goals - 08/25/18 1523      PT LONG TERM GOAL #1   Title  Pt will score at least a 16% on the Modified Oswestry questionnaire, to demonstrate a significantly improved function and reduction in disability.    Baseline  26%    Time  6    Period  Weeks    Status  New    Target Date  10/06/18      PT LONG TERM GOAL #2   Title  Pt will report a consistent at-worst pain score of 5/10 for one week, to indicate a significantly improved pain response with mobility.    Baseline  7-8/10    Time  6    Period  Weeks    Status  New    Target Date  10/06/18      PT LONG TERM GOAL #3   Title  Pt will be able to perform a deep squat to pick up 20# with pain-free and good technique to improve ability to lift heavy boxes around her house.    Baseline  Able to perform squat to pick up 10# with heavy cueing on technique required.    Time  6    Period  Weeks    Status  New    Target Date  10/06/18            Plan - 09/13/18 1019    Clinical Impression Statement  Focused on improving hip stabilization in standing most notably in single leg stance as patient deomsntrates reproduction of concordant pain with standing. Patient demonstrates improvement with symptoms at the end of the session most notably after performing mobility and strengthening exercises. Patient will benefit from further skilled therapy to return to prior level of function.    Personal Factors and Comorbidities  Comorbidity 1;Comorbidity 2;Comorbidity 3+;Age    Comorbidities  HTN,  Hyperlipidemia,  Cancer    Examination-Activity Limitations  Carry;Stand;Stairs;Locomotion Level;Bend;Lift;Squat    Examination-Participation Restrictions  Cleaning;Community Activity;Laundry    Stability/Clinical Decision Making  Stable/Uncomplicated    Rehab Potential  Good    PT Frequency  2x / week    PT Duration  6 weeks    PT Treatment/Interventions  ADLs/Self Care Home Management;Moist Heat;Functional mobility training;Therapeutic activities;Therapeutic exercise;Patient/family education;Neuromuscular re-education;Balance training;Stair training;Manual techniques;Passive range of motion;Joint Manipulations    PT Next Visit Plan  Progress strengthening program and HEP    PT Home Exercise Plan  See education section.    Consulted and Agree with Plan of Care  Patient       Patient will benefit from skilled therapeutic intervention in order to improve the following deficits and impairments:  Decreased balance, Decreased endurance, Decreased mobility, Difficulty walking, Hypomobility, Increased muscle spasms, Impaired sensation, Decreased range of motion, Improper body mechanics, Pain, Impaired flexibility, Decreased strength, Decreased activity tolerance  Visit Diagnosis: Chronic right-sided low back pain without sciatica  Muscle weakness (generalized)     Problem List Patient Active Problem List   Diagnosis Date Noted  . Recurrent UTI 12/27/2017  . Renal cyst 12/27/2017  . Rotator cuff tear, right 10/02/2017  . Complete rotator cuff tear 10/02/2017  . Essential hypertension 07/31/2016  . Renal artery stenosis ([place]) 07/31/2016  . DJD (degenerative joint disease) 07/31/2016  . Distal radius fracture, right 06/30/2015  . S/P left TKA 08/11/2011    Blythe Stanford, PT DPT 09/13/2018, 10:34 AM  [facility] [address] [place], [address] Phone: [phone]   Fax:  [phone]  Name: Anita Bailey MRN: [id number] Date  of Birth: April 07, 1934

## 2018-09-16 ENCOUNTER — Other Ambulatory Visit: Payer: Self-pay

## 2018-09-16 ENCOUNTER — Ambulatory Visit: Payer: Medicare Other | Attending: Neurology

## 2018-09-16 DIAGNOSIS — M545 Low back pain, unspecified: Secondary | ICD-10-CM

## 2018-09-16 DIAGNOSIS — M6281 Muscle weakness (generalized): Secondary | ICD-10-CM | POA: Diagnosis present

## 2018-09-16 DIAGNOSIS — G8929 Other chronic pain: Secondary | ICD-10-CM | POA: Diagnosis present

## 2018-09-16 NOTE — Therapy (Signed)
[facility] [address], [place], [address] Phone: [phone]   Fax:  [phone]  Physical Therapy Treatment  Patient Details  Name: Anita Bailey MRN: [id number] Date of Birth: [birth year] Referring Provider (PT): Vladimir Crofts, MD   Encounter Date: 09/16/2018  PT End of Session - 09/16/18 1053    Visit Number  6    Number of Visits  13    Date for PT Re-Evaluation  10/06/18    PT Start Time  [id number]    PT Stop Time  1115    PT Time Calculation (min)  40 min    Activity Tolerance  Patient tolerated treatment well;No increased pain    Behavior During Therapy  WFL for tasks assessed/performed       Past Medical History:  Diagnosis Date  . Anemia   . Arthritis   . Cancer ([place])    hx of melanoma in 1971, basal cell   . Cervical disc disease   . Chronic kidney disease    current uti 08/05/11   . Depression   . DVT (deep venous thrombosis) ([place]) 01/2015   was Eliquis until 06/13/15-   . Dysrhythmia    hx of heart racing - in past, "none  in years".  had a stress test > 7 years  . History of recurrent UTIs    "bladder infection"  Had a PICC line in past  . Hyperlipidemia   . Hypertension   . Melanoma ([place])   . Nephrolithiasis   . Pulmonary emboli ([facility])    bilateral 8/12 hospitalized   . Vitamin D deficiency     Past Surgical History:  Procedure Laterality Date  . ABDOMINAL HYSTERECTOMY     1981  . BACK SURGERY    . BREAST EXCISIONAL BIOPSY Right 1979   Negative  . COLONOSCOPY W/ POLYPECTOMY    . COLONOSCOPY WITH PROPOFOL N/A 04/29/2017   Procedure: COLONOSCOPY WITH PROPOFOL;  Surgeon: [name], MD;  Location: [facility] END[facility]COPY;  Service: Endoscopy;  Laterality: N/A;  . JOINT REPLACEMENT     bilateral knee  . left wrist      surgery x 2   . OPEN REDUCTION INTERNAL FIXATION (ORIF) DISTAL RADIAL FRACTURE Right 06/30/2015   Procedure: OPEN REDUCTION INTERNAL FIXATION (ORIF) RIGHT DISTAL  RADIUS FRACTURE ;  Surgeon: [name], MD;  Location: [facility];  Service: Orthopedics;  Laterality: Right;  . OTHER SURGICAL HISTORY Right    leg - melonoma  . SHOULDER ARTHROSCOPY WITH OPEN ROTATOR CUFF REPAIR Right 10/02/2017   Procedure: Right shoulder mini open rotator cuff repair;  Surgeon: Susa Day, MD;  Location: [facility];  Service: Orthopedics;  Laterality: Right;  90 mins  . TONSILLECTOMY    . TOTAL KNEE ARTHROPLASTY  08/11/2011   Procedure: TOTAL KNEE ARTHROPLASTY;  Surgeon: [name], MD;  Location: [facility];  Service: Orthopedics;  Laterality: Left;  . TOTAL KNEE ARTHROPLASTY Left 2010    There were no vitals filed for this visit.  Subjective Assessment - 09/16/18 1041    Subjective  Patient states she has difficulty with bending and states she had increased pain with waking up in the morning. Patient states she is worried about needing another epidural.    Pertinent History  HTN, Hyperlipidemia, Cancer, 2006 lumbar decompression for nerve pain    Limitations  Sitting;Lifting;Standing;Walking;House hold activities    Patient Stated Goals  Improve endurance and strength, reduce pain  Currently in Pain?  Yes    Pain Score  2     Pain Location  Back    Pain Orientation  Lower    Pain Descriptors / Indicators  Aching    Pain Type  Chronic pain    Pain Onset  More than a month ago    Pain Frequency  Intermittent       TREATMENT Therapeutic Exercise  Standing lumbar extension with straight arms - x 10 Prone press ups - 3 x 10 Seated multifidus crunch - x 57min Sit to standing with performing posterior pelvic tilt - x 10  Squats in standing with holding 10# weight - 2 x 10  Golfers lift in standing  -- x 10  Forward T - RDL - x 20  Performed exercise to address limitations and strength and to improve pain with activation of the multifidus musculature.    PT Education - 09/16/18 1047    Education Details  form/technique with exercise    Person(s) Educated   Patient    Methods  Explanation;Demonstration    Comprehension  Verbalized understanding;Returned demonstration       PT Short Term Goals - 08/25/18 1522      PT SHORT TERM GOAL #1   Title  Pt will be compliant and independent with HEP to continue benefits of therapy after discharge.    Time  2    Period  Weeks    Status  New    Target Date  09/08/18        PT Long Term Goals - 08/25/18 1523      PT LONG TERM GOAL #1   Title  Pt will score at least a 16% on the Modified Oswestry questionnaire, to demonstrate a significantly improved function and reduction in disability.    Baseline  26%    Time  6    Period  Weeks    Status  New    Target Date  10/06/18      PT LONG TERM GOAL #2   Title  Pt will report a consistent at-worst pain score of 5/10 for one week, to indicate a significantly improved pain response with mobility.    Baseline  7-8/10    Time  6    Period  Weeks    Status  New    Target Date  10/06/18      PT LONG TERM GOAL #3   Title  Pt will be able to perform a deep squat to pick up 20# with pain-free and good technique to improve ability to lift heavy boxes around her house.    Baseline  Able to perform squat to pick up 10# with heavy cueing on technique required.    Time  6    Period  Weeks    Status  New    Target Date  10/06/18            Plan - 09/16/18 1112    Clinical Impression Statement  Patient demonstrates improvement of pain and spasms focused on improving anterior pelvic tilt with squatting motions. Patient required tactile cueing to perform with correct form and technique with exercise. Patient deomnstrates improvement overall but will improve motor coordination with bending activities. Patient will benefit from further skilled therapy to return to prior level of function.    Personal Factors and Comorbidities  Comorbidity 1;Comorbidity 2;Comorbidity 3+;Age    Comorbidities  HTN, Hyperlipidemia, Cancer    Examination-Activity Limitations   Carry;Stand;Stairs;Locomotion Level;Bend;Lift;Squat  Examination-Participation Restrictions  Cleaning;Community Activity;Laundry    Stability/Clinical Decision Making  Stable/Uncomplicated    Rehab Potential  Good    PT Frequency  2x / week    PT Duration  6 weeks    PT Treatment/Interventions  ADLs/Self Care Home Management;Moist Heat;Functional mobility training;Therapeutic activities;Therapeutic exercise;Patient/family education;Neuromuscular re-education;Balance training;Stair training;Manual techniques;Passive range of motion;Joint Manipulations    PT Next Visit Plan  Progress strengthening program and HEP    PT Home Exercise Plan  See education section.    Consulted and Agree with Plan of Care  Patient       Patient will benefit from skilled therapeutic intervention in order to improve the following deficits and impairments:  Decreased balance, Decreased endurance, Decreased mobility, Difficulty walking, Hypomobility, Increased muscle spasms, Impaired sensation, Decreased range of motion, Improper body mechanics, Pain, Impaired flexibility, Decreased strength, Decreased activity tolerance  Visit Diagnosis: Chronic right-sided low back pain without sciatica  Muscle weakness (generalized)     Problem List Patient Active Problem List   Diagnosis Date Noted  . Recurrent UTI 12/27/2017  . Renal cyst 12/27/2017  . Rotator cuff tear, right 10/02/2017  . Complete rotator cuff tear 10/02/2017  . Essential hypertension 07/31/2016  . Renal artery stenosis ([place]) 07/31/2016  . DJD (degenerative joint disease) 07/31/2016  . Distal radius fracture, right 06/30/2015  . S/P left TKA 08/11/2011    Blythe Stanford, PT DPT 09/16/2018, 12:48 PM  [facility] [address] [place], [address] Phone: [phone]   Fax:  [phone]  Name: Anita Bailey MRN: [id number] Date of Birth: 08/05/1934

## 2018-09-21 ENCOUNTER — Ambulatory Visit: Payer: Medicare Other

## 2018-09-21 ENCOUNTER — Other Ambulatory Visit: Payer: Self-pay

## 2018-09-21 DIAGNOSIS — M545 Low back pain, unspecified: Secondary | ICD-10-CM

## 2018-09-21 DIAGNOSIS — M6281 Muscle weakness (generalized): Secondary | ICD-10-CM

## 2018-09-21 DIAGNOSIS — G8929 Other chronic pain: Secondary | ICD-10-CM

## 2018-09-21 NOTE — Therapy (Signed)
[facility] [address] [place], [address] Phone: [phone]   Fax:  [phone]  Physical Therapy Treatment  Patient Details  Name: Anita Bailey MRN: [id number] Date of Birth: 01-22-1934 Referring Provider (PT): Vladimir Crofts, MD   Encounter Date: 09/21/2018  PT End of Session - 09/21/18 1538    Visit Number  7    Number of Visits  13    Date for PT Re-Evaluation  10/06/18    PT Start Time  1526    PT Stop Time  1600    PT Time Calculation (min)  34 min    Activity Tolerance  Patient tolerated treatment well;No increased pain    Behavior During Therapy  WFL for tasks assessed/performed       Past Medical History:  Diagnosis Date  . Anemia   . Arthritis   . Cancer ([place])    hx of melanoma in 1971, basal cell   . Cervical disc disease   . Chronic kidney disease    current uti 08/05/11   . Depression   . DVT (deep venous thrombosis) ([place]) 01/2015   was Eliquis until 06/13/15-   . Dysrhythmia    hx of heart racing - in past, "none  in years".  had a stress test > 7 years  . History of recurrent UTIs    "bladder infection"  Had a PICC line in past  . Hyperlipidemia   . Hypertension   . Melanoma ([place])   . Nephrolithiasis   . Pulmonary emboli ([facility])    bilateral 8/12 hospitalized   . Vitamin D deficiency     Past Surgical History:  Procedure Laterality Date  . ABDOMINAL HYSTERECTOMY     1981  . BACK SURGERY    . BREAST EXCISIONAL BIOPSY Right 1979   Negative  . COLONOSCOPY W/ POLYPECTOMY    . COLONOSCOPY WITH PROPOFOL N/A 04/29/2017   Procedure: COLONOSCOPY WITH PROPOFOL;  Surgeon: [name], MD;  Location: [facility] END[facility];  Service: Endoscopy;  Laterality: N/A;  . JOINT REPLACEMENT     bilateral knee  . left wrist      surgery x 2   . OPEN REDUCTION INTERNAL FIXATION (ORIF) DISTAL RADIAL FRACTURE Right 06/30/2015   Procedure: OPEN REDUCTION INTERNAL FIXATION (ORIF) RIGHT DISTAL  RADIUS FRACTURE ;  Surgeon: [name], MD;  Location: [facility];  Service: Orthopedics;  Laterality: Right;  . OTHER SURGICAL HISTORY Right    leg - melonoma  . SHOULDER ARTHROSCOPY WITH OPEN ROTATOR CUFF REPAIR Right 10/02/2017   Procedure: Right shoulder mini open rotator cuff repair;  Surgeon: Susa Day, MD;  Location: [facility];  Service: Orthopedics;  Laterality: Right;  90 mins  . TONSILLECTOMY    . TOTAL KNEE ARTHROPLASTY  08/11/2011   Procedure: TOTAL KNEE ARTHROPLASTY;  Surgeon: [name], MD;  Location: [facility];  Service: Orthopedics;  Laterality: Left;  . TOTAL KNEE ARTHROPLASTY Left 2010    There were no vitals filed for this visit.  Subjective Assessment - 09/21/18 1531    Subjective  Patient reports her back has been in increase pain since the weekend. Patient attributes this increase pain to the epidural 'wearing off'    Pertinent History  HTN, Hyperlipidemia, Cancer, 2006 lumbar decompression for nerve pain    Limitations  Sitting;Lifting;Standing;Walking;House hold activities    Patient Stated Goals  Improve endurance and strength, reduce pain    Currently in Pain?  Yes  Pain Score  2     Pain Location  Back    Pain Orientation  Lower    Pain Descriptors / Indicators  Aching    Pain Type  Chronic pain    Pain Onset  More than a month ago    Pain Frequency  Intermittent         TREATMENT   Manual therapy Soft tissue mobilization, myofascial release, and trigger point release to lumbar thoracic paraspinals, left QL, left obliques for relief of pain and improved efficacy with extension mobilizations P-A glides grades 1-2 to T7-T11 20 glides each to increase joint mobility and extension  Therapeutic activity Sit to stand x4 with therapist cues for anterior pelvic tilt/lumbar extension to avoid radicular symptoms and prevent further disc derangement Patient education to use lumbar roll to maintain lumbar lordosis while seated and while in golf cart during  upcoming vacation; education to remain mobile at regular intervals with standing extension exercise Patient education to avoid unnecessary use of SIJ belt to maintain optimal neuromotor patterning of hip and low back musculature  Therapeutic exercise Prone on elbows x 20 with concurrent mobilization of T7, T8 for increased mobility of thoracic spine and reduced stress on lumbosacral junction and herniated disc - added to HEP Prone press-up x 10 - note limitations in extension with patient hips rising off table and hinging at lumbosacral and thoracolumbar junction Extension in standing x 10 with verbal cues and demonstration for correct technique - added to HEP Bridging x 5, segmental bridging x 10 with verbal and tactile cues for exercise technique and stabilization of hips Belt-assisted thoracic extension self-SNAG 3 x 10 for increased mobility of thoracic spine and reduced stress on lumbosacral junction and herniated disc - added to HEP    PT Education - 09/21/18 1535    Education Details  form/technique with exercise    Person(s) Educated  Patient    Methods  Explanation;Demonstration    Comprehension  Verbalized understanding;Returned demonstration       PT Short Term Goals - 08/25/18 1522      PT SHORT TERM GOAL #1   Title  Pt will be compliant and independent with HEP to continue benefits of therapy after discharge.    Time  2    Period  Weeks    Status  New    Target Date  09/08/18        PT Long Term Goals - 08/25/18 1523      PT LONG TERM GOAL #1   Title  Pt will score at least a 16% on the Modified Oswestry questionnaire, to demonstrate a significantly improved function and reduction in disability.    Baseline  26%    Time  6    Period  Weeks    Status  New    Target Date  10/06/18      PT LONG TERM GOAL #2   Title  Pt will report a consistent at-worst pain score of 5/10 for one week, to indicate a significantly improved pain response with mobility.    Baseline   7-8/10    Time  6    Period  Weeks    Status  New    Target Date  10/06/18      PT LONG TERM GOAL #3   Title  Pt will be able to perform a deep squat to pick up 20# with pain-free and good technique to improve ability to lift heavy boxes around her house.  Baseline  Able to perform squat to pick up 10# with heavy cueing on technique required.    Time  6    Period  Weeks    Status  New    Target Date  10/06/18            Plan - 09/21/18 1612    Clinical Impression Statement  Patient shows antalgic motor patterning with rising from bed and from chair. Abolishment of radicular pain following activity modification (lumbar extension with rising from chair). Moderate limitations CPAs in T7-T11, L2-L4 and physiologic extension with hinging at lumbosacral and thoracolumbar junction. Good tolerance with McKenzie extension protocol in prone to reduce intervertebral disc. Patient will benefit from further skilled therapy to return to prior level of function.    Personal Factors and Comorbidities  Comorbidity 1;Comorbidity 2;Comorbidity 3+;Age    Comorbidities  HTN, Hyperlipidemia, Cancer    Examination-Activity Limitations  Carry;Stand;Stairs;Locomotion Level;Bend;Lift;Squat    Examination-Participation Restrictions  Cleaning;Community Activity;Laundry    Stability/Clinical Decision Making  Stable/Uncomplicated    Rehab Potential  Good    PT Frequency  2x / week    PT Duration  6 weeks    PT Treatment/Interventions  ADLs/Self Care Home Management;Moist Heat;Functional mobility training;Therapeutic activities;Therapeutic exercise;Patient/family education;Neuromuscular re-education;Balance training;Stair training;Manual techniques;Passive range of motion;Joint Manipulations    PT Next Visit Plan  Progress extension program and HEP    PT Home Exercise Plan  See education section.    Consulted and Agree with Plan of Care  Patient       Patient will benefit from skilled therapeutic  intervention in order to improve the following deficits and impairments:  Decreased balance, Decreased endurance, Decreased mobility, Difficulty walking, Hypomobility, Increased muscle spasms, Impaired sensation, Decreased range of motion, Improper body mechanics, Pain, Impaired flexibility, Decreased strength, Decreased activity tolerance  Visit Diagnosis: No diagnosis found.     Problem List Patient Active Problem List   Diagnosis Date Noted  . Recurrent UTI 12/27/2017  . Renal cyst 12/27/2017  . Rotator cuff tear, right 10/02/2017  . Complete rotator cuff tear 10/02/2017  . Essential hypertension 07/31/2016  . Renal artery stenosis ([place]) 07/31/2016  . DJD (degenerative joint disease) 07/31/2016  . Distal radius fracture, right 06/30/2015  . S/P left TKA 08/11/2011    Blythe Stanford, PT DPT 09/21/2018, 4:39 PM  [facility] [address] [place], [address] Phone: [phone]   Fax:  [phone]  Name: Anita Bailey MRN: [id number] Date of Birth: [birth year]

## 2018-09-23 ENCOUNTER — Ambulatory Visit: Payer: Medicare Other

## 2018-09-23 ENCOUNTER — Other Ambulatory Visit: Payer: Self-pay

## 2018-09-23 DIAGNOSIS — M545 Low back pain, unspecified: Secondary | ICD-10-CM

## 2018-09-23 DIAGNOSIS — G8929 Other chronic pain: Secondary | ICD-10-CM

## 2018-09-23 DIAGNOSIS — M6281 Muscle weakness (generalized): Secondary | ICD-10-CM

## 2018-09-23 NOTE — Therapy (Signed)
[facility] [address], [place], [address] Phone: [phone]   Fax:  [phone]  Physical Therapy Treatment  Patient Details  Name: Anita Bailey MRN: [id number] Date of Birth: 1934/04/22 Referring Provider (PT): Vladimir Crofts, MD   Encounter Date: 09/23/2018  PT End of Session - 09/23/18 1807    Visit Number  8    Number of Visits  13    Date for PT Re-Evaluation  10/06/18    PT Start Time  [id number]    PT Stop Time  1800    PT Time Calculation (min)  45 min    Activity Tolerance  Patient tolerated treatment well;No increased pain    Behavior During Therapy  WFL for tasks assessed/performed       Past Medical History:  Diagnosis Date  . Anemia   . Arthritis   . Cancer ([place])    hx of melanoma in 1971, basal cell   . Cervical disc disease   . Chronic kidney disease    current uti 08/05/11   . Depression   . DVT (deep venous thrombosis) ([place]) 01/2015   was Eliquis until 06/13/15-   . Dysrhythmia    hx of heart racing - in past, "none  in years".  had a stress test > 7 years  . History of recurrent UTIs    "bladder infection"  Had a PICC line in past  . Hyperlipidemia   . Hypertension   . Melanoma ([place])   . Nephrolithiasis   . Pulmonary emboli ([facility])    bilateral 8/12 hospitalized   . Vitamin D deficiency     Past Surgical History:  Procedure Laterality Date  . ABDOMINAL HYSTERECTOMY     1981  . BACK SURGERY    . BREAST EXCISIONAL BIOPSY Right 1979   Negative  . COLONOSCOPY W/ POLYPECTOMY    . COLONOSCOPY WITH PROPOFOL N/A 04/29/2017   Procedure: COLONOSCOPY WITH PROPOFOL;  Surgeon: [name], MD;  Location: [facility] ENDOSCOPY;  Service: Endoscopy;  Laterality: N/A;  . JOINT REPLACEMENT     bilateral knee  . left wrist      surgery x 2   . OPEN REDUCTION INTERNAL FIXATION (ORIF) DISTAL RADIAL FRACTURE Right 06/30/2015   Procedure: OPEN REDUCTION INTERNAL FIXATION (ORIF) RIGHT DISTAL  RADIUS FRACTURE ;  Surgeon: [name], MD;  Location: [place];  Service: Orthopedics;  Laterality: Right;  . OTHER SURGICAL HISTORY Right    leg - melonoma  . SHOULDER ARTHROSCOPY WITH OPEN ROTATOR CUFF REPAIR Right 10/02/2017   Procedure: Right shoulder mini open rotator cuff repair;  Surgeon: Susa Day, MD;  Location: [facility];  Service: Orthopedics;  Laterality: Right;  90 mins  . TONSILLECTOMY    . TOTAL KNEE ARTHROPLASTY  08/11/2011   Procedure: TOTAL KNEE ARTHROPLASTY;  Surgeon: [name], MD;  Location: [facility];  Service: Orthopedics;  Laterality: Left;  . TOTAL KNEE ARTHROPLASTY Left 2010    There were no vitals filed for this visit.  Subjective Assessment - 09/23/18 1756    Subjective  Patient reports additional increase in back pain following loading dishwasher since last therapy session and is seeking another epidural.    Pertinent History  HTN, Hyperlipidemia, Cancer, 2006 lumbar decompression for nerve pain    Limitations  Sitting;Lifting;Standing;Walking;House hold activities    Patient Stated Goals  Improve endurance and strength, reduce pain    Currently in Pain?  Yes  Pain Score  8     Pain Location  Back    Pain Orientation  Lower    Pain Descriptors / Indicators  Aching    Pain Type  Chronic pain    Pain Onset  More than a month ago    Pain Frequency  Intermittent    Multiple Pain Sites  No       TREATMENT  Manual Therapy Multiple trigger points through lumbar and thoracic paraspinals 3+ TTP released via ischemic impression Myofascial release and soft tissue mobilization to lumbar paraspinals, QL, left obliques to Joint mobilizations CPAs T7-T12, L2-L4  Manual therapy to increase tissue extensibility, joint mobility for increased thoracic and lumbar extension to reduce stiffness and pain and stress on symptomatic disc  Therapeutic Exercise Prone on elbows x 10 with concurrent extension mobilization to increase extension of thoracic spine Prone  press-ups x 10 to increase extension of thoracic spine CKC modified quadruped lumbar extension x 10 with therapist cues for target muscle relaxation CKC modified quadruped lumbar extension with alternating LE extension x 10 - provoked pain with R hip extension. Pain reduced with therapist manual inhibition of R QL Bilateral external rotation x 10 - therapist instruction and tactile cues for target muscle activation            PT Education - 09/23/18 1801    Education Details  form/techniques with exercise    Person(s) Educated  Patient    Methods  Explanation;Demonstration    Comprehension  Returned demonstration;Verbalized understanding       PT Short Term Goals - 08/25/18 1522      PT SHORT TERM GOAL #1   Title  Pt will be compliant and independent with HEP to continue benefits of therapy after discharge.    Time  2    Period  Weeks    Status  New    Target Date  09/08/18        PT Long Term Goals - 08/25/18 1523      PT LONG TERM GOAL #1   Title  Pt will score at least a 16% on the Modified Oswestry questionnaire, to demonstrate a significantly improved function and reduction in disability.    Baseline  26%    Time  6    Period  Weeks    Status  New    Target Date  10/06/18      PT LONG TERM GOAL #2   Title  Pt will report a consistent at-worst pain score of 5/10 for one week, to indicate a significantly improved pain response with mobility.    Baseline  7-8/10    Time  6    Period  Weeks    Status  New    Target Date  10/06/18      PT LONG TERM GOAL #3   Title  Pt will be able to perform a deep squat to pick up 20# with pain-free and good technique to improve ability to lift heavy boxes around her house.    Baseline  Able to perform squat to pick up 10# with heavy cueing on technique required.    Time  6    Period  Weeks    Status  New    Target Date  10/06/18            Plan - 09/23/18 1808    Clinical Impression Statement  Patient reports  pain relief with functional activity following trigger point release. Demonstrates good comprehension of exercises  and reduced lumbar hinging following joint mobilizations. Plan to incorporate dry needling into treatment plan for reduction in tissue tension through paraspinals and improved tolerance of extension exercises. Patient will benefit from additional skilled physical therapy to reduce low back pain and return to PLOF.    Personal Factors and Comorbidities  Comorbidity 1;Comorbidity 2;Comorbidity 3+;Age    Comorbidities  HTN, Hyperlipidemia, Cancer    Examination-Activity Limitations  Carry;Stand;Stairs;Locomotion Level;Bend;Lift;Squat    Examination-Participation Restrictions  Cleaning;Community Activity;Laundry    Stability/Clinical Decision Making  Stable/Uncomplicated    Rehab Potential  Good    PT Frequency  2x / week    PT Duration  6 weeks    PT Treatment/Interventions  ADLs/Self Care Home Management;Moist Heat;Functional mobility training;Therapeutic activities;Therapeutic exercise;Patient/family education;Neuromuscular re-education;Balance training;Stair training;Manual techniques;Passive range of motion;Joint Manipulations    PT Next Visit Plan  Progress extension program and HEP    PT Home Exercise Plan  See education section.    Consulted and Agree with Plan of Care  Patient       Patient will benefit from skilled therapeutic intervention in order to improve the following deficits and impairments:  Decreased balance, Decreased endurance, Decreased mobility, Difficulty walking, Hypomobility, Increased muscle spasms, Impaired sensation, Decreased range of motion, Improper body mechanics, Pain, Impaired flexibility, Decreased strength, Decreased activity tolerance  Visit Diagnosis: Chronic right-sided low back pain without sciatica  Muscle weakness (generalized)     Problem List Patient Active Problem List   Diagnosis Date Noted  . Recurrent UTI 12/27/2017  . Renal cyst  12/27/2017  . Rotator cuff tear, right 10/02/2017  . Complete rotator cuff tear 10/02/2017  . Essential hypertension 07/31/2016  . Renal artery stenosis ([place]) 07/31/2016  . DJD (degenerative joint disease) 07/31/2016  . Distal radius fracture, right 06/30/2015  . S/P left TKA 08/11/2011    Blythe Stanford 09/23/2018, 6:21 PM  [facility] [address], [place], [address] Phone: [phone]   Fax:  [phone]  Name: Anita Bailey MRN: [id number] Date of Birth: 1934/11/05

## 2018-09-24 ENCOUNTER — Other Ambulatory Visit: Payer: Self-pay | Admitting: Orthopedic Surgery

## 2018-09-24 DIAGNOSIS — M5136 Other intervertebral disc degeneration, lumbar region: Secondary | ICD-10-CM

## 2018-09-29 ENCOUNTER — Other Ambulatory Visit: Payer: Self-pay

## 2018-09-29 ENCOUNTER — Ambulatory Visit: Payer: Medicare Other

## 2018-09-30 ENCOUNTER — Ambulatory Visit
Admission: RE | Admit: 2018-09-30 | Discharge: 2018-09-30 | Disposition: A | Discharge status: Home / Self Care | Payer: Medicare Other | Source: Ambulatory Visit | Attending: Orthopedic Surgery | Admitting: Orthopedic Surgery

## 2018-09-30 DIAGNOSIS — M5136 Other intervertebral disc degeneration, lumbar region: Secondary | ICD-10-CM

## 2018-09-30 DIAGNOSIS — I471 Supraventricular tachycardia: Secondary | ICD-10-CM | POA: Insufficient documentation

## 2018-09-30 MED ORDER — IOPAMIDOL (ISOVUE-M 200) INJECTION 41%
1.0000 mL | Freq: Once | INTRAMUSCULAR | Status: AC
  Administered 2018-09-30: 1 mL via EPIDURAL

## 2018-09-30 MED ORDER — METHYLPREDNISOLONE ACETATE 40 MG/ML INJ SUSP (RADIOLOG
120.0000 mg | Freq: Once | INTRAMUSCULAR | Status: AC
  Administered 2018-09-30: 120 mg via EPIDURAL

## 2018-10-04 ENCOUNTER — Ambulatory Visit: Payer: Medicare Other

## 2018-10-05 ENCOUNTER — Ambulatory Visit: Payer: Medicare Other

## 2018-10-05 ENCOUNTER — Other Ambulatory Visit: Payer: Self-pay

## 2018-10-05 DIAGNOSIS — M6281 Muscle weakness (generalized): Secondary | ICD-10-CM

## 2018-10-05 DIAGNOSIS — M545 Low back pain, unspecified: Secondary | ICD-10-CM

## 2018-10-05 DIAGNOSIS — G8929 Other chronic pain: Secondary | ICD-10-CM

## 2018-10-05 NOTE — Therapy (Signed)
[facility] [address], [place], [address] Phone: [phone]   Fax:  [phone]  Physical Therapy Treatment  Patient Details  Name: Anita Bailey MRN: [id number] Date of Birth: May 04, 1934 Referring Provider (PT): Vladimir Crofts, MD   Encounter Date: 10/05/2018  PT End of Session - 10/06/18 1044    Visit Number  9    Number of Visits  13    Date for PT Re-Evaluation  10/06/18    PT Start Time  0950    PT Stop Time  1030    PT Time Calculation (min)  40 min    Activity Tolerance  Patient tolerated treatment well;No increased pain    Behavior During Therapy  WFL for tasks assessed/performed       Past Medical History:  Diagnosis Date  . Anemia   . Arthritis   . Cancer ([place])    hx of melanoma in 1971, basal cell   . Cervical disc disease   . Chronic kidney disease    current uti 08/05/11   . Depression   . DVT (deep venous thrombosis) ([place]) 01/2015   was Eliquis until 06/13/15-   . Dysrhythmia    hx of heart racing - in past, "none  in years".  had a stress test > 7 years  . History of recurrent UTIs    "bladder infection"  Had a PICC line in past  . Hyperlipidemia   . Hypertension   . Melanoma ([place])   . Nephrolithiasis   . Pulmonary emboli ([facility])    bilateral 8/12 hospitalized   . Vitamin D deficiency     Past Surgical History:  Procedure Laterality Date  . ABDOMINAL HYSTERECTOMY     1981  . BACK SURGERY    . BREAST EXCISIONAL BIOPSY Right 1979   Negative  . COLONOSCOPY W/ POLYPECTOMY    . COLONOSCOPY WITH PROPOFOL N/A 04/29/2017   Procedure: COLONOSCOPY WITH PROPOFOL;  Surgeon: [name], MD;  Location: [facility] ENDOSCOPY;  Service: Endoscopy;  Laterality: N/A;  . JOINT REPLACEMENT     bilateral knee  . left wrist      surgery x 2   . OPEN REDUCTION INTERNAL FIXATION (ORIF) DISTAL RADIAL FRACTURE Right 06/30/2015   Procedure: OPEN REDUCTION INTERNAL FIXATION (ORIF) RIGHT DISTAL  RADIUS FRACTURE ;  Surgeon: [name], MD;  Location: [facility];  Service: Orthopedics;  Laterality: Right;  . OTHER SURGICAL HISTORY Right    leg - melonoma  . SHOULDER ARTHROSCOPY WITH OPEN ROTATOR CUFF REPAIR Right 10/02/2017   Procedure: Right shoulder mini open rotator cuff repair;  Surgeon: Susa Day, MD;  Location: [facility];  Service: Orthopedics;  Laterality: Right;  90 mins  . TONSILLECTOMY    . TOTAL KNEE ARTHROPLASTY  08/11/2011   Procedure: TOTAL KNEE ARTHROPLASTY;  Surgeon: [name], MD;  Location: [facility];  Service: Orthopedics;  Laterality: Left;  . TOTAL KNEE ARTHROPLASTY Left 2010    There were no vitals filed for this visit.  Subjective Assessment - 10/06/18 1043    Subjective  Patient reports that back was limiting her during recent vacation. Presents to PT with 1x lofstrand crutch in L. Epidural was not as effective as previously. Considering surgery.    Pertinent History  HTN, Hyperlipidemia, Cancer, 2006 lumbar decompression for nerve pain    Limitations  Sitting;Lifting;Standing;Walking;House hold activities    Patient Stated Goals  Improve endurance and strength, reduce pain  Currently in Pain?  Yes    Pain Score  4     Pain Location  Back    Pain Orientation  Lower    Pain Descriptors / Indicators  Aching    Pain Type  Chronic pain    Pain Onset  More than a month ago    Pain Frequency  Intermittent       Reassessment: Pain with lifting 20#, no pain with 10# Worst pain 8/10 with activity  Manual therapy Trigger point release to thoracic paraspinals for improved tissue extensibility A-P mobilizations grade 1-2 T7-T10 30 glides each for improved extension through thoracic spine  Therapeutic exercise Mod quad PROM lumbar extension x 20 to reduce pressure on nerve radix and improve therapy tolerance Mod quad with LLE extended to open intervertebral space - patient reports pain after 5 reps, exercise discontinued Prone UE flexion, LE extension,  swimmers x 10 with verbal tactile cues for multifidi activation Ambulation x 300 ft - patient demonstrates reduced extensor activation of paraspinals  After session patient able to lift 20# without pain, stand without pain, and walk without pain, though reports feeling "wobbly". Discussion with pt regarding benefits of continued therapy vs. surgery.    PT Education - 10/06/18 1044    Education Details  form/technique with exercise; POC    Person(s) Educated  Patient    Methods  Explanation;Demonstration    Comprehension  Verbalized understanding;Returned demonstration       PT Short Term Goals - 08/25/18 1522      PT SHORT TERM GOAL #1   Title  Pt will be compliant and independent with HEP to continue benefits of therapy after discharge.    Time  2    Period  Weeks    Status  New    Target Date  09/08/18        PT Long Term Goals - 08/25/18 1523      PT LONG TERM GOAL #1   Title  Pt will score at least a 16% on the Modified Oswestry questionnaire, to demonstrate a significantly improved function and reduction in disability.    Baseline  26%    Time  6    Period  Weeks    Status  New    Target Date  10/06/18      PT LONG TERM GOAL #2   Title  Pt will report a consistent at-worst pain score of 5/10 for one week, to indicate a significantly improved pain response with mobility.    Baseline  7-8/10    Time  6    Period  Weeks    Status  New    Target Date  10/06/18      PT LONG TERM GOAL #3   Title  Pt will be able to perform a deep squat to pick up 20# with pain-free and good technique to improve ability to lift heavy boxes around her house.    Baseline  Able to perform squat to pick up 10# with heavy cueing on technique required.    Time  6    Period  Weeks    Status  New    Target Date  10/06/18            Plan - 10/05/18 1239    Clinical Impression Statement  Pain with squat, walking, and standing abolished with modified quad passive lumbar extension and  manual therapy. Following extensive education and repetitions patient able to perform exercise independently. Patient remains limited in  thoracic extension possibly increasing pressure on lumbar vertebrae and provoking symptoms. Patient will benefit from additional skilled therapy to reduce pain and restore PLOF.    Personal Factors and Comorbidities  Comorbidity 1;Comorbidity 2;Comorbidity 3+;Age    Comorbidities  HTN, Hyperlipidemia, Cancer    Examination-Activity Limitations  Carry;Stand;Stairs;Locomotion Level;Bend;Lift;Squat    Examination-Participation Restrictions  Cleaning;Community Activity;Laundry    Stability/Clinical Decision Making  Stable/Uncomplicated    Rehab Potential  Good    PT Frequency  2x / week    PT Duration  6 weeks    PT Treatment/Interventions  ADLs/Self Care Home Management;Moist Heat;Functional mobility training;Therapeutic activities;Therapeutic exercise;Patient/family education;Neuromuscular re-education;Balance training;Stair training;Manual techniques;Passive range of motion;Joint Manipulations    PT Next Visit Plan  Progress extension program and HEP    PT Home Exercise Plan  See education section.    Consulted and Agree with Plan of Care  Patient       Patient will benefit from skilled therapeutic intervention in order to improve the following deficits and impairments:  Decreased balance, Decreased endurance, Decreased mobility, Difficulty walking, Hypomobility, Increased muscle spasms, Impaired sensation, Decreased range of motion, Improper body mechanics, Pain, Impaired flexibility, Decreased strength, Decreased activity tolerance  Visit Diagnosis: Chronic right-sided low back pain without sciatica  Muscle weakness (generalized)     Problem List Patient Active Problem List   Diagnosis Date Noted  . PAT (paroxysmal atrial tachycardia) ([place]) 09/30/2018  . Adult idiopathic generalized osteoporosis 06/04/2018  . OSA on CPAP 03/29/2018  . Recurrent UTI  12/27/2017  . Renal cyst 12/27/2017  . Rotator cuff tear, right 10/02/2017  . Complete rotator cuff tear 10/02/2017  . Tubular adenoma 05/27/2017  . Spondylolisthesis 05/13/2017  . Spinal stenosis of lumbar region 05/13/2017  . Neuritis of saphenous nerve 05/13/2017  . Lumbar radiculopathy 05/13/2017  . Essential hypertension 07/31/2016  . Renal artery stenosis ([place]) 07/31/2016  . DJD (degenerative joint disease) 07/31/2016  . Low serum vitamin D 07/09/2015  . Distal radius fracture, right 06/30/2015  . Hyperlipidemia, mixed 04/10/2015  . History of malignant melanoma 02/13/2014  . Pulmonary emboli ([place]) 06/17/2013  . Pernicious anemia 06/17/2013  . Lumbar disc disease 06/17/2013  . Cervical disc disease 06/17/2013  . Basal cell carcinoma of right cheek 04/28/2013  . S/P left TKA 08/11/2011    Virgia Land, SPT 10/06/2018, 10:46 AM  [facility] [address], [place], [address] Phone: [phone]   Fax:  [phone]  Name: GRACIOUS LIEBENOW MRN: [id number] Date of Birth: 06-04-1934

## 2018-10-13 ENCOUNTER — Ambulatory Visit: Payer: Medicare Other | Attending: Neurology

## 2018-10-13 DIAGNOSIS — G4733 Obstructive sleep apnea (adult) (pediatric): Secondary | ICD-10-CM | POA: Insufficient documentation

## 2018-10-13 DIAGNOSIS — F5101 Primary insomnia: Secondary | ICD-10-CM | POA: Insufficient documentation

## 2018-10-14 ENCOUNTER — Other Ambulatory Visit: Payer: Self-pay

## 2018-10-18 ENCOUNTER — Ambulatory Visit: Payer: Medicare Other | Attending: Neurology

## 2018-10-20 ENCOUNTER — Ambulatory Visit: Payer: Medicare Other

## 2018-10-25 ENCOUNTER — Ambulatory Visit: Payer: Medicare Other

## 2018-10-27 ENCOUNTER — Ambulatory Visit: Payer: Medicare Other

## 2018-11-01 ENCOUNTER — Ambulatory Visit: Payer: Medicare Other

## 2018-11-03 ENCOUNTER — Ambulatory Visit: Payer: Medicare Other

## 2018-11-16 ENCOUNTER — Ambulatory Visit: Payer: Medicare Other

## 2018-11-18 ENCOUNTER — Ambulatory Visit: Payer: Medicare Other

## 2018-11-22 ENCOUNTER — Ambulatory Visit: Payer: Medicare Other | Attending: Specialist

## 2018-11-22 ENCOUNTER — Other Ambulatory Visit: Payer: Self-pay

## 2018-11-22 DIAGNOSIS — G8929 Other chronic pain: Secondary | ICD-10-CM

## 2018-11-22 DIAGNOSIS — M6281 Muscle weakness (generalized): Secondary | ICD-10-CM | POA: Insufficient documentation

## 2018-11-22 DIAGNOSIS — M545 Low back pain, unspecified: Secondary | ICD-10-CM

## 2018-11-22 NOTE — Therapy (Signed)
[facility] [address] [place], [address] Phone: [phone]   Fax:  [phone]  Physical Therapy Treatment  Patient Details  Name: Anita Bailey MRN: [id number] Date of Birth: [birth year] Referring Provider (PT): Vladimir Crofts, MD   Encounter Date: 11/22/2018  PT End of Session - 11/23/18 0957    Visit Number  1    Number of Visits  13    Date for PT Re-Evaluation  01/04/19    Authorization Type  0 / 10    PT Start Time  1606    PT Stop Time  1700    PT Time Calculation (min)  54 min    Activity Tolerance  Patient tolerated treatment well;No increased pain    Behavior During Therapy  WFL for tasks assessed/performed       Past Medical History:  Diagnosis Date  . Anemia   . Arthritis   . Cancer ([place])    hx of melanoma in 1971, basal cell   . Cervical disc disease   . Chronic kidney disease    current uti 08/05/11   . Depression   . DVT (deep venous thrombosis) ([place]) 01/2015   was Eliquis until 06/13/15-   . Dysrhythmia    hx of heart racing - in past, "none  in years".  had a stress test > 7 years  . History of recurrent UTIs    "bladder infection"  Had a PICC line in past  . Hyperlipidemia   . Hypertension   . Melanoma ([place])   . Nephrolithiasis   . Pulmonary emboli ([facility])    bilateral 8/12 hospitalized   . Vitamin D deficiency     Past Surgical History:  Procedure Laterality Date  . ABDOMINAL HYSTERECTOMY     1981  . BACK SURGERY    . BREAST EXCISIONAL BIOPSY Right 1979   Negative  . COLONOSCOPY W/ POLYPECTOMY    . COLONOSCOPY WITH PROPOFOL N/A 04/29/2017   Procedure: COLONOSCOPY WITH PROPOFOL;  Surgeon: [name], MD;  Location: [facility] ENDOSCOPY;  Service: Endoscopy;  Laterality: N/A;  . JOINT REPLACEMENT     bilateral knee  . left wrist      surgery x 2   . OPEN REDUCTION INTERNAL FIXATION (ORIF) DISTAL RADIAL FRACTURE Right 06/30/2015   Procedure: OPEN REDUCTION INTERNAL  FIXATION (ORIF) RIGHT DISTAL RADIUS FRACTURE ;  Surgeon: [name], MD;  Location: [facility];  Service: Orthopedics;  Laterality: Right;  . OTHER SURGICAL HISTORY Right    leg - melonoma  . SHOULDER ARTHROSCOPY WITH OPEN ROTATOR CUFF REPAIR Right 10/02/2017   Procedure: Right shoulder mini open rotator cuff repair;  Surgeon: Susa Day, MD;  Location: [facility];  Service: Orthopedics;  Laterality: Right;  90 mins  . TONSILLECTOMY    . TOTAL KNEE ARTHROPLASTY  08/11/2011   Procedure: TOTAL KNEE ARTHROPLASTY;  Surgeon: [name], MD;  Location: [facility];  Service: Orthopedics;  Laterality: Left;  . TOTAL KNEE ARTHROPLASTY Left 2010    There were no vitals filed for this visit.  Subjective Assessment - 11/22/18 1611    Subjective  Patient is a pleasant 84 yo female presenting to PT for low back pain. Patient is known to this therapist for prior episode of same, d/c following extended absence pending COVID-19 testing and medical procedures. Patient is retired and enjoys playing cards and light yard work.    Pertinent History  Pain onset 4-5 years ago  insidiously. Patient has received multiple epidurals for managing the pain with good results. Patient reports MRI positive for pinched nerve L3-4 and L5-S1. Attended physical therapy 9 visits with some improvement. Agg: flexion activities, sit <> stand, transfers. Ease: moving out of agg position; moving slowly and carefully. Reports HTN, Hyperlipidemia, Cancer (melanoma dx 1970), 2006 lumbar decompression for nerve pain    Limitations  Lifting;House hold activities    How long can you sit comfortably?  indefinitely but pain during transfer    How long can you stand comfortably?  indefinitely    How long can you walk comfortably?  indefinitely    Diagnostic tests  MRI positive as described above    Patient Stated Goals  Improve core strength; reduce pain    Currently in Pain?  Yes    Pain Score  5     Pain Location  Back    Pain Orientation   Lower;Right    Pain Descriptors / Indicators  Aching    Pain Type  Chronic pain    Pain Radiating Towards  R buttock    Pain Onset  More than a month ago    Pain Frequency  Intermittent    Aggravating Factors   Repeated flexion activities including household activities    Pain Relieving Factors  Exercises    Multiple Pain Sites  No        SUBJ Patient returns to PT following covid test. Patient reports more trouble with back following a procedure. Reports some pain in "a tendon along the side of my leg" that causes knee to buckle when bumped. Received lumbar brace but she did not like it and does not wear it. Bought knee brace. Reports cessation of radicular pain following epidural. Pain onset ~4-5 yr ago.    PMH/Prior episodes: Prior therapy for same issue, d/c due to nonattendance. Imaging: MRI positive for pinched nerve L34 L5S1 Meds/Medical mgmt: epidurals. Pt reports relief with last epidural Precautions:   PAIN Location: back  5/10 current 10/10 worst 5/10 best  Irritability: low Nature: MSK Stage: chronic Stability: ISQ  Agg: flexion, sit <> stand, rising from squat, bending Ease: move out of agg position  SOCIAL HX Live:  (Stairs):  Work: retired Systems analyst: plays cards, yardwork   ADLs and MOBILITY Sit tol: indefinitely Stand tol: indefinitely Walk tol: indefinitely Sleep: wake from sleep but can return without issue Stairs: slow, easier down Falls: none  Red flags Wt loss: none B/B:  no Night pain: no CA: melanoma 1970 Saddle paresthesia: no  GOALS: to have no pain, to get my core stronger   POSTURE Standing: hyperlordosis mild  GAIT / MOBILITY / TRANSFERS Gait: Trendelenberg B, narrow step width Stairs: with rail WNL Bed mobility: mod I with extra time needed - patient is careful to minimize lumbar rotation and flexion during transfers   BALANCE SLS: Over R 10 sec Over L 6 sec  PROM / AROM / MMT  Lumbar ROM     Flex 75%!  Ext. WNL   Side-bend R WNL  Side-bend L WNL  ROT R WNL  ROT L WNL  ! = Painful   Thoracic ROM     Flex WNL  Ext. WNL  L ROT WNL  R ROT WNL  ! = Painful     HIP ROM  MMT    R L R L  Flex WNL WNL 4- 4  Ext. WNL WNL 3+ 3+  ABD WNL WNL 3 3  ADD  4 4  ER WNL WNL    IR WNL WNL    ! = Painful  Knee   Grossly 5/5 full ROM   ANKLE ROM  MMT    R L R L  DF WNL WNL 4+ 5  PF WNL WNL 4+ 5  ! = Painful    SOFT TISSUE LENGTH Hamstrings: WNL  PAM CPA: mild hypomobility T5-T11  NEURO Sensation: apparently normal    SPECIAL TESTS - Supine sign - SLR - XSLR  - Slump - Thomas test  PALPATION Paraspinals: note increased tone R/L  OUTCOME MEASURES  TEST SCORE INTERPRETATION  Oswestry 26% Moderate disability    TREATMENT Therapeutic Exercise Standing lumbar extension -- x 20 with UE support DKTC in pike position -- x 10 with performing exercise  Performed exercises to improve lumbar stabilization and AROM most notably extension in standing    PT Short Term Goals - 11/23/18 1006      PT SHORT TERM GOAL #1   Title  Patient will demonstrate adherence to HEP 3x/wk as adjunct to clinical therapy to speed recovery and reduce total number of visits.    Baseline  HEP given    Time  2    Period  Weeks    Status  New    Target Date  12/07/18        PT Long Term Goals - 11/23/18 1008      PT LONG TERM GOAL #1   Title  Patient will reduce score on MODI by 10% to achieve MCID and demonstrate reduced disability.    Baseline  26%    Time  6    Period  Weeks    Status  New    Target Date  01/04/19      PT LONG TERM GOAL #2   Title  Patient will report no increased pain with meal prep activities to indicate improved pain response with repeated flexion.    Baseline  9-10/10    Time  6    Period  Weeks    Status  New    Target Date  01/04/19      PT LONG TERM GOAL #3   Title  Patient will report ability to perform 5 sit <> stand transfers without increase in  pain and with good technique to demonstrate reduced pain with transfers.    Baseline  Hip adduction/UE assist with sit <> stand; reports increase in pain    Time  6    Period  Weeks    Status  New    Target Date  01/04/19            Plan - 11/23/18 [id number]    Clinical Impression Statement  Patient is a pleasant 84 yo female known to therapist for prior episode of therapy for same issue, d/c after prolonged absence for medical reasons. Screen for red flags negative. Physical examination significant for mild postural abnormalities, gluteal weakness, and aberrant movements with gait and transfers. Neurodynamic tests negative. Clinical Impression: mechanical LBP of mild severity secondary to stability deficits in lumbar spine and hip girdle. Patient will benefit from physical therapy to reduce pain with ADLs and return to PLOF.    Personal Factors and Comorbidities  Comorbidity 1;Comorbidity 2;Comorbidity 3+;Age    Comorbidities  HTN, Hyperlipidemia, Cancer    Examination-Activity Limitations  Carry;Stand;Stairs;Locomotion Level;Bend;Lift;Squat;Sit    Examination-Participation Restrictions  Cleaning;Laundry;Meal Prep    Stability/Clinical Decision Making  Stable/Uncomplicated    Clinical Decision Making  Low  Rehab Potential  Good    PT Frequency  2x / week    PT Duration  6 weeks    PT Treatment/Interventions  ADLs/Self Care Home Management;Moist Heat;Functional mobility training;Therapeutic activities;Therapeutic exercise;Patient/family education;Neuromuscular re-education;Balance training;Stair training;Manual techniques;Passive range of motion;Joint Manipulations;Taping;Dry needling;Electrical Stimulation;Cryotherapy    PT Next Visit Plan  Initiate extension program    PT Home Exercise Plan  Standing lumbar ext    Consulted and Agree with Plan of Care  Patient       Patient will benefit from skilled therapeutic intervention in order to improve the following deficits and impairments:   Decreased balance, Decreased endurance, Decreased mobility, Difficulty walking, Hypomobility, Increased muscle spasms, Impaired sensation, Decreased range of motion, Improper body mechanics, Pain, Impaired flexibility, Decreased strength, Decreased activity tolerance, Abnormal gait  Visit Diagnosis: Chronic right-sided low back pain without sciatica  Muscle weakness (generalized)     Problem List Patient Active Problem List   Diagnosis Date Noted  . PAT (paroxysmal atrial tachycardia) ([place]) 09/30/2018  . Adult idiopathic generalized osteoporosis 06/04/2018  . OSA on CPAP 03/29/2018  . Recurrent UTI 12/27/2017  . Renal cyst 12/27/2017  . Rotator cuff tear, right 10/02/2017  . Complete rotator cuff tear 10/02/2017  . Tubular adenoma 05/27/2017  . Spondylolisthesis 05/13/2017  . Spinal stenosis of lumbar region 05/13/2017  . Neuritis of saphenous nerve 05/13/2017  . Lumbar radiculopathy 05/13/2017  . Essential hypertension 07/31/2016  . Renal artery stenosis ([place]) 07/31/2016  . DJD (degenerative joint disease) 07/31/2016  . Low serum vitamin D 07/09/2015  . Distal radius fracture, right 06/30/2015  . Hyperlipidemia, mixed 04/10/2015  . History of malignant melanoma 02/13/2014  . Pulmonary emboli ([place]) 06/17/2013  . Pernicious anemia 06/17/2013  . Lumbar disc disease 06/17/2013  . Cervical disc disease 06/17/2013  . Basal cell carcinoma of right cheek 04/28/2013  . S/P left TKA 08/11/2011    Virgia Land, SPT 11/23/2018, 10:14 AM  [facility] [address] [place], [address] Phone: [phone]   Fax:  [phone]  Name: KITRINA SCHUBEL MRN: [id number] Date of Birth: December 16, 1934

## 2018-11-24 ENCOUNTER — Other Ambulatory Visit: Payer: Self-pay

## 2018-11-24 ENCOUNTER — Ambulatory Visit: Payer: Medicare Other

## 2018-11-24 DIAGNOSIS — M6281 Muscle weakness (generalized): Secondary | ICD-10-CM

## 2018-11-24 DIAGNOSIS — G8929 Other chronic pain: Secondary | ICD-10-CM

## 2018-11-24 DIAGNOSIS — M545 Low back pain, unspecified: Secondary | ICD-10-CM

## 2018-11-24 NOTE — Therapy (Signed)
[facility] [address] [place], [address] Phone: [phone]   Fax:  [phone]  Physical Therapy Treatment  Patient Details  Name: Anita Bailey MRN: [id number] Date of Birth: 1934/04/07 Referring Provider (PT): Vladimir Crofts, MD   Encounter Date: 11/24/2018    Past Medical History:  Diagnosis Date  . Anemia   . Arthritis   . Cancer ([place])    hx of melanoma in 1971, basal cell   . Cervical disc disease   . Chronic kidney disease    current uti 08/05/11   . Depression   . DVT (deep venous thrombosis) ([place]) 01/2015   was Eliquis until 06/13/15-   . Dysrhythmia    hx of heart racing - in past, "none  in years".  had a stress test > 7 years  . History of recurrent UTIs    "bladder infection"  Had a PICC line in past  . Hyperlipidemia   . Hypertension   . Melanoma ([place])   . Nephrolithiasis   . Pulmonary emboli ([facility])    bilateral 8/12 hospitalized   . Vitamin D deficiency     Past Surgical History:  Procedure Laterality Date  . ABDOMINAL HYSTERECTOMY     1981  . BACK SURGERY    . BREAST EXCISIONAL BIOPSY Right 1979   Negative  . COLONOSCOPY W/ POLYPECTOMY    . COLONOSCOPY WITH PROPOFOL N/A 04/29/2017   Procedure: COLONOSCOPY WITH PROPOFOL;  Surgeon: [name], MD;  [facility] END[facility]COPY;  Service: Endoscopy;  Laterality: N/A;  . JOINT REPLACEMENT     bilateral knee  . left wrist      surgery x 2   . OPEN REDUCTION INTERNAL FIXATION (ORIF) DISTAL RADIAL FRACTURE Right 06/30/2015   Procedure: OPEN REDUCTION INTERNAL FIXATION (ORIF) RIGHT DISTAL RADIUS FRACTURE ;  Surgeon: [name], MD;  Location: [facility];  Service: Orthopedics;  Laterality: Right;  . OTHER SURGICAL HISTORY Right    leg - melonoma  . SHOULDER ARTHROSCOPY WITH OPEN ROTATOR CUFF REPAIR Right 10/02/2017   Procedure: Right shoulder mini open rotator cuff repair;  Surgeon: Susa Day, MD;  Location: [facility];   Service: Orthopedics;  Laterality: Right;  90 mins  . TONSILLECTOMY    . TOTAL KNEE ARTHROPLASTY  08/11/2011   Procedure: TOTAL KNEE ARTHROPLASTY;  Surgeon: [name], MD;  Location: [facility];  Service: Orthopedics;  Laterality: Left;  . TOTAL KNEE ARTHROPLASTY Left 2010    There were no vitals filed for this visit.  Subjective Assessment - 11/24/18 1040    Subjective  Patient reports feeling good with some back pain yesterday following yardwork but resolved somewhat.    Pertinent History  Pain onset 4-5 years ago insidiously. Patient has received multiple epidurals for managing the pain with good results. Patient reports MRI positive for pinched nerve L3-4 and L5-S1. Attended physical therapy 9 visits with some improvement. Agg: flexion activities, sit <> stand, transfers. Ease: moving out of agg position; moving slowly and carefully. Reports HTN, Hyperlipidemia, Cancer (melanoma dx 1970), 2006 lumbar decompression for nerve pain    Limitations  Lifting;House hold activities    How long can you sit comfortably?  indefinitely but pain during transfer    How long can you stand comfortably?  indefinitely    How long can you walk comfortably?  indefinitely    Diagnostic tests  MRI positive as described above    Patient Stated Goals  Improve  core strength; reduce pain    Currently in Pain?  No/denies    Pain Onset  More than a month ago       TREATMENT   MT Myofascial release and trigger point release to lumbar paraspinals QL R>L x 5 min to calm irritated tissues, improve range of motion, decrease pain, and facilitate appropriate body mechanics.  TE  Prone Thoracic extension POE x 20 LTR over red ball x 20 R/L Supine PPT x 30 patient reports mild pain initially improved with repetition Bridges x 20 with cues for segmental movement focus on posterior pelvic tilt Supine marches with cues for TVA engagement x 20 alternating R/L STS 2 x 5 with cues for sequencing with ATP for reduced  strain on lumbar spine STS 1 x 5 with band around knees to cue gluteal activation - patient reports exercise is "easy" with "no pain" Band thoracic rotation with green theraband R/L in standing x 20   Added STS to HEP        PT Education - 11/24/18 1038    Education Details  form/technique with exercise    Person(s) Educated  Patient    Methods  Explanation;Demonstration;Tactile cues;Verbal cues    Comprehension  Verbalized understanding;Returned demonstration;Verbal cues required       PT Short Term Goals - 11/23/18 1006      PT SHORT TERM GOAL #1   Title  Patient will demonstrate adherence to HEP 3x/wk as adjunct to clinical therapy to speed recovery and reduce total number of visits.    Baseline  HEP given    Time  2    Period  Weeks    Status  New    Target Date  12/07/18        PT Long Term Goals - 11/23/18 1008      PT LONG TERM GOAL #1   Title  Patient will reduce score on MODI by 10% to achieve MCID and demonstrate reduced disability.    Baseline  26%    Time  6    Period  Weeks    Status  New    Target Date  01/04/19      PT LONG TERM GOAL #2   Title  Patient will report no increased pain with meal prep activities to indicate improved pain response with repeated flexion.    Baseline  9-10/10    Time  6    Period  Weeks    Status  New    Target Date  01/04/19      PT LONG TERM GOAL #3   Title  Patient will report ability to perform 5 sit <> stand transfers without increase in pain and with good technique to demonstrate reduced pain with transfers.    Baseline  Hip adduction/UE assist with sit <> stand; reports increase in pain    Time  6    Period  Weeks    Status  New    Target Date  01/04/19              Patient will benefit from skilled therapeutic intervention in order to improve the following deficits and impairments:     Visit Diagnosis: No diagnosis found.     Problem List Patient Active Problem List   Diagnosis Date Noted   . PAT (paroxysmal atrial tachycardia) ([place]) 09/30/2018  . Adult idiopathic generalized osteoporosis 06/04/2018  . OSA on CPAP 03/29/2018  . Recurrent UTI 12/27/2017  . Renal cyst 12/27/2017  . Rotator  cuff tear, right 10/02/2017  . Complete rotator cuff tear 10/02/2017  . Tubular adenoma 05/27/2017  . Spondylolisthesis 05/13/2017  . Spinal stenosis of lumbar region 05/13/2017  . Neuritis of saphenous nerve 05/13/2017  . Lumbar radiculopathy 05/13/2017  . Essential hypertension 07/31/2016  . Renal artery stenosis ([place]) 07/31/2016  . DJD (degenerative joint disease) 07/31/2016  . Low serum vitamin D 07/09/2015  . Distal radius fracture, right 06/30/2015  . Hyperlipidemia, mixed 04/10/2015  . History of malignant melanoma 02/13/2014  . Pulmonary emboli ([place]) 06/17/2013  . Pernicious anemia 06/17/2013  . Lumbar disc disease 06/17/2013  . Cervical disc disease 06/17/2013  . Basal cell carcinoma of right cheek 04/28/2013  . S/P left TKA 08/11/2011    Virgia Land, SPT 11/24/2018, 10:46 AM  [facility] [address] [place], [address] Phone: [phone]   Fax:  [phone]  Name: Anita Bailey MRN: [id number] Date of Birth: 05-01-1934

## 2018-11-29 ENCOUNTER — Other Ambulatory Visit: Payer: Self-pay

## 2018-11-29 ENCOUNTER — Ambulatory Visit: Payer: Medicare Other

## 2018-11-29 DIAGNOSIS — M545 Low back pain, unspecified: Secondary | ICD-10-CM

## 2018-11-29 DIAGNOSIS — M6281 Muscle weakness (generalized): Secondary | ICD-10-CM

## 2018-11-29 DIAGNOSIS — G8929 Other chronic pain: Secondary | ICD-10-CM

## 2018-11-29 NOTE — Therapy (Signed)
[facility] [address], [place], [address] Phone: [phone]   Fax:  [phone]  Physical Therapy Treatment  Patient Details  Name: Anita Bailey MRN: [id number] Date of Birth: 1934/08/22 Referring Provider (PT): Vladimir Crofts, MD   Encounter Date: 11/29/2018  PT End of Session - 11/29/18 1354    Visit Number  3    Number of Visits  13    Date for PT Re-Evaluation  01/04/19    Authorization Type  3 / 10    PT Start Time  [id number]    PT Stop Time  1432    PT Time Calculation (min)  39 min    Activity Tolerance  Patient tolerated treatment well;No increased pain    Behavior During Therapy  WFL for tasks assessed/performed       Past Medical History:  Diagnosis Date  . Anemia   . Arthritis   . Cancer ([place])    hx of melanoma in 1971, basal cell   . Cervical disc disease   . Chronic kidney disease    current uti 08/05/11   . Depression   . DVT (deep venous thrombosis) ([place]) 01/2015   was Eliquis until 06/13/15-   . Dysrhythmia    hx of heart racing - in past, "none  in years".  had a stress test > 7 years  . History of recurrent UTIs    "bladder infection"  Had a PICC line in past  . Hyperlipidemia   . Hypertension   . Melanoma ([place])   . Nephrolithiasis   . Pulmonary emboli ([facility])    bilateral 8/12 hospitalized   . Vitamin D deficiency     Past Surgical History:  Procedure Laterality Date  . ABDOMINAL HYSTERECTOMY     1981  . BACK SURGERY    . BREAST EXCISIONAL BIOPSY Right 1979   Negative  . COLONOSCOPY W/ POLYPECTOMY    . COLONOSCOPY WITH PROPOFOL N/A 04/29/2017   Procedure: COLONOSCOPY WITH PROPOFOL;  Surgeon: [name], MD;  Location: [facility];  Service: Endoscopy;  Laterality: N/A;  . JOINT REPLACEMENT     bilateral knee  . left wrist      surgery x 2   . OPEN REDUCTION INTERNAL FIXATION (ORIF) DISTAL RADIAL FRACTURE Right 06/30/2015   Procedure: OPEN REDUCTION INTERNAL  FIXATION (ORIF) RIGHT DISTAL RADIUS FRACTURE ;  Surgeon: [name], MD;  Location: [facility];  Service: Orthopedics;  Laterality: Right;  . OTHER SURGICAL HISTORY Right    leg - melonoma  . SHOULDER ARTHROSCOPY WITH OPEN ROTATOR CUFF REPAIR Right 10/02/2017   Procedure: Right shoulder mini open rotator cuff repair;  Surgeon: Susa Day, MD;  Location: [facility];  Service: Orthopedics;  Laterality: Right;  90 mins  . TONSILLECTOMY    . TOTAL KNEE ARTHROPLASTY  08/11/2011   Procedure: TOTAL KNEE ARTHROPLASTY;  Surgeon: [name], MD;  Location: [facility];  Service: Orthopedics;  Laterality: Left;  . TOTAL KNEE ARTHROPLASTY Left 2010    There were no vitals filed for this visit.  Subjective Assessment - 11/29/18 1353    Subjective  Patient reports birthday party over weekend. Reports to PT with Aspen back brace which she does not like and does not feel is useful.    Pertinent History  Pain onset 4-5 years ago insidiously. Patient has received multiple epidurals for managing the pain with good results. Patient reports MRI positive for pinched nerve L3-4 and  L5-S1. Attended physical therapy 9 visits with some improvement. Agg: flexion activities, sit <> stand, transfers. Ease: moving out of agg position; moving slowly and carefully. Reports HTN, Hyperlipidemia, Cancer (melanoma dx 1970), 2006 lumbar decompression for nerve pain    Limitations  Lifting;House hold activities    How long can you sit comfortably?  indefinitely but pain during transfer    How long can you stand comfortably?  indefinitely    How long can you walk comfortably?  indefinitely    Diagnostic tests  MRI positive as described above    Patient Stated Goals  Improve core strength; reduce pain    Currently in Pain?  Yes    Pain Score  2     Pain Location  Back    Pain Onset  More than a month ago       TREATMENT  TE LTRs over red ball x 20 R/L Hip circumduction over red ball x 20 cw Supine PPT x 20 patient reports  mild pain initially improved with repetition Segmental bridges x 20 Lumbar flexion stretch over ball in seated 20 x 5 sec hold Instruction in proper sequencing and form with deadlifts Deadlifts 4# from a raised surface with tactile verbal cues for improved hamstring activation and reduced lumbar flexion - patient able to complete 3 repetitions before demonstrating poor form and onset of LBP. Likely secondary to weakness/tightness in HS Lumbar extension PROM in standing x 10 with UE support to reduce LBP Bridges over step 2 x 10 for increased HS activation Instruction in thoracic rotation while supine to improve bed mobility Pelvic tilts on ball x30 Min A for balance  TE for rehabilitation of functional flexion patterns and core strength      PT Education - 11/29/18 1354    Education Details  form/technique with exercise    Person(s) Educated  Patient    Methods  Explanation;Demonstration;Tactile cues;Verbal cues    Comprehension  Verbalized understanding;Returned demonstration;Verbal cues required;Tactile cues required       PT Short Term Goals - 11/23/18 1006      PT SHORT TERM GOAL #1   Title  Patient will demonstrate adherence to HEP 3x/wk as adjunct to clinical therapy to speed recovery and reduce total number of visits.    Baseline  HEP given    Time  2    Period  Weeks    Status  New    Target Date  12/07/18        PT Long Term Goals - 11/23/18 1008      PT LONG TERM GOAL #1   Title  Patient will reduce score on MODI by 10% to achieve MCID and demonstrate reduced disability.    Baseline  26%    Time  6    Period  Weeks    Status  New    Target Date  01/04/19      PT LONG TERM GOAL #2   Title  Patient will report no increased pain with meal prep activities to indicate improved pain response with repeated flexion.    Baseline  9-10/10    Time  6    Period  Weeks    Status  New    Target Date  01/04/19      PT LONG TERM GOAL #3   Title  Patient will report  ability to perform 5 sit <> stand transfers without increase in pain and with good technique to demonstrate reduced pain with transfers.  Baseline  Hip adduction/UE assist with sit <> stand; reports increase in pain    Time  6    Period  Weeks    Status  New    Target Date  01/04/19            Plan - 11/29/18 1700    Clinical Impression Statement  Patient reports reduced LBP with functional activities and demonstrates good retention of sequencing and motor control of hip girde. Rehabilitiation of flexion patterns progressing with remaining deficits primarily in standing lumbar flexion activities due to LE and core weakness. Patient will continue to benefit form skilled physical therapy to reduce pain with ADLs and return to PLOF.    Personal Factors and Comorbidities  Comorbidity 1;Comorbidity 2;Comorbidity 3+;Age    Comorbidities  HTN, Hyperlipidemia, Cancer    Examination-Activity Limitations  Carry;Stand;Stairs;Locomotion Level;Bend;Lift;Squat;Sit    Examination-Participation Restrictions  Cleaning;Laundry;Meal Prep    Stability/Clinical Decision Making  Stable/Uncomplicated    Rehab Potential  Good    PT Frequency  2x / week    PT Duration  6 weeks    PT Treatment/Interventions  ADLs/Self Care Home Management;Moist Heat;Functional mobility training;Therapeutic activities;Therapeutic exercise;Patient/family education;Neuromuscular re-education;Balance training;Stair training;Manual techniques;Passive range of motion;Joint Manipulations;Taping;Dry needling;Electrical Stimulation;Cryotherapy    PT Next Visit Plan  Progres flexion therex    PT Home Exercise Plan  Standing lumbar ext    Consulted and Agree with Plan of Care  Patient       Patient will benefit from skilled therapeutic intervention in order to improve the following deficits and impairments:  Decreased balance, Decreased endurance, Decreased mobility, Difficulty walking, Hypomobility, Increased muscle spasms, Impaired  sensation, Decreased range of motion, Improper body mechanics, Pain, Impaired flexibility, Decreased strength, Decreased activity tolerance, Abnormal gait  Visit Diagnosis: Chronic right-sided low back pain without sciatica  Muscle weakness (generalized)     Problem List Patient Active Problem List   Diagnosis Date Noted  . PAT (paroxysmal atrial tachycardia) ([place]) 09/30/2018  . Adult idiopathic generalized osteoporosis 06/04/2018  . OSA on CPAP 03/29/2018  . Recurrent UTI 12/27/2017  . Renal cyst 12/27/2017  . Rotator cuff tear, right 10/02/2017  . Complete rotator cuff tear 10/02/2017  . Tubular adenoma 05/27/2017  . Spondylolisthesis 05/13/2017  . Spinal stenosis of lumbar region 05/13/2017  . Neuritis of saphenous nerve 05/13/2017  . Lumbar radiculopathy 05/13/2017  . Essential hypertension 07/31/2016  . Renal artery stenosis ([place]) 07/31/2016  . DJD (degenerative joint disease) 07/31/2016  . Low serum vitamin D 07/09/2015  . Distal radius fracture, right 06/30/2015  . Hyperlipidemia, mixed 04/10/2015  . History of malignant melanoma 02/13/2014  . Pulmonary emboli ([place]) 06/17/2013  . Pernicious anemia 06/17/2013  . Lumbar disc disease 06/17/2013  . Cervical disc disease 06/17/2013  . Basal cell carcinoma of right cheek 04/28/2013  . S/P left TKA 08/11/2011    Virgia Land, SPT 11/30/2018, 7:45 AM  [facility] [address], [place], [address] Phone: [phone]   Fax:  [phone]  Name: JENNA GRENIER MRN: [id number] Date of Birth: February 10, 1934

## 2018-12-01 ENCOUNTER — Ambulatory Visit: Payer: Medicare Other

## 2018-12-01 ENCOUNTER — Other Ambulatory Visit: Payer: Self-pay

## 2018-12-01 DIAGNOSIS — M545 Low back pain, unspecified: Secondary | ICD-10-CM

## 2018-12-01 DIAGNOSIS — G8929 Other chronic pain: Secondary | ICD-10-CM

## 2018-12-01 DIAGNOSIS — M6281 Muscle weakness (generalized): Secondary | ICD-10-CM

## 2018-12-01 NOTE — Therapy (Signed)
[facility] [address] [place], [address] Phone: [phone]   Fax:  [phone]  Physical Therapy Treatment  Patient Details  Name: Anita Bailey MRN: [id number] Date of Birth: 09-Dec-1934 Referring Provider (PT): Vladimir Crofts, MD   Encounter Date: 12/01/2018  PT End of Session - 12/01/18 1441    Visit Number  4    Number of Visits  13    Date for PT Re-Evaluation  01/04/19    Authorization Type  4 / 10    PT Start Time  1440    PT Stop Time  1518    PT Time Calculation (min)  38 min    Activity Tolerance  Patient tolerated treatment well;No increased pain    Behavior During Therapy  WFL for tasks assessed/performed       Past Medical History:  Diagnosis Date  . Anemia   . Arthritis   . Cancer ([place])    hx of melanoma in 1971, basal cell   . Cervical disc disease   . Chronic kidney disease    current uti 08/05/11   . Depression   . DVT (deep venous thrombosis) ([place]) 01/2015   was Eliquis until 06/13/15-   . Dysrhythmia    hx of heart racing - in past, "none  in years".  had a stress test > 7 years  . History of recurrent UTIs    "bladder infection"  Had a PICC line in past  . Hyperlipidemia   . Hypertension   . Melanoma ([place])   . Nephrolithiasis   . Pulmonary emboli ([facility])    bilateral 8/12 hospitalized   . Vitamin D deficiency     Past Surgical History:  Procedure Laterality Date  . ABDOMINAL HYSTERECTOMY     1981  . BACK SURGERY    . BREAST EXCISIONAL BIOPSY Right 1979   Negative  . COLONOSCOPY W/ POLYPECTOMY    . COLONOSCOPY WITH PROPOFOL N/A 04/29/2017   Procedure: COLONOSCOPY WITH PROPOFOL;  Surgeon: [name], MD;  Location: [facility] ENDOSCOPY;  Service: Endoscopy;  Laterality: N/A;  . JOINT REPLACEMENT     bilateral knee  . left wrist      surgery x 2   . OPEN REDUCTION INTERNAL FIXATION (ORIF) DISTAL RADIAL FRACTURE Right 06/30/2015   Procedure: OPEN REDUCTION INTERNAL  FIXATION (ORIF) RIGHT DISTAL RADIUS FRACTURE ;  Surgeon: [name], MD;  Location: [facility];  Service: Orthopedics;  Laterality: Right;  . OTHER SURGICAL HISTORY Right    leg - melonoma  . SHOULDER ARTHROSCOPY WITH OPEN ROTATOR CUFF REPAIR Right 10/02/2017   Procedure: Right shoulder mini open rotator cuff repair;  Surgeon: Susa Day, MD;  Location: [facility];  Service: Orthopedics;  Laterality: Right;  90 mins  . TONSILLECTOMY    . TOTAL KNEE ARTHROPLASTY  08/11/2011   Procedure: TOTAL KNEE ARTHROPLASTY;  Surgeon: [name], MD;  Location: [facility];  Service: Orthopedics;  Laterality: Left;  . TOTAL KNEE ARTHROPLASTY Left 2010    There were no vitals filed for this visit.  Subjective Assessment - 12/01/18 1441    Subjective  Patient reports "catching" in R side preventing her from turning and reaching to L.    Pertinent History  Pain onset 4-5 years ago insidiously. Patient has received multiple epidurals for managing the pain with good results. Patient reports MRI positive for pinched nerve L3-4 and L5-S1. Attended physical therapy 9 visits with some improvement. Agg:  flexion activities, sit <> stand, transfers. Ease: moving out of agg position; moving slowly and carefully. Reports HTN, Hyperlipidemia, Cancer (melanoma dx 1970), 2006 lumbar decompression for nerve pain    Limitations  Lifting;House hold activities    How long can you sit comfortably?  indefinitely but pain during transfer    How long can you stand comfortably?  indefinitely    How long can you walk comfortably?  indefinitely    Diagnostic tests  MRI positive as described above    Patient Stated Goals  Improve core strength; reduce pain    Currently in Pain?  Yes    Pain Score  10-Worst pain ever   Report of 12   Pain Location  Back    Pain Orientation  Right;Lower    Pain Descriptors / Indicators  Aching;Sharp    Pain Type  Chronic pain    Pain Onset  More than a month ago        TREATMENT   TE LTRs over  red ball x 40  Hamstring curls over red ball x 30 Supine QL stretch R x 30 sec SKTC x 20 R/L Hooklying active PPT x 30 Tabletop isometric hip flexion 5 sec hold x 5 for core activation Tabletop isometric rotation 5 sec hold x 5 R/L for oblique activation RLE HS stretch x 30 sec SLR R AAROM with UE assist x 10  Gentle lumbar rotation AROM R/L x 5 Standing hip/lumbar ext with UE assist x 10 Ambulation 200 ft Paloff press red theraband 2 x 10 R/L Moist heat x 4 min (unbilled)  TE for reduction of pain and spasm with back muscle.  TA Instruction in sit<> stand and transition supine <> sitting EOB x 3 for minimal back pain with transitions. Patient demonstrates good comprehension.  0/10 back pain at end of treatment       PT Education - 12/01/18 1440    Education Details  form/technique with exercise    Person(s) Educated  Patient    Methods  Explanation;Demonstration;Tactile cues;Verbal cues    Comprehension  Verbalized understanding;Returned demonstration;Verbal cues required;Tactile cues required       PT Short Term Goals - 11/23/18 1006      PT SHORT TERM GOAL #1   Title  Patient will demonstrate adherence to HEP 3x/wk as adjunct to clinical therapy to speed recovery and reduce total number of visits.    Baseline  HEP given    Time  2    Period  Weeks    Status  New    Target Date  12/07/18        PT Long Term Goals - 11/23/18 1008      PT LONG TERM GOAL #1   Title  Patient will reduce score on MODI by 10% to achieve MCID and demonstrate reduced disability.    Baseline  26%    Time  6    Period  Weeks    Status  New    Target Date  01/04/19      PT LONG TERM GOAL #2   Title  Patient will report no increased pain with meal prep activities to indicate improved pain response with repeated flexion.    Baseline  9-10/10    Time  6    Period  Weeks    Status  New    Target Date  01/04/19      PT LONG TERM GOAL #3   Title  Patient will report ability to  perform 5 sit <> stand transfers without increase in pain and with good technique to demonstrate reduced pain with transfers.    Baseline  Hip adduction/UE assist with sit <> stand; reports increase in pain    Time  6    Period  Weeks    Status  New    Target Date  01/04/19            Plan - 12/01/18 1519    Clinical Impression Statement  Patient reports with acute exacerbation of LBP, likely muscle strain/spasm. Pain reduced with gentle therex and heat and patient left able to sit <> stand and walk without pain. Education provided for self-management of muscle strain in future. Patient demonstrates improved sequencing with mobility activities including supine <> sitting EOB. Patient will continue to benefit from skilled PT to reduce pain and return to PLOF.    Personal Factors and Comorbidities  Comorbidity 1;Comorbidity 2;Comorbidity 3+;Age    Comorbidities  HTN, Hyperlipidemia, Cancer    Examination-Activity Limitations  Carry;Stand;Stairs;Locomotion Level;Bend;Lift;Squat;Sit    Examination-Participation Restrictions  Cleaning;Laundry;Meal Prep    Stability/Clinical Decision Making  Stable/Uncomplicated    Rehab Potential  Good    PT Frequency  2x / week    PT Duration  6 weeks    PT Treatment/Interventions  ADLs/Self Care Home Management;Moist Heat;Functional mobility training;Therapeutic activities;Therapeutic exercise;Patient/family education;Neuromuscular re-education;Balance training;Stair training;Manual techniques;Passive range of motion;Joint Manipulations;Taping;Dry needling;Electrical Stimulation;Cryotherapy    PT Next Visit Plan  Progres flexion therex    PT Home Exercise Plan  Standing lumbar ext    Consulted and Agree with Plan of Care  Patient       Patient will benefit from skilled therapeutic intervention in order to improve the following deficits and impairments:  Decreased balance, Decreased endurance, Decreased mobility, Difficulty walking, Hypomobility, Increased  muscle spasms, Impaired sensation, Decreased range of motion, Improper body mechanics, Pain, Impaired flexibility, Decreased strength, Decreased activity tolerance, Abnormal gait  Visit Diagnosis: Chronic right-sided low back pain without sciatica  Muscle weakness (generalized)     Problem List Patient Active Problem List   Diagnosis Date Noted  . PAT (paroxysmal atrial tachycardia) ([place]) 09/30/2018  . Adult idiopathic generalized osteoporosis 06/04/2018  . OSA on CPAP 03/29/2018  . Recurrent UTI 12/27/2017  . Renal cyst 12/27/2017  . Rotator cuff tear, right 10/02/2017  . Complete rotator cuff tear 10/02/2017  . Tubular adenoma 05/27/2017  . Spondylolisthesis 05/13/2017  . Spinal stenosis of lumbar region 05/13/2017  . Neuritis of saphenous nerve 05/13/2017  . Lumbar radiculopathy 05/13/2017  . Essential hypertension 07/31/2016  . Renal artery stenosis ([place]) 07/31/2016  . DJD (degenerative joint disease) 07/31/2016  . Low serum vitamin D 07/09/2015  . Distal radius fracture, right 06/30/2015  . Hyperlipidemia, mixed 04/10/2015  . History of malignant melanoma 02/13/2014  . Pulmonary emboli ([place]) 06/17/2013  . Pernicious anemia 06/17/2013  . Lumbar disc disease 06/17/2013  . Cervical disc disease 06/17/2013  . Basal cell carcinoma of right cheek 04/28/2013  . S/P left TKA 08/11/2011    Virgia Land, SPT 12/01/2018, 3:22 PM  [facility] [address] [place], [address] Phone: [phone]   Fax:  [phone]  Name: Anita Bailey MRN: [id number] Date of Birth: [birth year]

## 2018-12-07 ENCOUNTER — Ambulatory Visit: Payer: Medicare Other

## 2018-12-13 ENCOUNTER — Other Ambulatory Visit: Payer: Self-pay

## 2018-12-13 ENCOUNTER — Ambulatory Visit: Payer: Medicare Other

## 2018-12-13 DIAGNOSIS — M6281 Muscle weakness (generalized): Secondary | ICD-10-CM

## 2018-12-13 DIAGNOSIS — M545 Low back pain, unspecified: Secondary | ICD-10-CM

## 2018-12-13 DIAGNOSIS — G8929 Other chronic pain: Secondary | ICD-10-CM

## 2018-12-13 NOTE — Therapy (Signed)
[facility] [address] [place], [address] Phone: [phone]   Fax:  [phone]  Physical Therapy Treatment  Patient Details  Name: Anita Bailey MRN: [id number] Date of Birth: 05/01/1934 Referring Provider (PT): Vladimir Crofts, MD   Encounter Date: 12/13/2018  PT End of Session - 12/13/18 1033    Visit Number  5    Number of Visits  13    Date for PT Re-Evaluation  01/04/19    Authorization Type  5 / 10    PT Start Time  1034    PT Stop Time  1115    PT Time Calculation (min)  41 min    Activity Tolerance  Patient tolerated treatment well;Patient limited by fatigue;No increased pain    Behavior During Therapy  WFL for tasks assessed/performed       Past Medical History:  Diagnosis Date  . Anemia   . Arthritis   . Cancer ([place])    hx of melanoma in 1971, basal cell   . Cervical disc disease   . Chronic kidney disease    current uti 08/05/11   . Depression   . DVT (deep venous thrombosis) ([place]) 01/2015   was Eliquis until 06/13/15-   . Dysrhythmia    hx of heart racing - in past, "none  in years".  had a stress test > 7 years  . History of recurrent UTIs    "bladder infection"  Had a PICC line in past  . Hyperlipidemia   . Hypertension   . Melanoma ([place])   . Nephrolithiasis   . Pulmonary emboli ([facility])    bilateral 8/12 hospitalized   . Vitamin D deficiency     Past Surgical History:  Procedure Laterality Date  . ABDOMINAL HYSTERECTOMY     1981  . BACK SURGERY    . BREAST EXCISIONAL BIOPSY Right 1979   Negative  . COLONOSCOPY W/ POLYPECTOMY    . COLONOSCOPY WITH PROPOFOL N/A 04/29/2017   Procedure: COLONOSCOPY WITH PROPOFOL;  Surgeon: [name], MD;  Location: [facility] ENDOSCOPY;  Service: Endoscopy;  Laterality: N/A;  . JOINT REPLACEMENT     bilateral knee  . left wrist      surgery x 2   . OPEN REDUCTION INTERNAL FIXATION (ORIF) DISTAL RADIAL FRACTURE Right 06/30/2015    Procedure: OPEN REDUCTION INTERNAL FIXATION (ORIF) RIGHT DISTAL RADIUS FRACTURE ;  Surgeon: [name], MD;  Location: [facility];  Service: Orthopedics;  Laterality: Right;  . OTHER SURGICAL HISTORY Right    leg - melonoma  . SHOULDER ARTHROSCOPY WITH OPEN ROTATOR CUFF REPAIR Right 10/02/2017   Procedure: Right shoulder mini open rotator cuff repair;  Surgeon: Susa Day, MD;  Location: [facility];  Service: Orthopedics;  Laterality: Right;  90 mins  . TONSILLECTOMY    . TOTAL KNEE ARTHROPLASTY  08/11/2011   Procedure: TOTAL KNEE ARTHROPLASTY;  Surgeon: [name], MD;  Location: [facility];  Service: Orthopedics;  Laterality: Left;  . TOTAL KNEE ARTHROPLASTY Left 2010    There were no vitals filed for this visit.  Subjective Assessment - 12/13/18 1036    Subjective  Patient reports her pain is doing well. Reports cooking over thanksgiving but pacing herself with periodic rest breaks. Reports some pain late in evening but relieved with Alleve. Reports knee pain (has B TKA - reports hx consistent with nerve entrapment in scar tisse with surgical ablation without relief).    Pertinent History  Pain onset 4-5 years ago insidiously. Patient has received multiple epidurals for managing the pain with good results. Patient reports MRI positive for pinched nerve L3-4 and L5-S1. Attended physical therapy 9 visits with some improvement. Agg: flexion activities, sit <> stand, transfers. Ease: moving out of agg position; moving slowly and carefully. Reports HTN, Hyperlipidemia, Cancer (melanoma dx 1970), 2006 lumbar decompression for nerve pain    Limitations  Lifting;House hold activities    How long can you sit comfortably?  indefinitely but pain during transfer    How long can you stand comfortably?  indefinitely    How long can you walk comfortably?  indefinitely    Diagnostic tests  MRI positive as described above    Patient Stated Goals  Improve core strength; reduce pain    Currently in Pain?  Yes     Pain Score  3     Pain Location  Knee    Pain Orientation  Left;Right    Pain Descriptors / Indicators  Aching    Pain Type  Neuropathic pain    Pain Onset  More than a month ago    Pain Frequency  Intermittent    Multiple Pain Sites  No       TREATMENT  TA Analysis of functional squat reveals good hip hinging with ROM limited by strength; lumbar compensations resulting in LBP evident at end-ranges.  Chair squats x 20 with airex foam Chair squats x 10 standard height chair   Step ups - multiple heights 8"-12" with 20-30 reps each R/L. Note weakness in lateral hip stabilization partly corrected with cues. Patient demonstrates good tolerance with 8" step but early fatigue is noted L>R; tolerance poor with 9" step and above indicating weakness in full range. Pt instructed in step-ups in daily tasks leading with L for improved strengthening of LLE.  MT STM to B quadriceps in seated to promote recovery and reduce soreness following exercise   PT Education - 12/13/18 1033    Education Details  form/technique with exercise    Person(s) Educated  Patient    Methods  Explanation;Demonstration;Tactile cues;Verbal cues    Comprehension  Verbalized understanding;Returned demonstration;Verbal cues required;Tactile cues required       PT Short Term Goals - 11/23/18 1006      PT SHORT TERM GOAL #1   Title  Patient will demonstrate adherence to HEP 3x/wk as adjunct to clinical therapy to speed recovery and reduce total number of visits.    Baseline  HEP given    Time  2    Period  Weeks    Status  New    Target Date  12/07/18        PT Long Term Goals - 11/23/18 1008      PT LONG TERM GOAL #1   Title  Patient will reduce score on MODI by 10% to achieve MCID and demonstrate reduced disability.    Baseline  26%    Time  6    Period  Weeks    Status  New    Target Date  01/04/19      PT LONG TERM GOAL #2   Title  Patient will report no increased pain with meal prep activities to  indicate improved pain response with repeated flexion.    Baseline  9-10/10    Time  6    Period  Weeks    Status  New    Target Date  01/04/19      PT LONG  TERM GOAL #3   Title  Patient will report ability to perform 5 sit <> stand transfers without increase in pain and with good technique to demonstrate reduced pain with transfers.    Baseline  Hip adduction/UE assist with sit <> stand; reports increase in pain    Time  6    Period  Weeks    Status  New    Target Date  01/04/19            Plan - 12/13/18 1250    Clinical Impression Statement  Patient reports without LBP after holiday weekend which demonstrates marked progress. Analysis of functional squat demonstrates good hip hinging with lumbar stabilization; however weakness in BLE limits pt's ROM and encourages compensation at end-ranges resultiing in LBP. Treatment today focused on LE strengthening in functional patterns and was tolerated well. Deficits evident in lateral hip stabilization and extension strength. Patient will benefit from skilled physical therapy to reduce pain and return to PLOF.    Personal Factors and Comorbidities  Comorbidity 1;Comorbidity 2;Comorbidity 3+;Age    Comorbidities  HTN, Hyperlipidemia, Cancer    Examination-Activity Limitations  Carry;Stand;Stairs;Locomotion Level;Bend;Lift;Squat;Sit    Examination-Participation Restrictions  Cleaning;Laundry;Meal Prep    Stability/Clinical Decision Making  Stable/Uncomplicated    Rehab Potential  Good    PT Frequency  2x / week    PT Duration  6 weeks    PT Treatment/Interventions  ADLs/Self Care Home Management;Moist Heat;Functional mobility training;Therapeutic activities;Therapeutic exercise;Patient/family education;Neuromuscular re-education;Balance training;Stair training;Manual techniques;Passive range of motion;Joint Manipulations;Taping;Dry needling;Electrical Stimulation;Cryotherapy    PT Next Visit Plan  Progress therex    PT Home Exercise Plan   Standing lumbar ext    Consulted and Agree with Plan of Care  Patient       Patient will benefit from skilled therapeutic intervention in order to improve the following deficits and impairments:  Decreased balance, Decreased endurance, Decreased mobility, Difficulty walking, Hypomobility, Increased muscle spasms, Impaired sensation, Decreased range of motion, Improper body mechanics, Pain, Impaired flexibility, Decreased strength, Decreased activity tolerance, Abnormal gait  Visit Diagnosis: Chronic right-sided low back pain without sciatica  Muscle weakness (generalized)     Problem List Patient Active Problem List   Diagnosis Date Noted  . PAT (paroxysmal atrial tachycardia) ([place]) 09/30/2018  . Adult idiopathic generalized osteoporosis 06/04/2018  . OSA on CPAP 03/29/2018  . Recurrent UTI 12/27/2017  . Renal cyst 12/27/2017  . Rotator cuff tear, right 10/02/2017  . Complete rotator cuff tear 10/02/2017  . Tubular adenoma 05/27/2017  . Spondylolisthesis 05/13/2017  . Spinal stenosis of lumbar region 05/13/2017  . Neuritis of saphenous nerve 05/13/2017  . Lumbar radiculopathy 05/13/2017  . Essential hypertension 07/31/2016  . Renal artery stenosis ([place]) 07/31/2016  . DJD (degenerative joint disease) 07/31/2016  . Low serum vitamin D 07/09/2015  . Distal radius fracture, right 06/30/2015  . Hyperlipidemia, mixed 04/10/2015  . History of malignant melanoma 02/13/2014  . Pulmonary emboli ([place]) 06/17/2013  . Pernicious anemia 06/17/2013  . Lumbar disc disease 06/17/2013  . Cervical disc disease 06/17/2013  . Basal cell carcinoma of right cheek 04/28/2013  . S/P left TKA 08/11/2011    Virgia Land, SPT 12/13/2018, 12:58 PM  [facility] [address] [place], [address] Phone: [phone]   Fax:  [phone]  Name: MARCIA MAROTZ MRN: [id number] Date of Birth: 05-16-1934

## 2018-12-15 ENCOUNTER — Ambulatory Visit: Payer: Medicare Other

## 2018-12-15 ENCOUNTER — Ambulatory Visit: Payer: Medicare Other | Attending: Specialist

## 2018-12-15 ENCOUNTER — Other Ambulatory Visit: Payer: Self-pay

## 2018-12-15 DIAGNOSIS — G8929 Other chronic pain: Secondary | ICD-10-CM | POA: Diagnosis present

## 2018-12-15 DIAGNOSIS — M6281 Muscle weakness (generalized): Secondary | ICD-10-CM | POA: Diagnosis present

## 2018-12-15 DIAGNOSIS — M545 Low back pain, unspecified: Secondary | ICD-10-CM

## 2018-12-15 NOTE — Therapy (Signed)
[facility] [address] [place], [address] Phone: [phone]   Fax:  [phone]  Physical Therapy Treatment  Patient Details  Name: Anita Bailey MRN: [id number] Date of Birth: 02-Apr-1934 Referring Provider (PT): Vladimir Crofts, MD   Encounter Date: 12/15/2018  PT End of Session - 12/15/18 1631    Visit Number  6    Number of Visits  13    Date for PT Re-Evaluation  01/04/19    Authorization Type  6 / 10    PT Start Time  1632    PT Stop Time  [id number]    PT Time Calculation (min)  43 min    Activity Tolerance  Patient tolerated treatment well;No increased pain    Behavior During Therapy  WFL for tasks assessed/performed       Past Medical History:  Diagnosis Date  . Anemia   . Arthritis   . Cancer ([place])    hx of melanoma in 1971, basal cell   . Cervical disc disease   . Chronic kidney disease    current uti 08/05/11   . Depression   . DVT (deep venous thrombosis) ([place]) 01/2015   was Eliquis until 06/13/15-   . Dysrhythmia    hx of heart racing - in past, "none  in years".  had a stress test > 7 years  . History of recurrent UTIs    "bladder infection"  Had a PICC line in past  . Hyperlipidemia   . Hypertension   . Melanoma ([place])   . Nephrolithiasis   . Pulmonary emboli ([facility])    bilateral 8/12 hospitalized   . Vitamin D deficiency     Past Surgical History:  Procedure Laterality Date  . ABDOMINAL HYSTERECTOMY     1981  . BACK SURGERY    . BREAST EXCISIONAL BIOPSY Right 1979   Negative  . COLONOSCOPY W/ POLYPECTOMY    . COLONOSCOPY WITH PROPOFOL N/A 04/29/2017   Procedure: COLONOSCOPY WITH PROPOFOL;  Surgeon: [name], MD;  Location: [facility] ENDOSCOPY;  Service: Endoscopy;  Laterality: N/A;  . JOINT REPLACEMENT     bilateral knee  . left wrist      surgery x 2   . OPEN REDUCTION INTERNAL FIXATION (ORIF) DISTAL RADIAL FRACTURE Right 06/30/2015   Procedure: OPEN REDUCTION INTERNAL  FIXATION (ORIF) RIGHT DISTAL RADIUS FRACTURE ;  Surgeon: [name], MD;  Location: [facility];  Service: Orthopedics;  Laterality: Right;  . OTHER SURGICAL HISTORY Right    leg - melonoma  . SHOULDER ARTHROSCOPY WITH OPEN ROTATOR CUFF REPAIR Right 10/02/2017   Procedure: Right shoulder mini open rotator cuff repair;  Surgeon: Susa Day, MD;  Location: [facility];  Service: Orthopedics;  Laterality: Right;  90 mins  . TONSILLECTOMY    . TOTAL KNEE ARTHROPLASTY  08/11/2011   Procedure: TOTAL KNEE ARTHROPLASTY;  Surgeon: [name], MD;  Location: [facility];  Service: Orthopedics;  Laterality: Left;  . TOTAL KNEE ARTHROPLASTY Left 2010    There were no vitals filed for this visit.  Subjective Assessment - 12/15/18 1633    Subjective  Patient reports a pleasant morning. Reports that activity modifications have reduced her pain significantly.    Pertinent History  Pain onset 4-5 years ago insidiously. Patient has received multiple epidurals for managing the pain with good results. Patient reports MRI positive for pinched nerve L3-4 and L5-S1. Attended physical therapy 9 visits with some improvement. Agg:  flexion activities, sit <> stand, transfers. Ease: moving out of agg position; moving slowly and carefully. Reports HTN, Hyperlipidemia, Cancer (melanoma dx 1970), 2006 lumbar decompression for nerve pain    Limitations  Lifting;House hold activities    How long can you sit comfortably?  indefinitely but pain during transfer    How long can you stand comfortably?  indefinitely    How long can you walk comfortably?  indefinitely    Diagnostic tests  MRI positive as described above    Patient Stated Goals  Improve core strength; reduce pain    Currently in Pain?  No/denies    Pain Onset  More than a month ago       TREATMENT   TE STS with yellow theraband around knees for abductor cueing x 10 Chair squats with elevated seat with abductor cueing x 10  Step ups 8" step with mirror feedback 2  x 10 R/L cues for alignment of knee and reduction of valgus Lateral step ups 4" step x 15 R/L  SLS taps in star pattern R/L x 20 taps  Leg press 35# > 55# > 75# x 15 each Squat with 5# weight in UE and low reach to simulate loading dishwasher x 20 R/L patient demonstrates good lumbar stability with minimal rotation requiring minimal cueing  TE for strengthening of LE extensors and lateral hip musculature for improved stability and reduced LBP with functional activities    PT Education - 12/15/18 1631    Education Details  form/technique with exercise    Person(s) Educated  Patient    Methods  Explanation;Demonstration;Tactile cues;Verbal cues    Comprehension  Verbalized understanding;Returned demonstration;Verbal cues required;Tactile cues required       PT Short Term Goals - 11/23/18 1006      PT SHORT TERM GOAL #1   Title  Patient will demonstrate adherence to HEP 3x/wk as adjunct to clinical therapy to speed recovery and reduce total number of visits.    Baseline  HEP given    Time  2    Period  Weeks    Status  New    Target Date  12/07/18        PT Long Term Goals - 11/23/18 1008      PT LONG TERM GOAL #1   Title  Patient will reduce score on MODI by 10% to achieve MCID and demonstrate reduced disability.    Baseline  26%    Time  6    Period  Weeks    Status  New    Target Date  01/04/19      PT LONG TERM GOAL #2   Title  Patient will report no increased pain with meal prep activities to indicate improved pain response with repeated flexion.    Baseline  9-10/10    Time  6    Period  Weeks    Status  New    Target Date  01/04/19      PT LONG TERM GOAL #3   Title  Patient will report ability to perform 5 sit <> stand transfers without increase in pain and with good technique to demonstrate reduced pain with transfers.    Baseline  Hip adduction/UE assist with sit <> stand; reports increase in pain    Time  6    Period  Weeks    Status  New    Target Date   01/04/19            Plan -  12/15/18 1848    Clinical Impression Statement  Patient tolerates exercises well and reports no pain with ADLs representing a notable improvement in carryover. Patient demonstrates poor stabilization through hips with step ups and functional movement but is able to correct with cues. Patient is improving overall and will benefit from skilled physical therapy to correct remaining stability deficits for long-term reduction in LBP.    Personal Factors and Comorbidities  Comorbidity 1;Comorbidity 2;Comorbidity 3+;Age    Comorbidities  HTN, Hyperlipidemia, Cancer    Examination-Activity Limitations  Carry;Stand;Stairs;Locomotion Level;Bend;Lift;Squat;Sit    Examination-Participation Restrictions  Cleaning;Laundry;Meal Prep    Stability/Clinical Decision Making  Stable/Uncomplicated    Rehab Potential  Good    PT Frequency  2x / week    PT Duration  6 weeks    PT Treatment/Interventions  ADLs/Self Care Home Management;Moist Heat;Functional mobility training;Therapeutic activities;Therapeutic exercise;Patient/family education;Neuromuscular re-education;Balance training;Stair training;Manual techniques;Passive range of motion;Joint Manipulations;Taping;Dry needling;Electrical Stimulation;Cryotherapy    PT Next Visit Plan  Progress therex    PT Home Exercise Plan  Standing lumbar ext    Consulted and Agree with Plan of Care  Patient       Patient will benefit from skilled therapeutic intervention in order to improve the following deficits and impairments:  Decreased balance, Decreased endurance, Decreased mobility, Difficulty walking, Hypomobility, Increased muscle spasms, Impaired sensation, Decreased range of motion, Improper body mechanics, Pain, Impaired flexibility, Decreased strength, Decreased activity tolerance, Abnormal gait  Visit Diagnosis: Chronic right-sided low back pain without sciatica  Muscle weakness (generalized)     Problem List Patient  Active Problem List   Diagnosis Date Noted  . PAT (paroxysmal atrial tachycardia) ([place]) 09/30/2018  . Adult idiopathic generalized osteoporosis 06/04/2018  . OSA on CPAP 03/29/2018  . Recurrent UTI 12/27/2017  . Renal cyst 12/27/2017  . Rotator cuff tear, right 10/02/2017  . Complete rotator cuff tear 10/02/2017  . Tubular adenoma 05/27/2017  . Spondylolisthesis 05/13/2017  . Spinal stenosis of lumbar region 05/13/2017  . Neuritis of saphenous nerve 05/13/2017  . Lumbar radiculopathy 05/13/2017  . Essential hypertension 07/31/2016  . Renal artery stenosis ([place]) 07/31/2016  . DJD (degenerative joint disease) 07/31/2016  . Low serum vitamin D 07/09/2015  . Distal radius fracture, right 06/30/2015  . Hyperlipidemia, mixed 04/10/2015  . History of malignant melanoma 02/13/2014  . Pulmonary emboli ([place]) 06/17/2013  . Pernicious anemia 06/17/2013  . Lumbar disc disease 06/17/2013  . Cervical disc disease 06/17/2013  . Basal cell carcinoma of right cheek 04/28/2013  . S/P left TKA 08/11/2011    Virgia Land, SPT 12/15/2018, 7:42 PM  [facility] [address] [place], [address] Phone: [phone]   Fax:  [phone]  Name: Anita Bailey MRN: [id number] Date of Birth: 12/29/1934

## 2018-12-21 ENCOUNTER — Ambulatory Visit: Payer: Medicare Other

## 2018-12-21 ENCOUNTER — Other Ambulatory Visit: Payer: Self-pay

## 2018-12-21 DIAGNOSIS — M545 Low back pain, unspecified: Secondary | ICD-10-CM

## 2018-12-21 DIAGNOSIS — M6281 Muscle weakness (generalized): Secondary | ICD-10-CM

## 2018-12-21 DIAGNOSIS — G8929 Other chronic pain: Secondary | ICD-10-CM

## 2018-12-21 NOTE — Therapy (Signed)
[facility] [address], [place], [address] Phone: [phone]   Fax:  [phone]  Physical Therapy Treatment  Patient Details  Name: Anita Bailey MRN: [id number] Date of Birth: February 13, 1934 Referring Provider (PT): Vladimir Crofts, MD   Encounter Date: 12/21/2018  PT End of Session - 12/21/18 1035    Visit Number  7    Number of Visits  13    Date for PT Re-Evaluation  01/04/19    Authorization Type  7 / 10    PT Start Time  1036    PT Stop Time  1115    PT Time Calculation (min)  39 min    Activity Tolerance  Patient tolerated treatment well;No increased pain    Behavior During Therapy  WFL for tasks assessed/performed       Past Medical History:  Diagnosis Date  . Anemia   . Arthritis   . Cancer ([facility])    hx of melanoma in 1971, basal cell   . Cervical disc disease   . Chronic kidney disease    current uti 08/05/11   . Depression   . DVT (deep venous thrombosis) ([facility]) 01/2015   was Eliquis until 06/13/15-   . Dysrhythmia    hx of heart racing - in past, "none  in years".  had a stress test > 7 years  . History of recurrent UTIs    "bladder infection"  Had a PICC line in past  . Hyperlipidemia   . Hypertension   . Melanoma ([place])   . Nephrolithiasis   . Pulmonary emboli ([facility])    bilateral 8/12 hospitalized   . Vitamin D deficiency     Past Surgical History:  Procedure Laterality Date  . ABDOMINAL HYSTERECTOMY     1981  . BACK SURGERY    . BREAST EXCISIONAL BIOPSY Right 1979   Negative  . COLONOSCOPY W/ POLYPECTOMY    . COLONOSCOPY WITH PROPOFOL N/A 04/29/2017   Procedure: COLONOSCOPY WITH PROPOFOL;  Surgeon: [name], MD;  Location[facility];  Service: Endoscopy;  Laterality: N/A;  . JOINT REPLACEMENT     bilateral knee  . left wrist      surgery x 2   . OPEN REDUCTION INTERNAL FIXATION (ORIF) DISTAL RADIAL FRACTURE Right 06/30/2015   Procedure: OPEN REDUCTION INTERNAL  FIXATION (ORIF) RIGHT DISTAL RADIUS FRACTURE ;  Surgeon: [name], MD;  Location: [facility];  Service: Orthopedics;  Laterality: Right;  . OTHER SURGICAL HISTORY Right    leg - melonoma  . SHOULDER ARTHROSCOPY WITH OPEN ROTATOR CUFF REPAIR Right 10/02/2017   Procedure: Right shoulder mini open rotator cuff repair;  Surgeon: Susa Day, MD;  Location: [facility];  Service: Orthopedics;  Laterality: Right;  90 mins  . TONSILLECTOMY    . TOTAL KNEE ARTHROPLASTY  08/11/2011   Procedure: TOTAL KNEE ARTHROPLASTY;  Surgeon: [name], MD;  Location: [facility];  Service: Orthopedics;  Laterality: Left;  . TOTAL KNEE ARTHROPLASTY Left 2010    There were no vitals filed for this visit.  Subjective Assessment - 12/21/18 1038    Subjective  Patient reports desiring an exercise program and considering gym/[employer] options. Patient reports feeling good today but still has pain with rising from low chair and with dishwasher.    Pertinent History  LBP. Pain onset 4-5 years ago insidiously. Patient has received multiple epidurals for managing the pain with good results. Patient reports MRI positive  for pinched nerve L3-4 and L5-S1. Attended physical therapy 9 visits with some improvement. Agg: flexion activities, sit <> stand, transfers. Ease: moving out of agg position; moving slowly and carefully. Reports HTN, Hyperlipidemia, Cancer (melanoma dx 1970), 2006 lumbar decompression for nerve pain    Limitations  Lifting;House hold activities    How long can you sit comfortably?  indefinitely but pain during transfer    How long can you stand comfortably?  indefinitely    How long can you walk comfortably?  indefinitely    Diagnostic tests  MRI positive as described above    Patient Stated Goals  Improve core strength; reduce pain    Currently in Pain?  No/denies    Pain Orientation  Right;Left    Pain Onset  More than a month ago    Multiple Pain Sites  No      TREATMENT Discussion re: gym  program. Pt advised to begin gym program with low-impact aerobic exercise and referred to [employer] program at [employer].  TE Bridges x 10 Hooklying hip abd against red theraband R/L x 10 Bridge with march - discontinued due to cramping Lateral stepping 6" steps x 20 R/L Star stepping x 20 R/L with cues for gentle taps for increased stability challenge over standing LE Star stepping with sustained hold Running man x 10 R/L Quadruped with UE marches x 10, LE kicks x 5 Steps outs with green theraband R/L x 10 - note mild LBP with step-out to R  TE for improved hip strength and core stability for reduction in LBP    PT Education - 12/21/18 1035    Education Details  form/technique with exercise; initiation of gym program    Person(s) Educated  Patient    Methods  Explanation;Demonstration;Tactile cues;Verbal cues    Comprehension  Verbalized understanding;Returned demonstration;Verbal cues required;Tactile cues required       PT Short Term Goals - 11/23/18 1006      PT SHORT TERM GOAL #1   Title  Patient will demonstrate adherence to HEP 3x/wk as adjunct to clinical therapy to speed recovery and reduce total number of visits.    Baseline  HEP given    Time  2    Period  Weeks    Status  New    Target Date  12/07/18        PT Long Term Goals - 11/23/18 1008      PT LONG TERM GOAL #1   Title  Patient will reduce score on MODI by 10% to achieve MCID and demonstrate reduced disability.    Baseline  26%    Time  6    Period  Weeks    Status  New    Target Date  01/04/19      PT LONG TERM GOAL #2   Title  Patient will report no increased pain with meal prep activities to indicate improved pain response with repeated flexion.    Baseline  9-10/10    Time  6    Period  Weeks    Status  New    Target Date  01/04/19      PT LONG TERM GOAL #3   Title  Patient will report ability to perform 5 sit <> stand transfers without increase in pain and with good technique to  demonstrate reduced pain with transfers.    Baseline  Hip adduction/UE assist with sit <> stand; reports increase in pain    Time  6  Period  Weeks    Status  New    Target Date  01/04/19            Plan - 12/21/18 1154    Clinical Impression Statement  Treatment today focused on hip extension/stabilization strengthening for reduced stress on lumbar spine with rising from low chair and asymmetrical stabilization exercises for improved activation of multifidi. Patient tolerates exercises well although deficits in hip extension and stability are noted. Patient will benefit from skilled physical therapy to reduce LBP with functional activities and return to PLOF    Personal Factors and Comorbidities  Comorbidity 1;Comorbidity 2;Comorbidity 3+;Age    Comorbidities  HTN, Hyperlipidemia, Cancer    Examination-Activity Limitations  Carry;Stand;Stairs;Locomotion Level;Bend;Lift;Squat;Sit    Examination-Participation Restrictions  Cleaning;Laundry;Meal Prep    Stability/Clinical Decision Making  Stable/Uncomplicated    Rehab Potential  Good    PT Frequency  2x / week    PT Duration  6 weeks    PT Treatment/Interventions  ADLs/Self Care Home Management;Moist Heat;Functional mobility training;Therapeutic activities;Therapeutic exercise;Patient/family education;Neuromuscular re-education;Balance training;Stair training;Manual techniques;Passive range of motion;Joint Manipulations;Taping;Dry needling;Electrical Stimulation;Cryotherapy    PT Next Visit Plan  Progress asymmetrical/multiplanar therex for multifidi, hip stabilization, extension power    PT Home Exercise Plan  Running man, resisted rotation walk outs    Consulted and Agree with Plan of Care  Patient       Patient will benefit from skilled therapeutic intervention in order to improve the following deficits and impairments:  Decreased balance, Decreased endurance, Decreased mobility, Difficulty walking, Hypomobility, Increased muscle  spasms, Impaired sensation, Decreased range of motion, Improper body mechanics, Pain, Impaired flexibility, Decreased strength, Decreased activity tolerance, Abnormal gait  Visit Diagnosis: Chronic right-sided low back pain without sciatica  Muscle weakness (generalized)     Problem List Patient Active Problem List   Diagnosis Date Noted  . PAT (paroxysmal atrial tachycardia) ([facility]) 09/30/2018  . Adult idiopathic generalized osteoporosis 06/04/2018  . OSA on CPAP 03/29/2018  . Recurrent UTI 12/27/2017  . Renal cyst 12/27/2017  . Rotator cuff tear, right 10/02/2017  . Complete rotator cuff tear 10/02/2017  . Tubular adenoma 05/27/2017  . Spondylolisthesis 05/13/2017  . Spinal stenosis of lumbar region 05/13/2017  . Neuritis of saphenous nerve 05/13/2017  . Lumbar radiculopathy 05/13/2017  . Essential hypertension 07/31/2016  . Renal artery stenosis ([place]) 07/31/2016  . DJD (degenerative joint disease) 07/31/2016  . Low serum vitamin D 07/09/2015  . Distal radius fracture, right 06/30/2015  . Hyperlipidemia, mixed 04/10/2015  . History of malignant melanoma 02/13/2014  . Pulmonary emboli ([facility]) 06/17/2013  . Pernicious anemia 06/17/2013  . Lumbar disc disease 06/17/2013  . Cervical disc disease 06/17/2013  . Basal cell carcinoma of right cheek 04/28/2013  . S/P left TKA 08/11/2011    Virgia Land, SPT 12/21/2018, 12:03 PM  [facility] [address] [place], [address] Phone: [phone]   Fax:  [phone]  Name: HARDEEP HOPPING MRN: [id number] Date of Birth: Mar 21, 1934

## 2018-12-23 ENCOUNTER — Other Ambulatory Visit: Payer: Self-pay

## 2018-12-23 ENCOUNTER — Ambulatory Visit (INDEPENDENT_AMBULATORY_CARE_PROVIDER_SITE_OTHER): Payer: Medicare Other | Admitting: Urology

## 2018-12-23 ENCOUNTER — Encounter: Payer: Self-pay | Admitting: Urology

## 2018-12-23 VITALS — BP 152/69 | HR 67 | Ht 65.0 in | Wt 150.0 lb

## 2018-12-23 DIAGNOSIS — N3281 Overactive bladder: Secondary | ICD-10-CM | POA: Diagnosis not present

## 2018-12-23 DIAGNOSIS — N390 Urinary tract infection, site not specified: Secondary | ICD-10-CM

## 2018-12-23 MED ORDER — MIRABEGRON ER 25 MG PO TB24: 25.0000 mg | ORAL_TABLET | Freq: Every day | ORAL | 0 refills | Status: DC

## 2018-12-23 NOTE — Progress Notes (Signed)
12/23/2018 4:46 PM   Anita Bailey [place] January 17, 1934 [id number]  Referring provider: Rusty Aus, MD [facility] [place],  [place] [address]  Chief Complaint  Patient presents with  . Follow-up    1year    HPI: 84 y.o. female with a history of recurrent UTI and a simple left renal cyst.  Since her visit last year she denies UTI symptoms or treatment for a urinary tract infection.  She does have chronic bacteriuria and is asymptomatic.  Denies flank, abdominal or pelvic pain.  Denies gross hematuria.  She does relate to urinary frequency, urgency with occasional episodes of urge incontinence.  She does wear a light pad.  PMH: Past Medical History:  Diagnosis Date  . Anemia   . Arthritis   . Cancer ([place])    hx of melanoma in 1971, basal cell   . Cervical disc disease   . Chronic kidney disease    current uti 08/05/11   . Depression   . DVT (deep venous thrombosis) ([place]) 01/2015   was Eliquis until 06/13/15-   . Dysrhythmia    hx of heart racing - in past, "none  in years".  had a stress test > 7 years  . History of recurrent UTIs    "bladder infection"  Had a PICC line in past  . Hyperlipidemia   . Hypertension   . Melanoma ([place])   . Nephrolithiasis   . Pulmonary emboli ([facility])    bilateral 8/12 hospitalized   . Vitamin D deficiency     Surgical History: Past Surgical History:  Procedure Laterality Date  . ABDOMINAL HYSTERECTOMY     1981  . BACK SURGERY    . BREAST EXCISIONAL BIOPSY Right 1979   Negative  . COLONOSCOPY W/ POLYPECTOMY    . COLONOSCOPY WITH PROPOFOL N/A 04/29/2017   Procedure: COLONOSCOPY WITH PROPOFOL;  Surgeon: [name], MD;  Location: [facility] ENDOSCOPY;  Service: Endoscopy;  Laterality: N/A;  . JOINT REPLACEMENT     bilateral knee  . left wrist      surgery x 2   . OPEN REDUCTION INTERNAL FIXATION (ORIF) DISTAL RADIAL FRACTURE Right 06/30/2015   Procedure: OPEN REDUCTION INTERNAL FIXATION (ORIF)  RIGHT DISTAL RADIUS FRACTURE ;  Surgeon: [name], MD;  Location: [facility];  Service: Orthopedics;  Laterality: Right;  . OTHER SURGICAL HISTORY Right    leg - melonoma  . SHOULDER ARTHROSCOPY WITH OPEN ROTATOR CUFF REPAIR Right 10/02/2017   Procedure: Right shoulder mini open rotator cuff repair;  Surgeon: Susa Day, MD;  Location: [facility];  Service: Orthopedics;  Laterality: Right;  90 mins  . TONSILLECTOMY    . TOTAL KNEE ARTHROPLASTY  08/11/2011   Procedure: TOTAL KNEE ARTHROPLASTY;  Surgeon: [name], MD;  Location: [facility];  Service: Orthopedics;  Laterality: Left;  . TOTAL KNEE ARTHROPLASTY Left 2010    Home Medications:  Allergies as of 12/23/2018      Reactions   Penicillins Anaphylaxis, Other (See Comments)   Has patient had a PCN reaction causing immediate rash, facial/tongue/throat swelling, SOB or lightheadedness with hypotension: yes Has patient had a PCN reaction causing severe rash involving mucus membranes or skin necrosis no Has patient had a PCN reaction that required hospitalization no Has patient had a PCN reaction occurring within the last 10 years: no If all of the above answers are "NO", then may proceed with Cephalosporin use.   Sulfa Antibiotics Other (See Comments), Itching, Rash   "  pass out" Other reaction(s): Other (See Comments), Syncope Is able to take some sulfa abx per pt. But allergic to most syncope   Latex Rash, Other (See Comments)   Blisters   Monosodium Glutamate Other (See Comments)   Migraine headache   Caffeine Swelling   Celebrex [celecoxib] Rash   Clindamycin/lincomycin Rash   Diclofenac Rash   Elastic Bandages & [zinc] Rash   Allergic to Elastic in underwear   Macrobid [nitrofurantoin Monohyd Macro] Rash   Tape Itching, Swelling, Other (See Comments)   Redness, burning      Medication List       Accurate as of December 23, 2018  4:46 PM. If you have any questions, ask your nurse or doctor.        STOP taking these  medications   doxycycline 100 MG capsule Commonly known as: VIBRAMYCIN Stopped by: Abbie Sons, MD   furosemide 20 MG tablet Commonly known as: LASIX Stopped by: Abbie Sons, MD   HYDROcodone-acetaminophen 5-325 MG tablet Commonly known as: NORCO/VICODIN Stopped by: Abbie Sons, MD   meloxicam 15 MG tablet Commonly known as: MOBIC Stopped by: Abbie Sons, MD   triamcinolone cream 0.1 % Commonly known as: KENALOG Stopped by: Abbie Sons, MD     TAKE these medications   cyanocobalamin 1000 MCG/ML injection Commonly known as: (VITAMIN B-12) Inject 1,000 mcg into the muscle every 30 (thirty) days.   diclofenac sodium 1 % Gel Commonly known as: VOLTAREN Apply 2 g topically 2 (two) times daily as needed (For pain.).   fluocinonide cream 0.05 % Commonly known as: LIDEX fluocinonide 0.05 % topical cream  APPLY TWICE DAILY TO RASH UNTIL CLEAR   Lyrica 50 MG capsule Generic drug: pregabalin Take 50 mg by mouth 2 (two) times daily.   metoprolol succinate 50 MG 24 hr tablet Commonly known as: TOPROL-XL Take 50 mg by mouth 2 (two) times daily. Take with or immediately following a meal.   mirabegron ER 25 MG Tb24 tablet Commonly known as: MYRBETRIQ Take 1 tablet (25 mg total) by mouth daily. Started by: Abbie Sons, MD   traMADol 50 MG tablet Commonly known as: ULTRAM tramadol 50 mg tablet   venlafaxine XR 75 MG 24 hr capsule Commonly known as: EFFEXOR-XR Take 75 mg by mouth daily.   vitamin C 1000 MG tablet Take 1,000 mg by mouth daily.   Vitamin D3 125 MCG (5000 UT) Tabs Take 5,000 Units by mouth daily.   zolpidem 5 MG tablet Commonly known as: AMBIEN Take 2.5-5 mg by mouth at bedtime.       Allergies:  Allergies  Allergen Reactions  . Penicillins Anaphylaxis and Other (See Comments)    Has patient had a PCN reaction causing immediate rash, facial/tongue/throat swelling, SOB or lightheadedness with hypotension: yes Has patient had  a PCN reaction causing severe rash involving mucus membranes or skin necrosis no Has patient had a PCN reaction that required hospitalization no Has patient had a PCN reaction occurring within the last 10 years: no If all of the above answers are "NO", then may proceed with Cephalosporin use.   . Sulfa Antibiotics Other (See Comments), Itching and Rash    "pass out" Other reaction(s): Other (See Comments), Syncope Is able to take some sulfa abx per pt. But allergic to most syncope   . Latex Rash and Other (See Comments)    Blisters   . Monosodium Glutamate Other (See Comments)    Migraine headache  .  Caffeine Swelling  . Celebrex [Celecoxib] Rash  . Clindamycin/Lincomycin Rash  . Diclofenac Rash  . Elastic Bandages & [Zinc] Rash    Allergic to Elastic in underwear  . Macrobid [Nitrofurantoin Monohyd Macro] Rash  . Tape Itching, Swelling and Other (See Comments)    Redness, burning    Family History: Family History  Problem Relation Age of Onset  . Breast cancer Cousin     Social History:  reports that she has never smoked. She has never used smokeless tobacco. She reports current alcohol use of about 1.0 standard drinks of alcohol per week. She reports that she does not use drugs.  ROS: UROLOGY Frequent Urination?: Yes Hard to postpone urination?: Yes Burning/pain with urination?: No Get up at night to urinate?: Yes Leakage of urine?: Yes Urine stream starts and stops?: No Trouble starting stream?: No Do you have to strain to urinate?: No Blood in urine?: No Urinary tract infection?: Yes Sexually transmitted disease?: No Injury to kidneys or bladder?: No Painful intercourse?: No Weak stream?: No Currently pregnant?: No Vaginal bleeding?: No Last menstrual period?: n  Gastrointestinal Nausea?: No Vomiting?: No Indigestion/heartburn?: No Diarrhea?: No Constipation?: No  Constitutional Fever: No Night sweats?: No Weight loss?: No Fatigue?: No  Skin  Skin rash/lesions?: No Itching?: No  Eyes Blurred vision?: No Double vision?: No  Ears/Nose/Throat Sore throat?: No Sinus problems?: No  Hematologic/Lymphatic Swollen glands?: No Easy bruising?: No  Cardiovascular Leg swelling?: No Chest pain?: No  Respiratory Cough?: No Shortness of breath?: No  Endocrine Excessive thirst?: No  Musculoskeletal Back pain?: Yes Joint pain?: No  Neurological Headaches?: No Dizziness?: No  Psychologic Depression?: No Anxiety?: No  Physical Exam: BP (!) 152/69   Pulse 67   Ht 5\' 5"  (1.651 m)   Wt 150 lb (68 kg)   BMI 24.96 kg/m   Constitutional:  Alert and oriented, No acute distress. HEENT: [place] AT, moist mucus membranes.  Trachea midline, no masses. Cardiovascular: No clubbing, cyanosis, or edema. Respiratory: Normal respiratory effort, no increased work of breathing. Neurologic: Grossly intact, no focal deficits, moving all 4 extremities. Psychiatric: Normal mood and affect.  Laboratory Data:  Urinalysis Dipstick 1+ leukocytes Microscopy 11-30 WBC, moderate bacteria  Assessment & Plan:    - Recurrent UTI No symptomatic UTIs in the past 12 months.  Chronic asymptomatic bacteriuria.  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  She was interested in a trial of medical management and was given samples of Myrbetriq 25 mg daily.  She will call back in 1 month regarding efficacy of this medication.  Continue annual follow-up.   Abbie Sons, [place] [address], [place], [place] [address] [phone]

## 2018-12-24 LAB — URINALYSIS, COMPLETE
Bilirubin, UA: NEGATIVE
Glucose, UA: NEGATIVE
Ketones, UA: NEGATIVE
Nitrite, UA: NEGATIVE
Protein,UA: NEGATIVE
RBC, UA: NEGATIVE
Specific Gravity, UA: 1.02 (ref 1.005–1.030)
Urobilinogen, Ur: 0.2 mg/dL (ref 0.2–1.0)
pH, UA: 6 (ref 5.0–7.5)

## 2018-12-24 LAB — MICROSCOPIC EXAMINATION: RBC, Urine: NONE SEEN /hpf (ref 0–2)

## 2018-12-29 ENCOUNTER — Ambulatory Visit: Payer: Medicare Other

## 2018-12-29 NOTE — Therapy (Signed)
[facility] [address] [place], [address] Phone: [phone]   Fax:  [phone]  Patient Details  Name: Anita Bailey MRN: [id number] Date of Birth: [birth year] Referring Provider:  Vladimir Crofts, MD  Encounter Date: 10/05/2018  This was a cancelled visit and was entered in error.  Blythe Stanford, PT DPT 12/29/2018, 10:48 AM  [facility] [address], [place], [address] Phone: [phone]   Fax:  [phone]

## 2019-01-04 ENCOUNTER — Other Ambulatory Visit: Payer: Self-pay

## 2019-01-04 ENCOUNTER — Ambulatory Visit: Payer: Medicare Other

## 2019-01-04 DIAGNOSIS — G8929 Other chronic pain: Secondary | ICD-10-CM

## 2019-01-04 DIAGNOSIS — M545 Low back pain, unspecified: Secondary | ICD-10-CM

## 2019-01-04 DIAGNOSIS — M6281 Muscle weakness (generalized): Secondary | ICD-10-CM

## 2019-01-04 NOTE — Therapy (Addendum)
[facility] [address], [place], [address] Phone: [phone]   Fax:  [phone]  Physical Therapy Treatment/ Progress Note  Patient Details  Name: Anita Bailey MRN: [id number] Date of Birth: 1934-05-27 Referring Provider (PT): Vladimir Crofts, MD  11/23/2018- 01/04/2019  Encounter Date: 01/04/2019  PT End of Session - 01/04/19 1116    Visit Number  8    Number of Visits  13    Date for PT Re-Evaluation  01/04/19    Authorization Type  7 / 10    PT Start Time  [id number]    PT Stop Time  1118    PT Time Calculation (min)  38 min    Activity Tolerance  Patient tolerated treatment well;No increased pain    Behavior During Therapy  WFL for tasks assessed/performed       Past Medical History:  Diagnosis Date  . Anemia   . Arthritis   . Cancer ([place])    hx of melanoma in 1971, basal cell   . Cervical disc disease   . Chronic kidney disease    current uti 08/05/11   . Depression   . DVT (deep venous thrombosis) ([place]) 01/2015   was Eliquis until 06/13/15-   . Dysrhythmia    hx of heart racing - in past, "none  in years".  had a stress test > 7 years  . History of recurrent UTIs    "bladder infection"  Had a PICC line in past  . Hyperlipidemia   . Hypertension   . Melanoma ([place])   . Nephrolithiasis   . Pulmonary emboli ([facility])    bilateral 8/12 hospitalized   . Vitamin D deficiency     Past Surgical History:  Procedure Laterality Date  . ABDOMINAL HYSTERECTOMY     1981  . BACK SURGERY    . BREAST EXCISIONAL BIOPSY Right 1979   Negative  . COLONOSCOPY W/ POLYPECTOMY    . COLONOSCOPY WITH PROPOFOL N/A 04/29/2017   Procedure: COLONOSCOPY WITH PROPOFOL;  Surgeon: [name], MD;  Location: [facility] ENDOSCOPY;  Service: Endoscopy;  Laterality: N/A;  . JOINT REPLACEMENT     bilateral knee  . left wrist      surgery x 2   . OPEN REDUCTION INTERNAL FIXATION (ORIF) DISTAL RADIAL FRACTURE Right  06/30/2015   Procedure: OPEN REDUCTION INTERNAL FIXATION (ORIF) RIGHT DISTAL RADIUS FRACTURE ;  Surgeon: [name], MD;  Location: [facility];  Service: Orthopedics;  Laterality: Right;  . OTHER SURGICAL HISTORY Right    leg - melonoma  . SHOULDER ARTHROSCOPY WITH OPEN ROTATOR CUFF REPAIR Right 10/02/2017   Procedure: Right shoulder mini open rotator cuff repair;  Surgeon: Susa Day, MD;  Location: [facility];  Service: Orthopedics;  Laterality: Right;  90 mins  . TONSILLECTOMY    . TOTAL KNEE ARTHROPLASTY  08/11/2011   Procedure: TOTAL KNEE ARTHROPLASTY;  Surgeon: [name], MD;  Location: [facility];  Service: Orthopedics;  Laterality: Left;  . TOTAL KNEE ARTHROPLASTY Left 2010    There were no vitals filed for this visit.  Subjective Assessment - 01/04/19 1042    Subjective  Says she has been using self-described "roll down" method to sit or rise from seated but is getting a painful catch. Worse in PM. Has used Voltaren cream and IcyHot without relief.    Pertinent History  LBP. Pain onset 4-5 years ago insidiously. Patient has received multiple epidurals for managing the  pain with good results. Patient reports MRI positive for pinched nerve L3-4 and L5-S1. Attended physical therapy 9 visits with some improvement. Agg: flexion activities, sit <> stand, transfers. Ease: moving out of agg position; moving slowly and carefully. Reports HTN, Hyperlipidemia, Cancer (melanoma dx 1970), 2006 lumbar decompression for nerve pain    Limitations  Lifting;House hold activities    How long can you sit comfortably?  indefinitely but pain during transfer    How long can you stand comfortably?  indefinitely    How long can you walk comfortably?  indefinitely    Diagnostic tests  MRI positive as described above    Patient Stated Goals  Improve core strength; reduce pain    Currently in Pain?  No/denies    Pain Onset  More than a month ago       TREATMENT  TE Lumbar extension leans in standing with  BUE support x 10 STS x 5 raised surface - no c/o pain Seated pelvic tilts x 10 - pain with APT and returning to neutral from PPT Lumbar stretch with ball rollout on floor - changed to ball on table after c/o pain x 10, reduced c/o pain but pt demonstrates active lumbar extension STS from progressively lower surfaces 3 x 5 - cues for forward lean, increased extensor activation, performed without pain Supine pelvic tucks x 10 Segmental bridges x 10 - added to HEP  MT Myofascial release and soft tissue mobilization to lumbar paraspinals L>R A-P mobilization T7-10 grade 3 x 20 each Mobilization concurrent with prone extension x 10     PT Education - 01/04/19 1113    Education Details  form/technique with exercise    Person(s) Educated  Patient    Methods  Explanation;Demonstration;Tactile cues;Verbal cues    Comprehension  Verbalized understanding;Returned demonstration;Verbal cues required;Tactile cues required       PT Short Term Goals - 11/23/18 1006      PT SHORT TERM GOAL #1   Title  Patient will demonstrate adherence to HEP 3x/wk as adjunct to clinical therapy to speed recovery and reduce total number of visits.    Baseline  HEP given; Independent   Time  2    Period  Weeks    Status  Acheived   Target Date  12/07/18        PT Long Term Goals - 11/23/18 1008      PT LONG TERM GOAL #1   Title  Patient will reduce score on MODI by 10% to achieve MCID and demonstrate reduced disability. 01/04/2019: Deferred    Baseline  26%    Time  6    Period  Weeks    Status  Deferred    Target Date  01/04/19      PT LONG TERM GOAL #2   Title  Patient will report no increased pain with meal prep activities to indicate improved pain response with repeated flexion.    Baseline  9-10/10 01/04/2019: Deferred   Time  6    Period  Weeks    Status  Deferred   Target Date  01/04/19      PT LONG TERM GOAL #3   Title  Patient will report ability to perform 5 sit <> stand transfers  without increase in pain and with good technique to demonstrate reduced pain with transfers.    Baseline  Hip adduction/UE assist with sit <> stand; reports increase in pain 01/04/2019: Deferred   Time  6    Period  Weeks    Status  Deferred   Target Date  01/04/19            Plan - 01/04/19 1231    Clinical Impression Statement  Patient demonstrates painful spasm with lower lumbar flexion moments, eased with manual therapy and gentle exercise. Note increased tissue tension in lumbar paraspinals but good segmental mobility through lumbar spine. Remaining deficits primarily in posterior pelvic tilt and pelvic girdle stability with flexion movements and use of hip musculature for extension to reduce stress on lumbar spine. Patient will benefit from skilled physical therapy to reduce LBP with functional activities and return to PLOF.    Personal Factors and Comorbidities  Comorbidity 1;Comorbidity 2;Comorbidity 3+;Age    Comorbidities  HTN, Hyperlipidemia, Cancer    Examination-Activity Limitations  Carry;Stand;Stairs;Locomotion Level;Bend;Lift;Squat;Sit    Examination-Participation Restrictions  Cleaning;Laundry;Meal Prep    Stability/Clinical Decision Making  Stable/Uncomplicated    Rehab Potential  Good    PT Frequency  2x / week    PT Duration  6 weeks    PT Treatment/Interventions  ADLs/Self Care Home Management;Moist Heat;Functional mobility training;Therapeutic activities;Therapeutic exercise;Patient/family education;Neuromuscular re-education;Balance training;Stair training;Manual techniques;Passive range of motion;Joint Manipulations;Taping;Dry needling;Electrical Stimulation;Cryotherapy    PT Next Visit Plan  Progress asymmetrical/multiplanar therex for multifidi, hip stabilization, extension power    PT Home Exercise Plan  Running man, resisted rotation walk outs    Consulted and Agree with Plan of Care  Patient       Patient will benefit from skilled therapeutic intervention  in order to improve the following deficits and impairments:  Decreased balance, Decreased endurance, Decreased mobility, Difficulty walking, Hypomobility, Increased muscle spasms, Impaired sensation, Decreased range of motion, Improper body mechanics, Pain, Impaired flexibility, Decreased strength, Decreased activity tolerance, Abnormal gait  Visit Diagnosis: Chronic right-sided low back pain without sciatica  Muscle weakness (generalized)     Problem List Patient Active Problem List   Diagnosis Date Noted  . Overactive bladder 12/23/2018  . PAT (paroxysmal atrial tachycardia) ([place]) 09/30/2018  . Adult idiopathic generalized osteoporosis 06/04/2018  . OSA on CPAP 03/29/2018  . Recurrent UTI 12/27/2017  . Renal cyst 12/27/2017  . Rotator cuff tear, right 10/02/2017  . Complete rotator cuff tear 10/02/2017  . Tubular adenoma 05/27/2017  . Spondylolisthesis 05/13/2017  . Spinal stenosis of lumbar region 05/13/2017  . Neuritis of saphenous nerve 05/13/2017  . Lumbar radiculopathy 05/13/2017  . Essential hypertension 07/31/2016  . Renal artery stenosis ([place]) 07/31/2016  . DJD (degenerative joint disease) 07/31/2016  . Low serum vitamin D 07/09/2015  . Distal radius fracture, right 06/30/2015  . Hyperlipidemia, mixed 04/10/2015  . History of malignant melanoma 02/13/2014  . Pulmonary emboli ([place]) 06/17/2013  . Pernicious anemia 06/17/2013  . Lumbar disc disease 06/17/2013  . Cervical disc disease 06/17/2013  . Basal cell carcinoma of right cheek 04/28/2013  . S/P left TKA 08/11/2011    Virgia Land, SPT 01/04/2019, 12:35 PM  [facility] [address], [place], [address] Phone: [phone]   Fax:  [phone]  Name: ANNAH SLAPPEY MRN: [id number] Date of Birth: 04/23/1934

## 2019-01-10 ENCOUNTER — Ambulatory Visit: Payer: Medicare Other | Attending: Internal Medicine

## 2019-01-10 DIAGNOSIS — Z20822 Contact with and (suspected) exposure to covid-19: Secondary | ICD-10-CM

## 2019-01-11 ENCOUNTER — Ambulatory Visit: Payer: Medicare Other

## 2019-01-11 ENCOUNTER — Other Ambulatory Visit: Payer: Self-pay

## 2019-01-11 DIAGNOSIS — G8929 Other chronic pain: Secondary | ICD-10-CM

## 2019-01-11 DIAGNOSIS — M6281 Muscle weakness (generalized): Secondary | ICD-10-CM

## 2019-01-11 DIAGNOSIS — M545 Low back pain, unspecified: Secondary | ICD-10-CM

## 2019-01-11 NOTE — Therapy (Signed)
[facility] [address] [place], [address] Phone: [phone]   Fax:  [phone]  Physical Therapy Treatment  Patient Details  Name: Anita Bailey MRN: [id number] Date of Birth: 1934-09-21 Referring Provider (PT): Vladimir Crofts, MD   Encounter Date: 01/11/2019  PT End of Session - 01/11/19 1732    Visit Number  9    Number of Visits  13    Date for PT Re-Evaluation  01/04/19    Authorization Type  9 / 10    PT Start Time  1503    PT Stop Time  1546    PT Time Calculation (min)  43 min    Activity Tolerance  Patient tolerated treatment well;No increased pain    Behavior During Therapy  WFL for tasks assessed/performed       Past Medical History:  Diagnosis Date  . Anemia   . Arthritis   . Cancer ([place])    hx of melanoma in 1971, basal cell   . Cervical disc disease   . Chronic kidney disease    current uti 08/05/11   . Depression   . DVT (deep venous thrombosis) ([place]) 01/2015   was Eliquis until 06/13/15-   . Dysrhythmia    hx of heart racing - in past, "none  in years".  had a stress test > 7 years  . History of recurrent UTIs    "bladder infection"  Had a PICC line in past  . Hyperlipidemia   . Hypertension   . Melanoma ([place])   . Nephrolithiasis   . Pulmonary emboli ([facility])    bilateral 8/12 hospitalized   . Vitamin D deficiency     Past Surgical History:  Procedure Laterality Date  . ABDOMINAL HYSTERECTOMY     1981  . BACK SURGERY    . BREAST EXCISIONAL BIOPSY Right 1979   Negative  . COLONOSCOPY W/ POLYPECTOMY    . COLONOSCOPY WITH PROPOFOL N/A 04/29/2017   Procedure: COLONOSCOPY WITH PROPOFOL;  Surgeon: [name], MD;  Location: [facility] ENDOSCOPY;  Service: Endoscopy;  Laterality: N/A;  . JOINT REPLACEMENT     bilateral knee  . left wrist      surgery x 2   . OPEN REDUCTION INTERNAL FIXATION (ORIF) DISTAL RADIAL FRACTURE Right 06/30/2015   Procedure: OPEN REDUCTION INTERNAL  FIXATION (ORIF) RIGHT DISTAL RADIUS FRACTURE ;  Surgeon: [name], MD;  Location: [facility];  Service: Orthopedics;  Laterality: Right;  . OTHER SURGICAL HISTORY Right    leg - melonoma  . SHOULDER ARTHROSCOPY WITH OPEN ROTATOR CUFF REPAIR Right 10/02/2017   Procedure: Right shoulder mini open rotator cuff repair;  Surgeon: Susa Day, MD;  Location: [facility];  Service: Orthopedics;  Laterality: Right;  90 mins  . TONSILLECTOMY    . TOTAL KNEE ARTHROPLASTY  08/11/2011   Procedure: TOTAL KNEE ARTHROPLASTY;  Surgeon: [name], MD;  Location: [facility];  Service: Orthopedics;  Laterality: Left;  . TOTAL KNEE ARTHROPLASTY Left 2010    There were no vitals filed for this visit.  Subjective Assessment - 01/11/19 1503    Subjective  Patient reports that epidural has worn off. Reports that painful catch is still present and radicular pain down RLE has resumed.    Pertinent History  LBP. Pain onset 4-5 years ago insidiously. Patient has received multiple epidurals for managing the pain with good results. Patient reports MRI positive for pinched nerve L3-4 and L5-S1. Attended  physical therapy 9 visits with some improvement. Agg: flexion activities, sit <> stand, transfers. Ease: moving out of agg position; moving slowly and carefully. Reports HTN, Hyperlipidemia, Cancer (melanoma dx 1970), 2006 lumbar decompression for nerve pain    Limitations  Lifting;House hold activities    How long can you sit comfortably?  indefinitely but pain during transfer    How long can you stand comfortably?  indefinitely    How long can you walk comfortably?  indefinitely    Diagnostic tests  MRI positive as described above    Patient Stated Goals  Improve core strength; reduce pain    Currently in Pain?  Yes    Pain Score  9     Pain Location  Back    Pain Orientation  Right;Left;Lower    Pain Descriptors / Indicators  Aching    Pain Type  Neuropathic pain;Chronic pain    Pain Radiating Towards  R buttock and  leg    Pain Onset  More than a month ago    Pain Frequency  Intermittent    Aggravating Factors   Seated    Pain Relieving Factors  Standing    Multiple Pain Sites  No        TREATMENT  MT QL, paraspinals, R PA mobs grade 3-4 T11-T7  TE Prone press-ups 2 x 10 - note deficits in combined extension, mild hinging at thoracolumbar junction Bridges x 10 - cues for avoiding UE support Towel-assisted SNAG for thoracic spine 5 x 5  TE for increased thoracic extension to reduce compression on IVD and radicular symptoms   TA Supine <> sitting EOB   STS 2 x 5 - cues for increased extension to avoid compression of IVD and radicular symptoms       PT Education - 01/11/19 1731    Education Details  form/technique with exercise; radicular pathology due to lumbar disc derangement    Person(s) Educated  Patient    Methods  Explanation;Demonstration;Tactile cues;Verbal cues    Comprehension  Verbalized understanding;Returned demonstration;Verbal cues required;Tactile cues required       PT Short Term Goals - 11/23/18 1006      PT SHORT TERM GOAL #1   Title  Patient will demonstrate adherence to HEP 3x/wk as adjunct to clinical therapy to speed recovery and reduce total number of visits.    Baseline  HEP given    Time  2    Period  Weeks    Status  New    Target Date  12/07/18        PT Long Term Goals - 01/11/19 1537      PT LONG TERM GOAL #1   Title  Patient will reduce score on MODI by 10% to achieve MCID and demonstrate reduced disability.    Baseline  26%; 01/11/2019 38%    Time  6    Period  Weeks    Status  On-going      PT LONG TERM GOAL #2   Title  Patient will report no increased pain with meal prep activities to indicate improved pain response with repeated flexion.    Baseline  9-10/10; 01/11/2019 9/10;    Time  6    Period  Weeks    Status  On-going      PT LONG TERM GOAL #3   Title  Patient will report ability to perform 5 sit <> stand transfers  without increase in pain and with good technique to demonstrate reduced pain with  transfers.    Baseline  Hip adduction/UE assist with sit <> stand; reports increase in pain; 01/11/2019 able to sit <> stand without UE or pain increase but hip adduction moment present    Time  6    Period  Weeks    Status  On-going            Plan - 01/11/19 1736    Clinical Impression Statement  Patient reports to physical therapy with flare-up of radicular symptoms consistent with disc pathology, accounting for decrements in long-term goals. Patient does demonstrate good retention of instruction in transition EOB <> supine and sit <> stand and is able to perform mobility tasks without increase in pain. Within-session change is promising for eventual resolution of pain. Patient will benefit from additional physical therapy for reduction of pain and return to PLOF.    Personal Factors and Comorbidities  Comorbidity 1;Comorbidity 2;Comorbidity 3+;Age    Comorbidities  HTN, Hyperlipidemia, Cancer    Examination-Activity Limitations  Carry;Stand;Stairs;Locomotion Level;Bend;Lift;Squat;Sit    Examination-Participation Restrictions  Cleaning;Laundry;Meal Prep    Stability/Clinical Decision Making  Stable/Uncomplicated    Rehab Potential  Good    PT Frequency  2x / week    PT Duration  6 weeks    PT Treatment/Interventions  ADLs/Self Care Home Management;Moist Heat;Functional mobility training;Therapeutic activities;Therapeutic exercise;Patient/family education;Neuromuscular re-education;Balance training;Stair training;Manual techniques;Passive range of motion;Joint Manipulations;Taping;Dry needling;Electrical Stimulation;Cryotherapy    PT Next Visit Plan  Progress therex per pt tolerance    PT Home Exercise Plan  Standing extension, thoracic SNAG    Consulted and Agree with Plan of Care  Patient       Patient will benefit from skilled therapeutic intervention in order to improve the following deficits and  impairments:  Decreased balance, Decreased endurance, Decreased mobility, Difficulty walking, Hypomobility, Increased muscle spasms, Impaired sensation, Decreased range of motion, Improper body mechanics, Pain, Impaired flexibility, Decreased strength, Decreased activity tolerance, Abnormal gait  Visit Diagnosis: Chronic right-sided low back pain without sciatica  Muscle weakness (generalized)     Problem List Patient Active Problem List   Diagnosis Date Noted  . Overactive bladder 12/23/2018  . PAT (paroxysmal atrial tachycardia) ([place]) 09/30/2018  . Adult idiopathic generalized osteoporosis 06/04/2018  . OSA on CPAP 03/29/2018  . Recurrent UTI 12/27/2017  . Renal cyst 12/27/2017  . Rotator cuff tear, right 10/02/2017  . Complete rotator cuff tear 10/02/2017  . Tubular adenoma 05/27/2017  . Spondylolisthesis 05/13/2017  . Spinal stenosis of lumbar region 05/13/2017  . Neuritis of saphenous nerve 05/13/2017  . Lumbar radiculopathy 05/13/2017  . Essential hypertension 07/31/2016  . Renal artery stenosis ([place]) 07/31/2016  . DJD (degenerative joint disease) 07/31/2016  . Low serum vitamin D 07/09/2015  . Distal radius fracture, right 06/30/2015  . Hyperlipidemia, mixed 04/10/2015  . History of malignant melanoma 02/13/2014  . Pulmonary emboli ([place]) 06/17/2013  . Pernicious anemia 06/17/2013  . Lumbar disc disease 06/17/2013  . Cervical disc disease 06/17/2013  . Basal cell carcinoma of right cheek 04/28/2013  . S/P left TKA 08/11/2011    Virgia Land, SPT 01/11/2019, 5:45 PM  [facility] [address] [place], [address] Phone: [phone]   Fax:  [phone]  Name: Anita Bailey MRN: [id number] Date of Birth: 1934-11-18

## 2019-01-12 ENCOUNTER — Telehealth: Payer: Self-pay | Admitting: *Deleted

## 2019-01-12 LAB — NOVEL CORONAVIRUS, NAA: SARS-CoV-2, NAA: NOT DETECTED

## 2019-01-12 NOTE — Telephone Encounter (Signed)
Patient called ,given negative covid results.

## 2019-01-13 ENCOUNTER — Ambulatory Visit: Payer: Medicare Other

## 2019-01-13 ENCOUNTER — Other Ambulatory Visit: Payer: Self-pay

## 2019-01-13 DIAGNOSIS — G8929 Other chronic pain: Secondary | ICD-10-CM

## 2019-01-13 DIAGNOSIS — M6281 Muscle weakness (generalized): Secondary | ICD-10-CM

## 2019-01-13 DIAGNOSIS — M545 Low back pain, unspecified: Secondary | ICD-10-CM

## 2019-01-13 NOTE — Therapy (Signed)
[facility] [address] [place], [address] Phone: [phone]   Fax:  [phone]  Physical Therapy Treatment  Patient Details  Name: Anita Bailey MRN: [id number] Date of Birth: [birth year] Referring Provider (PT): Vladimir Crofts, MD   Encounter Date: 01/13/2019  PT End of Session - 01/13/19 1522    Visit Number  10    Number of Visits  13    Date for PT Re-Evaluation  01/04/19    Authorization Type  10 / 10    PT Start Time  [id number]    PT Stop Time  1600    PT Time Calculation (min)  41 min    Activity Tolerance  Patient tolerated treatment well;No increased pain    Behavior During Therapy  WFL for tasks assessed/performed       Past Medical History:  Diagnosis Date  . Anemia   . Arthritis   . Cancer ([place])    hx of melanoma in 1971, basal cell   . Cervical disc disease   . Chronic kidney disease    current uti 08/05/11   . Depression   . DVT (deep venous thrombosis) ([place]) 01/2015   was Eliquis until 06/13/15-   . Dysrhythmia    hx of heart racing - in past, "none  in years".  had a stress test > 7 years  . History of recurrent UTIs    "bladder infection"  Had a PICC line in past  . Hyperlipidemia   . Hypertension   . Melanoma ([place])   . Nephrolithiasis   . Pulmonary emboli ([facility])    bilateral 8/12 hospitalized   . Vitamin D deficiency     Past Surgical History:  Procedure Laterality Date  . ABDOMINAL HYSTERECTOMY     1981  . BACK SURGERY    . BREAST EXCISIONAL BIOPSY Right 1979   Negative  . COLONOSCOPY W/ POLYPECTOMY    . COLONOSCOPY WITH PROPOFOL N/A 04/29/2017   Procedure: COLONOSCOPY WITH PROPOFOL;  Surgeon: [name], MD;  Location: [facility] ENDOSCOPY;  Service: Endoscopy;  Laterality: N/A;  . JOINT REPLACEMENT     bilateral knee  . left wrist      surgery x 2   . OPEN REDUCTION INTERNAL FIXATION (ORIF) DISTAL RADIAL FRACTURE Right 06/30/2015   Procedure: OPEN REDUCTION  INTERNAL FIXATION (ORIF) RIGHT DISTAL RADIUS FRACTURE ;  Surgeon: [name], MD;  Location: [facility];  Service: Orthopedics;  Laterality: Right;  . OTHER SURGICAL HISTORY Right    leg - melonoma  . SHOULDER ARTHROSCOPY WITH OPEN ROTATOR CUFF REPAIR Right 10/02/2017   Procedure: Right shoulder mini open rotator cuff repair;  Surgeon: Susa Day, MD;  Location: [facility];  Service: Orthopedics;  Laterality: Right;  90 mins  . TONSILLECTOMY    . TOTAL KNEE ARTHROPLASTY  08/11/2011   Procedure: TOTAL KNEE ARTHROPLASTY;  Surgeon: [name], MD;  Location: [facility];  Service: Orthopedics;  Laterality: Left;  . TOTAL KNEE ARTHROPLASTY Left 2010    There were no vitals filed for this visit.  Subjective Assessment - 01/13/19 1524    Subjective  Patient reports visiting MD and pain flared up due to positioning changes.    Pertinent History  LBP. Pain onset 4-5 years ago insidiously. Patient has received multiple epidurals for managing the pain with good results. Patient reports MRI positive for pinched nerve L3-4 and L5-S1. Attended physical therapy 9 visits with some improvement. Agg: flexion  activities, sit <> stand, transfers. Ease: moving out of agg position; moving slowly and carefully. Reports HTN, Hyperlipidemia, Cancer (melanoma dx 1970), 2006 lumbar decompression for nerve pain    Limitations  Lifting;House hold activities    How long can you sit comfortably?  indefinitely but pain during transfer    How long can you stand comfortably?  indefinitely    How long can you walk comfortably?  indefinitely    Diagnostic tests  MRI positive as described above    Patient Stated Goals  Improve core strength; reduce pain    Currently in Pain?  Yes    Pain Score  8     Pain Location  Hip    Pain Orientation  Right    Pain Descriptors / Indicators  Aching;Shooting    Pain Type  Chronic pain;Neuropathic pain    Pain Onset  More than a month ago       TREATMENT  MT Myofascial release and  trigger point release to QL R, paraspinals R/L CPA mobilization T7-T12 grade 3-4   TE Prone press-ups 2 x 10 - concurrent with therapist pressure at T11, T 10, T9 Seated thoracic rotation R/L x 10 for gentle mobilization of lumbar musculature "Open books" supine hooklying thoracic rotation with BUE abducted to 90 Bridges without UE assist x 10  Lateral step ups R/L x 10   PT Education - 01/13/19 1520    Education Details  form/technique with exercise    Methods  Explanation;Demonstration;Tactile cues;Verbal cues    Comprehension  Verbalized understanding;Returned demonstration;Verbal cues required;Tactile cues required       PT Short Term Goals - 01/11/19 1756      PT SHORT TERM GOAL #1   Title  Patient will demonstrate adherence to HEP 3x/wk as adjunct to clinical therapy to speed recovery and reduce total number of visits.    Baseline  HEP given    Time  2    Period  Weeks    Status  Achieved    Target Date  12/07/18        PT Long Term Goals - 01/11/19 1537      PT LONG TERM GOAL #1   Title  Patient will reduce score on MODI by 10% to achieve MCID and demonstrate reduced disability.    Baseline  26%; 01/11/2019 38%    Time  6    Period  Weeks    Status  On-going      PT LONG TERM GOAL #2   Title  Patient will report no increased pain with meal prep activities to indicate improved pain response with repeated flexion.    Baseline  9-10/10; 01/11/2019 9/10;    Time  6    Period  Weeks    Status  On-going      PT LONG TERM GOAL #3   Title  Patient will report ability to perform 5 sit <> stand transfers without increase in pain and with good technique to demonstrate reduced pain with transfers.    Baseline  Hip adduction/UE assist with sit <> stand; reports increase in pain; 01/11/2019 able to sit <> stand without UE or pain increase but hip adduction moment present    Time  6    Period  Weeks    Status  On-going            Plan - 01/13/19 1721    Clinical  Impression Statement  Patient c/o pain reduced with manual therapy and gentle rotational  therex. Pt report of no radicular symptoms is reassuring suggesting reduced derangement of disc. Examination reveals moderately increased tissue tension in R lumbar musculature localized to QL, concordant on palpation; pain reduced with manual therapy. Patient demonstrates good retention of earlier instruction in sitting <> sidelying transfers for reduced stress on lumbar spine. Note aberrant motor patterning with ambulation and therex likely related to poor hip girdle strength. Patient will benefit from additional skilled physical therapy for reduction of pain and return to PLOF.    Personal Factors and Comorbidities  Comorbidity 1;Comorbidity 2;Comorbidity 3+;Age    Comorbidities  HTN, Hyperlipidemia, Cancer    Examination-Activity Limitations  Carry;Stand;Stairs;Locomotion Level;Bend;Lift;Squat;Sit    Examination-Participation Restrictions  Cleaning;Laundry;Meal Prep    Stability/Clinical Decision Making  Stable/Uncomplicated    Rehab Potential  Good    PT Frequency  2x / week    PT Duration  6 weeks    PT Treatment/Interventions  ADLs/Self Care Home Management;Moist Heat;Functional mobility training;Therapeutic activities;Therapeutic exercise;Patient/family education;Neuromuscular re-education;Balance training;Stair training;Manual techniques;Passive range of motion;Joint Manipulations;Taping;Dry needling;Electrical Stimulation;Cryotherapy    PT Next Visit Plan  Progress therex per pt tolerance    PT Home Exercise Plan  Standing extension, thoracic SNAG    Consulted and Agree with Plan of Care  Patient       Patient will benefit from skilled therapeutic intervention in order to improve the following deficits and impairments:  Decreased balance, Decreased endurance, Decreased mobility, Difficulty walking, Hypomobility, Increased muscle spasms, Impaired sensation, Decreased range of motion, Improper body  mechanics, Pain, Impaired flexibility, Decreased strength, Decreased activity tolerance, Abnormal gait  Visit Diagnosis: Chronic right-sided low back pain without sciatica  Muscle weakness (generalized)     Problem List Patient Active Problem List   Diagnosis Date Noted  . Overactive bladder 12/23/2018  . PAT (paroxysmal atrial tachycardia) ([place]) 09/30/2018  . Adult idiopathic generalized osteoporosis 06/04/2018  . OSA on CPAP 03/29/2018  . Recurrent UTI 12/27/2017  . Renal cyst 12/27/2017  . Rotator cuff tear, right 10/02/2017  . Complete rotator cuff tear 10/02/2017  . Tubular adenoma 05/27/2017  . Spondylolisthesis 05/13/2017  . Spinal stenosis of lumbar region 05/13/2017  . Neuritis of saphenous nerve 05/13/2017  . Lumbar radiculopathy 05/13/2017  . Essential hypertension 07/31/2016  . Renal artery stenosis ([place]) 07/31/2016  . DJD (degenerative joint disease) 07/31/2016  . Low serum vitamin D 07/09/2015  . Distal radius fracture, right 06/30/2015  . Hyperlipidemia, mixed 04/10/2015  . History of malignant melanoma 02/13/2014  . Pulmonary emboli ([place]) 06/17/2013  . Pernicious anemia 06/17/2013  . Lumbar disc disease 06/17/2013  . Cervical disc disease 06/17/2013  . Basal cell carcinoma of right cheek 04/28/2013  . S/P left TKA 08/11/2011    Anita Bailey, SPT 01/13/2019, 5:25 PM  [facility] [address] [place], [address] Phone: [phone]   Fax:  [phone]  Name: Anita Bailey MRN: [id number] Date of Birth: Jan 09, 1935

## 2019-01-18 ENCOUNTER — Ambulatory Visit: Payer: Medicare Other | Attending: Specialist

## 2019-01-18 ENCOUNTER — Other Ambulatory Visit: Payer: Self-pay

## 2019-01-18 DIAGNOSIS — G8929 Other chronic pain: Secondary | ICD-10-CM

## 2019-01-18 DIAGNOSIS — M6281 Muscle weakness (generalized): Secondary | ICD-10-CM | POA: Insufficient documentation

## 2019-01-18 DIAGNOSIS — M545 Low back pain, unspecified: Secondary | ICD-10-CM

## 2019-01-18 NOTE — Therapy (Signed)
[facility] [address], [place], [address] Phone: [phone]   Fax:  [phone]  Physical Therapy Treatment  Patient Details  Name: Anita Bailey MRN: [id number] Date of Birth: 16-Nov-1934 Referring Provider (PT): Vladimir Crofts, MD   Encounter Date: 01/18/2019  PT End of Session - 01/18/19 1546    Visit Number  11    Number of Visits  13    Date for PT Re-Evaluation  01/04/19    Authorization Type  1/10    PT Start Time  [id number]    PT Stop Time  1600    PT Time Calculation (min)  42 min    Activity Tolerance  Patient tolerated treatment well;No increased pain    Behavior During Therapy  WFL for tasks assessed/performed       Past Medical History:  Diagnosis Date  . Anemia   . Arthritis   . Cancer ([place])    hx of melanoma in 1971, basal cell   . Cervical disc disease   . Chronic kidney disease    current uti 08/05/11   . Depression   . DVT (deep venous thrombosis) ([place]) 01/2015   was Eliquis until 06/13/15-   . Dysrhythmia    hx of heart racing - in past, "none  in years".  had a stress test > 7 years  . History of recurrent UTIs    "bladder infection"  Had a PICC line in past  . Hyperlipidemia   . Hypertension   . Melanoma ([place])   . Nephrolithiasis   . Pulmonary emboli ([facility])    bilateral 8/12 hospitalized   . Vitamin D deficiency     Past Surgical History:  Procedure Laterality Date  . ABDOMINAL HYSTERECTOMY     1981  . BACK SURGERY    . BREAST EXCISIONAL BIOPSY Right 1979   Negative  . COLONOSCOPY W/ POLYPECTOMY    . COLONOSCOPY WITH PROPOFOL N/A 04/29/2017   Procedure: COLONOSCOPY WITH PROPOFOL;  Surgeon: [name], MD;  Location: [facility] ENDOSCOPY;  Service: Endoscopy;  Laterality: N/A;  . JOINT REPLACEMENT     bilateral knee  . left wrist      surgery x 2   . OPEN REDUCTION INTERNAL FIXATION (ORIF) DISTAL RADIAL FRACTURE Right 06/30/2015   Procedure: OPEN REDUCTION INTERNAL  FIXATION (ORIF) RIGHT DISTAL RADIUS FRACTURE ;  Surgeon: [name], MD;  Location: [facility] [facility];  Service: Orthopedics;  Laterality: Right;  . OTHER SURGICAL HISTORY Right    leg - melonoma  . SHOULDER ARTHROSCOPY WITH OPEN ROTATOR CUFF REPAIR Right 10/02/2017   Procedure: Right shoulder mini open rotator cuff repair;  Surgeon: Susa Day, MD;  Location: [facility];  Service: Orthopedics;  Laterality: Right;  90 mins  . TONSILLECTOMY    . TOTAL KNEE ARTHROPLASTY  08/11/2011   Procedure: TOTAL KNEE ARTHROPLASTY;  Surgeon: [name], MD;  Location: [facility];  Service: Orthopedics;  Laterality: Left;  . TOTAL KNEE ARTHROPLASTY Left 2010    There were no vitals filed for this visit.  Subjective Assessment - 01/18/19 1523    Subjective  Patient reports she had to take a half of a hydrocodone this am. Patient reports she is unable to bend.    Pertinent History  LBP. Pain onset 4-5 years ago insidiously. Patient has received multiple epidurals for managing the pain with good results. Patient reports MRI positive for pinched nerve L3-4 and L5-S1. Attended physical therapy 9  visits with some improvement. Agg: flexion activities, sit <> stand, transfers. Ease: moving out of agg position; moving slowly and carefully. Reports HTN, Hyperlipidemia, Cancer (melanoma dx 1970), 2006 lumbar decompression for nerve pain    Limitations  Lifting;House hold activities    How long can you sit comfortably?  indefinitely but pain during transfer    How long can you stand comfortably?  indefinitely    How long can you walk comfortably?  indefinitely    Diagnostic tests  MRI positive as described above    Patient Stated Goals  Improve core strength; reduce pain    Currently in Pain?  Yes    Pain Score  1     Pain Location  Hip    Pain Orientation  Right    Pain Descriptors / Indicators  Aching    Pain Type  Chronic pain;Neuropathic pain    Pain Onset  More than a month ago    Pain Frequency  Intermittent        TREATMENT Therapeutic Exercise: Prone Cobra - x 20  Prone cobra with OP - x 5  Standing Lumbar extension in standing - x 10  Sit to stands - x 10  Thoracic extension with SNAG towel - x 10  SL Romanian deadlift - x 10  Performed exercises to improve strength with squatting and bending motions  PT Education - 01/18/19 1534    Education Details  form/technique with exercise    Person(s) Educated  Patient    Methods  Explanation;Demonstration    Comprehension  Verbalized understanding;Returned demonstration       PT Short Term Goals - 01/11/19 1756      PT SHORT TERM GOAL #1   Title  Patient will demonstrate adherence to HEP 3x/wk as adjunct to clinical therapy to speed recovery and reduce total number of visits.    Baseline  HEP given    Time  2    Period  Weeks    Status  Achieved    Target Date  12/07/18        PT Long Term Goals - 01/11/19 1537      PT LONG TERM GOAL #1   Title  Patient will reduce score on MODI by 10% to achieve MCID and demonstrate reduced disability.    Baseline  26%; 01/11/2019 38%    Time  6    Period  Weeks    Status  On-going      PT LONG TERM GOAL #2   Title  Patient will report no increased pain with meal prep activities to indicate improved pain response with repeated flexion.    Baseline  9-10/10; 01/11/2019 9/10;    Time  6    Period  Weeks    Status  On-going      PT LONG TERM GOAL #3   Title  Patient will report ability to perform 5 sit <> stand transfers without increase in pain and with good technique to demonstrate reduced pain with transfers.    Baseline  Hip adduction/UE assist with sit <> stand; reports increase in pain; 01/11/2019 able to sit <> stand without UE or pain increase but hip adduction moment present    Time  6    Period  Weeks    Status  On-going            Plan - 01/18/19 1555    Clinical Impression Statement  Patient demonstrates improvement with bending most notably with golfers lift/single leg  RDL. Patient able to lift items off the floor without an increase in pain using the golfers lift instructed patient on how to do this at home. Patient will benefit from further skilled therapy focused on improving limitations to return to prior level of function.    Personal Factors and Comorbidities  Comorbidity 1;Comorbidity 2;Comorbidity 3+;Age    Comorbidities  HTN, Hyperlipidemia, Cancer    Examination-Activity Limitations  Carry;Stand;Stairs;Locomotion Level;Bend;Lift;Squat;Sit    Examination-Participation Restrictions  Cleaning;Laundry;Meal Prep    Stability/Clinical Decision Making  Stable/Uncomplicated    Rehab Potential  Good    PT Frequency  2x / week    PT Duration  6 weeks    PT Treatment/Interventions  ADLs/Self Care Home Management;Moist Heat;Functional mobility training;Therapeutic activities;Therapeutic exercise;Patient/family education;Neuromuscular re-education;Balance training;Stair training;Manual techniques;Passive range of motion;Joint Manipulations;Taping;Dry needling;Electrical Stimulation;Cryotherapy    PT Next Visit Plan  Progress therex per pt tolerance    PT Home Exercise Plan  Standing extension, thoracic SNAG    Consulted and Agree with Plan of Care  Patient       Patient will benefit from skilled therapeutic intervention in order to improve the following deficits and impairments:  Decreased balance, Decreased endurance, Decreased mobility, Difficulty walking, Hypomobility, Increased muscle spasms, Impaired sensation, Decreased range of motion, Improper body mechanics, Pain, Impaired flexibility, Decreased strength, Decreased activity tolerance, Abnormal gait  Visit Diagnosis: Chronic right-sided low back pain without sciatica  Muscle weakness (generalized)     Problem List Patient Active Problem List   Diagnosis Date Noted  . Overactive bladder 12/23/2018  . PAT (paroxysmal atrial tachycardia) ([place]) 09/30/2018  . Adult idiopathic generalized osteoporosis  06/04/2018  . OSA on CPAP 03/29/2018  . Recurrent UTI 12/27/2017  . Renal cyst 12/27/2017  . Rotator cuff tear, right 10/02/2017  . Complete rotator cuff tear 10/02/2017  . Tubular adenoma 05/27/2017  . Spondylolisthesis 05/13/2017  . Spinal stenosis of lumbar region 05/13/2017  . Neuritis of saphenous nerve 05/13/2017  . Lumbar radiculopathy 05/13/2017  . Essential hypertension 07/31/2016  . Renal artery stenosis ([place]) 07/31/2016  . DJD (degenerative joint disease) 07/31/2016  . Low serum vitamin D 07/09/2015  . Distal radius fracture, right 06/30/2015  . Hyperlipidemia, mixed 04/10/2015  . History of malignant melanoma 02/13/2014  . Pulmonary emboli ([place]) 06/17/2013  . Pernicious anemia 06/17/2013  . Lumbar disc disease 06/17/2013  . Cervical disc disease 06/17/2013  . Basal cell carcinoma of right cheek 04/28/2013  . S/P left TKA 08/11/2011    Blythe Stanford, PT DPT 01/18/2019, 4:51 PM  [facility] [address] [place], [address] Phone: [phone]   Fax:  [phone]  Name: Anita Bailey MRN: [id number] Date of Birth: July 01, 1934

## 2019-01-20 ENCOUNTER — Ambulatory Visit: Payer: Medicare Other

## 2019-01-24 ENCOUNTER — Other Ambulatory Visit: Payer: Self-pay

## 2019-01-24 ENCOUNTER — Ambulatory Visit: Payer: Medicare Other

## 2019-01-24 DIAGNOSIS — G8929 Other chronic pain: Secondary | ICD-10-CM

## 2019-01-24 DIAGNOSIS — M6281 Muscle weakness (generalized): Secondary | ICD-10-CM

## 2019-01-24 DIAGNOSIS — M545 Low back pain, unspecified: Secondary | ICD-10-CM

## 2019-01-24 NOTE — Therapy (Signed)
[facility] [address], [place], [address] Phone: [phone]   Fax:  [phone]  Physical Therapy Treatment  Patient Details  Name: Anita Bailey MRN: [id number] Date of Birth: 1934-03-13 Referring Provider (PT): Vladimir Crofts, MD   Encounter Date: 01/24/2019  PT End of Session - 01/24/19 1047    Visit Number  12    Number of Visits  13    Date for PT Re-Evaluation  01/04/19    Authorization Type  2/10    PT Start Time  [id number]    PT Stop Time  1115    PT Time Calculation (min)  33 min    Activity Tolerance  Patient tolerated treatment well;No increased pain    Behavior During Therapy  WFL for tasks assessed/performed       Past Medical History:  Diagnosis Date  . Anemia   . Arthritis   . Cancer ([place])    hx of melanoma in 1971, basal cell   . Cervical disc disease   . Chronic kidney disease    current uti 08/05/11   . Depression   . DVT (deep venous thrombosis) ([place]) 01/2015   was Eliquis until 06/13/15-   . Dysrhythmia    hx of heart racing - in past, "none  in years".  had a stress test > 7 years  . History of recurrent UTIs    "bladder infection"  Had a PICC line in past  . Hyperlipidemia   . Hypertension   . Melanoma ([place])   . Nephrolithiasis   . Pulmonary emboli ([facility])    bilateral 8/12 hospitalized   . Vitamin D deficiency     Past Surgical History:  Procedure Laterality Date  . ABDOMINAL HYSTERECTOMY     1981  . BACK SURGERY    . BREAST EXCISIONAL BIOPSY Right 1979   Negative  . COLONOSCOPY W/ POLYPECTOMY    . COLONOSCOPY WITH PROPOFOL N/A 04/29/2017   Procedure: COLONOSCOPY WITH PROPOFOL;  Surgeon: [name], MD;  Location: [facility]COPY;  Service: Endoscopy;  Laterality: N/A;  . JOINT REPLACEMENT     bilateral knee  . left wrist      surgery x 2   . OPEN REDUCTION INTERNAL FIXATION (ORIF) DISTAL RADIAL FRACTURE Right 06/30/2015   Procedure: OPEN REDUCTION INTERNAL  FIXATION (ORIF) RIGHT DISTAL RADIUS FRACTURE ;  Surgeon: [name], MD;  Location: [facility];  Service: Orthopedics;  Laterality: Right;  . OTHER SURGICAL HISTORY Right    leg - melonoma  . SHOULDER ARTHROSCOPY WITH OPEN ROTATOR CUFF REPAIR Right 10/02/2017   Procedure: Right shoulder mini open rotator cuff repair;  Surgeon: Susa Day, MD;  Location: [facility];  Service: Orthopedics;  Laterality: Right;  90 mins  . TONSILLECTOMY    . TOTAL KNEE ARTHROPLASTY  08/11/2011   Procedure: TOTAL KNEE ARTHROPLASTY;  Surgeon: [name], MD;  Location: [facility];  Service: Orthopedics;  Laterality: Left;  . TOTAL KNEE ARTHROPLASTY Left 2010    There were no vitals filed for this visit.  Subjective Assessment - 01/24/19 1043    Subjective  Patient reports that she has not done much bending. Reports cessation of radicular pain but still has pain along outside of LLE lower leg. Still gets pain with sit <> stand/    Pertinent History  LBP. Pain onset 4-5 years ago insidiously. Patient has received multiple epidurals for managing the pain with good results. Patient reports MRI  positive for pinched nerve L3-4 and L5-S1. Attended physical therapy 9 visits with some improvement. Agg: flexion activities, sit <> stand, transfers. Ease: moving out of agg position; moving slowly and carefully. Reports HTN, Hyperlipidemia, Cancer (melanoma dx 1970), 2006 lumbar decompression for nerve pain    Limitations  Lifting;House hold activities    How long can you sit comfortably?  indefinitely but pain during transfer    How long can you stand comfortably?  indefinitely    How long can you walk comfortably?  indefinitely    Diagnostic tests  MRI positive as described above    Patient Stated Goals  Improve core strength; reduce pain    Currently in Pain?  No/denies    Pain Onset  More than a month ago       TREATMENT   MT Note increased tension and TTP 2+ in R QL, obliques at superior attachment  Soft tissue  mobilization and myofascial release x 10 min to R QL, obliques, paraspinals for pain relief and inhibition   TE Review of supine <> sidelying <> sitting EOB transfers with cues for greater UE assist and low back stabilization for reduction of pain with transfers Prayer stretch with sidebending for QL stretch x 30 sec each R/L  Standing side body stretch with BUE supported x 30 sec R/L SLS quad extension 4" step with contralateral leg floating, cues for driving through standing LE for improved lumbopelvic alignment and hip recruitment x 10 R/L  TE for reduced pain with transfers and hip strengthening to reduce use of lumbar musculature with functional movements                    PT Education - 01/24/19 1046    Person(s) Educated  Patient    Methods  Explanation;Demonstration;Verbal cues    Comprehension  Verbalized understanding;Returned demonstration;Verbal cues required       PT Short Term Goals - 01/11/19 1756      PT SHORT TERM GOAL #1   Title  Patient will demonstrate adherence to HEP 3x/wk as adjunct to clinical therapy to speed recovery and reduce total number of visits.    Baseline  HEP given    Time  2    Period  Weeks    Status  Achieved    Target Date  12/07/18        PT Long Term Goals - 01/11/19 1537      PT LONG TERM GOAL #1   Title  Patient will reduce score on MODI by 10% to achieve MCID and demonstrate reduced disability.    Baseline  26%; 01/11/2019 38%    Time  6    Period  Weeks    Status  On-going      PT LONG TERM GOAL #2   Title  Patient will report no increased pain with meal prep activities to indicate improved pain response with repeated flexion.    Baseline  9-10/10; 01/11/2019 9/10;    Time  6    Period  Weeks    Status  On-going      PT LONG TERM GOAL #3   Title  Patient will report ability to perform 5 sit <> stand transfers without increase in pain and with good technique to demonstrate reduced pain with transfers.     Baseline  Hip adduction/UE assist with sit <> stand; reports increase in pain; 01/11/2019 able to sit <> stand without UE or pain increase but hip adduction moment present  Time  6    Period  Weeks    Status  On-going            Plan - 01/24/19 1131    Clinical Impression Statement  Patient demonstrates improvements in movement quality and retention of golfer's lift and transfers resulting in reduced LBP with functional movements. TTP and tightness in R lumbar musculature noted likely compensatory for poor pelvic control and hip strength. Pt demonstrates good proprioceptive awareness of deficits and is motivated to continue with exercises as she feels these have been helping her. Plan to focus on lumbopelvic stability for reduction of load through low back with transfers. Patient will benefit from skilled physical therapy to reduce pain with ADLs and transfers.    Personal Factors and Comorbidities  Comorbidity 1;Comorbidity 2;Comorbidity 3+;Age    Comorbidities  HTN, Hyperlipidemia, Cancer    Examination-Activity Limitations  Carry;Stand;Stairs;Locomotion Level;Bend;Lift;Squat;Sit    Examination-Participation Restrictions  Cleaning;Laundry;Meal Prep    Stability/Clinical Decision Making  Stable/Uncomplicated    Rehab Potential  Good    PT Frequency  2x / week    PT Duration  6 weeks    PT Treatment/Interventions  ADLs/Self Care Home Management;Moist Heat;Functional mobility training;Therapeutic activities;Therapeutic exercise;Patient/family education;Neuromuscular re-education;Balance training;Stair training;Manual techniques;Passive range of motion;Joint Manipulations;Taping;Dry needling;Electrical Stimulation;Cryotherapy    PT Next Visit Plan  R hip/pelvic depression stabilization    PT Home Exercise Plan  Standing extension, thoracic SNAG    Consulted and Agree with Plan of Care  Patient       Patient will benefit from skilled therapeutic intervention in order to improve the  following deficits and impairments:  Decreased balance, Decreased endurance, Decreased mobility, Difficulty walking, Hypomobility, Increased muscle spasms, Impaired sensation, Decreased range of motion, Improper body mechanics, Pain, Impaired flexibility, Decreased strength, Decreased activity tolerance, Abnormal gait  Visit Diagnosis: Muscle weakness (generalized)  Chronic right-sided low back pain without sciatica     Problem List Patient Active Problem List   Diagnosis Date Noted  . Overactive bladder 12/23/2018  . PAT (paroxysmal atrial tachycardia) ([place]) 09/30/2018  . Adult idiopathic generalized osteoporosis 06/04/2018  . OSA on CPAP 03/29/2018  . Recurrent UTI 12/27/2017  . Renal cyst 12/27/2017  . Rotator cuff tear, right 10/02/2017  . Complete rotator cuff tear 10/02/2017  . Tubular adenoma 05/27/2017  . Spondylolisthesis 05/13/2017  . Spinal stenosis of lumbar region 05/13/2017  . Neuritis of saphenous nerve 05/13/2017  . Lumbar radiculopathy 05/13/2017  . Essential hypertension 07/31/2016  . Renal artery stenosis ([place]) 07/31/2016  . DJD (degenerative joint disease) 07/31/2016  . Low serum vitamin D 07/09/2015  . Distal radius fracture, right 06/30/2015  . Hyperlipidemia, mixed 04/10/2015  . History of malignant melanoma 02/13/2014  . Pulmonary emboli ([place]) 06/17/2013  . Pernicious anemia 06/17/2013  . Lumbar disc disease 06/17/2013  . Cervical disc disease 06/17/2013  . Basal cell carcinoma of right cheek 04/28/2013  . S/P left TKA 08/11/2011    Virgia Land, SPT 01/24/2019, 11:36 AM  [facility] [address], [place], [address] Phone: [phone]   Fax:  [phone]  Name: AVARY BESSIRE MRN: [id number] Date of Birth: 17-Oct-1934

## 2019-01-25 NOTE — Addendum Note (Signed)
Addended by: Blain Pais on: 01/25/2019 11:12 AM   Modules accepted: Orders

## 2019-01-27 ENCOUNTER — Ambulatory Visit: Payer: Medicare Other

## 2019-01-27 ENCOUNTER — Other Ambulatory Visit: Payer: Self-pay

## 2019-01-27 DIAGNOSIS — M6281 Muscle weakness (generalized): Secondary | ICD-10-CM

## 2019-01-27 DIAGNOSIS — M545 Low back pain, unspecified: Secondary | ICD-10-CM

## 2019-01-27 DIAGNOSIS — G8929 Other chronic pain: Secondary | ICD-10-CM

## 2019-01-27 NOTE — Therapy (Signed)
[facility] [address] [place], [address] Phone: [phone]   Fax:  [phone]  Physical Therapy Treatment  Patient Details  Name: Anita Bailey MRN: [id number] Date of Birth: [birth year] Referring Provider (PT): Vladimir Crofts, MD   Encounter Date: 01/27/2019  PT End of Session - 01/27/19 1442    Visit Number  13    Number of Visits  14    Date for PT Re-Evaluation  02/01/19    Authorization Type  3/10    PT Start Time  1341    PT Stop Time  1415    PT Time Calculation (min)  34 min    Activity Tolerance  Patient tolerated treatment well;No increased pain    Behavior During Therapy  WFL for tasks assessed/performed       Past Medical History:  Diagnosis Date  . Anemia   . Arthritis   . Cancer ([place])    hx of melanoma in 1971, basal cell   . Cervical disc disease   . Chronic kidney disease    current uti 08/05/11   . Depression   . DVT (deep venous thrombosis) ([place]) 01/2015   was Eliquis until 06/13/15-   . Dysrhythmia    hx of heart racing - in past, "none  in years".  had a stress test > 7 years  . History of recurrent UTIs    "bladder infection"  Had a PICC line in past  . Hyperlipidemia   . Hypertension   . Melanoma ([place])   . Nephrolithiasis   . Pulmonary emboli ([facility])    bilateral 8/12 hospitalized   . Vitamin D deficiency     Past Surgical History:  Procedure Laterality Date  . ABDOMINAL HYSTERECTOMY     1981  . BACK SURGERY    . BREAST EXCISIONAL BIOPSY Right 1979   Negative  . COLONOSCOPY W/ POLYPECTOMY    . COLONOSCOPY WITH PROPOFOL N/A 04/29/2017   Procedure: COLONOSCOPY WITH PROPOFOL;  Surgeon: [name], MD;  Location: [facility] ENDOSCOPY;  Service: Endoscopy;  Laterality: N/A;  . JOINT REPLACEMENT     bilateral knee  . left wrist      surgery x 2   . OPEN REDUCTION INTERNAL FIXATION (ORIF) DISTAL RADIAL FRACTURE Right 06/30/2015   Procedure: OPEN REDUCTION INTERNAL  FIXATION (ORIF) RIGHT DISTAL RADIUS FRACTURE ;  Surgeon: [name], MD;  Location: [facility];  Service: Orthopedics;  Laterality: Right;  . OTHER SURGICAL HISTORY Right    leg - melonoma  . SHOULDER ARTHROSCOPY WITH OPEN ROTATOR CUFF REPAIR Right 10/02/2017   Procedure: Right shoulder mini open rotator cuff repair;  Surgeon: Susa Day, MD;  Location: [facility];  Service: Orthopedics;  Laterality: Right;  90 mins  . TONSILLECTOMY    . TOTAL KNEE ARTHROPLASTY  08/11/2011   Procedure: TOTAL KNEE ARTHROPLASTY;  Surgeon: [name], MD;  Location: [facility];  Service: Orthopedics;  Laterality: Left;  . TOTAL KNEE ARTHROPLASTY Left 2010    There were no vitals filed for this visit.  Subjective Assessment - 01/27/19 1342    Subjective  Patient reports radicular symptoms down RLE worst in gluteal region. Reports this pain is usually relieved by epidurals but is not as bad as it has been in the past - she feels PT is helping    Pertinent History  LBP. Pain onset 4-5 years ago insidiously. Patient has received multiple epidurals for managing the pain with  good results. Patient reports MRI positive for pinched nerve L3-4 and L5-S1. Attended physical therapy 9 visits with some improvement. Agg: flexion activities, sit <> stand, transfers. Ease: moving out of agg position; moving slowly and carefully. Reports HTN, Hyperlipidemia, Cancer (melanoma dx 1970), 2006 lumbar decompression for nerve pain    Limitations  Lifting;House hold activities    How long can you sit comfortably?  indefinitely but pain during transfer    How long can you stand comfortably?  indefinitely    How long can you walk comfortably?  indefinitely    Diagnostic tests  MRI positive as described above    Patient Stated Goals  Improve core strength; reduce pain    Currently in Pain?  Yes    Pain Score  8     Pain Location  Hip    Pain Orientation  Right    Pain Descriptors / Indicators  Aching    Pain Type  Chronic pain;Neuropathic  pain    Pain Onset  More than a month ago        TREATMENT  MT Ischemic compression and soft tissue mobilization over glute med, glute min R Mobs T5-T8 grade 3 Prone extension with concurrent mobilization over T7, T6, T5 x 3 each MT for increased extension and pain relief   TE Prone extensions x 10 Prone hip extensions x 10 R/L - therapist stabilization of pelvis for reduction of lumbar compensation Standing lumbar extension x 10 with UE support, x 5 without Lateral stepping x 15 R/L CGA for balance Hip/knee SLS extension on 4" step with cues for ipsilateral pelvic depression x 10 R/L - CGA for balance Running man x10 R/L - single UE assist for balance SLS holds x 3 R/L - pt able to achieve 8 sec over L and 6 sec over R   TE for lumbopelvic strengthening and stabilization and hip extensor strengthening for reduction of strain on low back       PT Education - 01/27/19 1438    Education Details  form/technique with exercise    Person(s) Educated  Patient    Methods  Explanation;Demonstration;Tactile cues;Verbal cues    Comprehension  Verbalized understanding;Returned demonstration;Verbal cues required;Tactile cues required       PT Short Term Goals - 01/11/19 1756      PT SHORT TERM GOAL #1   Title  Patient will demonstrate adherence to HEP 3x/wk as adjunct to clinical therapy to speed recovery and reduce total number of visits.    Baseline  HEP given    Time  2    Period  Weeks    Status  Achieved    Target Date  12/07/18        PT Long Term Goals - 01/11/19 1537      PT LONG TERM GOAL #1   Title  Patient will reduce score on MODI by 10% to achieve MCID and demonstrate reduced disability.    Baseline  26%; 01/11/2019 38%    Time  6    Period  Weeks    Status  On-going      PT LONG TERM GOAL #2   Title  Patient will report no increased pain with meal prep activities to indicate improved pain response with repeated flexion.    Baseline  9-10/10; 01/11/2019  9/10;    Time  6    Period  Weeks    Status  On-going      PT LONG TERM GOAL #3   Title  Patient will report ability to perform 5 sit <> stand transfers without increase in pain and with good technique to demonstrate reduced pain with transfers.    Baseline  Hip adduction/UE assist with sit <> stand; reports increase in pain; 01/11/2019 able to sit <> stand without UE or pain increase but hip adduction moment present    Time  6    Period  Weeks    Status  On-going            Plan - 01/27/19 1445    Clinical Impression Statement  Gluteal pain abolished with manual therapy, possibly suggesting musculogenic origin. Note reduced tissue tension through R paraspinals/QL today and improved pelvic girdle control with therex. Pt demonstrates good comprehension of pelvic depression therex and hip extension proprioception. Patient will benefit from additional skilled physical therapy to reduce pain with ADLs and transfers.    Personal Factors and Comorbidities  Comorbidity 1;Comorbidity 2;Comorbidity 3+;Age    Comorbidities  HTN, Hyperlipidemia, Cancer    Examination-Activity Limitations  Carry;Stand;Stairs;Locomotion Level;Bend;Lift;Squat;Sit    Examination-Participation Restrictions  Cleaning;Laundry;Meal Prep    Stability/Clinical Decision Making  Stable/Uncomplicated    Rehab Potential  Good    PT Frequency  2x / week    PT Duration  6 weeks    PT Treatment/Interventions  ADLs/Self Care Home Management;Moist Heat;Functional mobility training;Therapeutic activities;Therapeutic exercise;Patient/family education;Neuromuscular re-education;Balance training;Stair training;Manual techniques;Passive range of motion;Joint Manipulations;Taping;Dry needling;Electrical Stimulation;Cryotherapy    PT Next Visit Plan  advance HEP. progress thoracic mobilization    PT Home Exercise Plan  Standing extension, thoracic SNAG    Consulted and Agree with Plan of Care  Patient       Patient will benefit from  skilled therapeutic intervention in order to improve the following deficits and impairments:  Decreased balance, Decreased endurance, Decreased mobility, Difficulty walking, Hypomobility, Increased muscle spasms, Impaired sensation, Decreased range of motion, Improper body mechanics, Pain, Impaired flexibility, Decreased strength, Decreased activity tolerance, Abnormal gait  Visit Diagnosis: Muscle weakness (generalized)  Chronic right-sided low back pain without sciatica     Problem List Patient Active Problem List   Diagnosis Date Noted  . Overactive bladder 12/23/2018  . PAT (paroxysmal atrial tachycardia) ([place]) 09/30/2018  . Adult idiopathic generalized osteoporosis 06/04/2018  . OSA on CPAP 03/29/2018  . Recurrent UTI 12/27/2017  . Renal cyst 12/27/2017  . Rotator cuff tear, right 10/02/2017  . Complete rotator cuff tear 10/02/2017  . Tubular adenoma 05/27/2017  . Spondylolisthesis 05/13/2017  . Spinal stenosis of lumbar region 05/13/2017  . Neuritis of saphenous nerve 05/13/2017  . Lumbar radiculopathy 05/13/2017  . Essential hypertension 07/31/2016  . Renal artery stenosis ([place]) 07/31/2016  . DJD (degenerative joint disease) 07/31/2016  . Low serum vitamin D 07/09/2015  . Distal radius fracture, right 06/30/2015  . Hyperlipidemia, mixed 04/10/2015  . History of malignant melanoma 02/13/2014  . Pulmonary emboli ([place]) 06/17/2013  . Pernicious anemia 06/17/2013  . Lumbar disc disease 06/17/2013  . Cervical disc disease 06/17/2013  . Basal cell carcinoma of right cheek 04/28/2013  . S/P left TKA 08/11/2011    Anita Bailey, SPT 01/27/2019, 3:01 PM  [facility] [address] [place], [address] Phone: [phone]   Fax:  [phone]  Name: Anita Bailey MRN: [id number] Date of Birth: Jan 02, 1935

## 2019-02-01 ENCOUNTER — Ambulatory Visit: Payer: Medicare Other

## 2019-02-01 ENCOUNTER — Other Ambulatory Visit: Payer: Self-pay

## 2019-02-01 DIAGNOSIS — M545 Low back pain, unspecified: Secondary | ICD-10-CM

## 2019-02-01 DIAGNOSIS — G8929 Other chronic pain: Secondary | ICD-10-CM

## 2019-02-01 DIAGNOSIS — M6281 Muscle weakness (generalized): Secondary | ICD-10-CM

## 2019-02-01 NOTE — Therapy (Signed)
[facility] [address], [place], [address] Phone: [phone]   Fax:  [phone]  Physical Therapy Treatment  Patient Details  Name: Anita Bailey MRN: [id number] Date of Birth: 01/27/1934 Referring Provider (PT): Vladimir Crofts, MD   Encounter Date: 02/01/2019  PT End of Session - 02/01/19 1345    Visit Number  14    Number of Visits  14    Date for PT Re-Evaluation  02/01/19    Authorization Type  4/10    PT Start Time  1350    PT Stop Time  1430    PT Time Calculation (min)  40 min    Activity Tolerance  Patient tolerated treatment well;No increased pain    Behavior During Therapy  WFL for tasks assessed/performed       Past Medical History:  Diagnosis Date  . Anemia   . Arthritis   . Cancer ([place])    hx of melanoma in 1971, basal cell   . Cervical disc disease   . Chronic kidney disease    current uti 08/05/11   . Depression   . DVT (deep venous thrombosis) ([facility]) 01/2015   was Eliquis until 06/13/15-   . Dysrhythmia    hx of heart racing - in past, "none  in years".  had a stress test > 7 years  . History of recurrent UTIs    "bladder infection"  Had a PICC line in past  . Hyperlipidemia   . Hypertension   . Melanoma ([place])   . Nephrolithiasis   . Pulmonary emboli ([facility])    bilateral 8/12 hospitalized   . Vitamin D deficiency     Past Surgical History:  Procedure Laterality Date  . ABDOMINAL HYSTERECTOMY     1981  . BACK SURGERY    . BREAST EXCISIONAL BIOPSY Right 1979   Negative  . COLONOSCOPY W/ POLYPECTOMY    . COLONOSCOPY WITH PROPOFOL N/A 04/29/2017   Procedure: COLONOSCOPY WITH PROPOFOL;  Surgeon: [name], MD;  Location: [facility] ENDOSCOPY;  Service: Endoscopy;  Laterality: N/A;  . JOINT REPLACEMENT     bilateral knee  . left wrist      surgery x 2   . OPEN REDUCTION INTERNAL FIXATION (ORIF) DISTAL RADIAL FRACTURE Right 06/30/2015   Procedure: OPEN REDUCTION INTERNAL  FIXATION (ORIF) RIGHT DISTAL RADIUS FRACTURE ;  Surgeon: [name], MD;  Location: [facility];  Service: Orthopedics;  Laterality: Right;  . OTHER SURGICAL HISTORY Right    leg - melonoma  . SHOULDER ARTHROSCOPY WITH OPEN ROTATOR CUFF REPAIR Right 10/02/2017   Procedure: Right shoulder mini open rotator cuff repair;  Surgeon: Susa Day, MD;  Location: [facility];  Service: Orthopedics;  Laterality: Right;  90 mins  . TONSILLECTOMY    . TOTAL KNEE ARTHROPLASTY  08/11/2011   Procedure: TOTAL KNEE ARTHROPLASTY;  Surgeon: [name], MD;  Location: [facility];  Service: Orthopedics;  Laterality: Left;  . TOTAL KNEE ARTHROPLASTY Left 2010    There were no vitals filed for this visit.  Subjective Assessment - 02/01/19 1345    Subjective  Pt reports no pain today but had some back and knee pain over the weekend. Pt states that she is having her physician take a second look at her back. She states that PT is benefiting here though    Pertinent History  LBP. Pain onset 4-5 years ago insidiously. Patient has received multiple epidurals for managing the  pain with good results. Patient reports MRI positive for pinched nerve L3-4 and L5-S1. Attended physical therapy 9 visits with some improvement. Agg: flexion activities, sit <> stand, transfers. Ease: moving out of agg position; moving slowly and carefully. Reports HTN, Hyperlipidemia, Cancer (melanoma dx 1970), 2006 lumbar decompression for nerve pain    Limitations  Lifting;House hold activities    How long can you sit comfortably?  indefinitely but pain during transfer    How long can you stand comfortably?  indefinitely    How long can you walk comfortably?  indefinitely    Diagnostic tests  MRI positive as described above    Patient Stated Goals  Improve core strength; reduce pain    Currently in Pain?  No/denies    Pain Onset  More than a month ago        Treatment   TA  Lateral Step downs 2 x 12   SLS 3 x 30 seconds each (balance) Tandem  walking 4 x 8ft w/ CGA    TE Squats w/ table as external cue at the bottom 3 x 10 Standing Hip Abductions 2 x 12  Therapeutic exercises and activities were performed to help increase hip and quad strength    PT Education - 02/01/19 1713    Education Details  form/technique with exercises    Person(s) Educated  Patient    Methods  Explanation;Demonstration;Verbal cues    Comprehension  Verbalized understanding;Returned demonstration;Verbal cues required;Tactile cues required       PT Short Term Goals - 01/11/19 1756      PT SHORT TERM GOAL #1   Title  Patient will demonstrate adherence to HEP 3x/wk as adjunct to clinical therapy to speed recovery and reduce total number of visits.    Baseline  HEP given    Time  2    Period  Weeks    Status  Achieved    Target Date  12/07/18        PT Long Term Goals - 01/11/19 1537      PT LONG TERM GOAL #1   Title  Patient will reduce score on MODI by 10% to achieve MCID and demonstrate reduced disability.    Baseline  26%; 01/11/2019 38%    Time  6    Period  Weeks    Status  On-going      PT LONG TERM GOAL #2   Title  Patient will report no increased pain with meal prep activities to indicate improved pain response with repeated flexion.    Baseline  9-10/10; 01/11/2019 9/10;    Time  6    Period  Weeks    Status  On-going      PT LONG TERM GOAL #3   Title  Patient will report ability to perform 5 sit <> stand transfers without increase in pain and with good technique to demonstrate reduced pain with transfers.    Baseline  Hip adduction/UE assist with sit <> stand; reports increase in pain; 01/11/2019 able to sit <> stand without UE or pain increase but hip adduction moment present    Time  6    Period  Weeks    Status  On-going            Plan - 02/01/19 1345    Clinical Impression Statement  Pt is doing well with therex as evidenced by advancement to a squat exercise. Internal cueing was provided to push the knees  out on the way down with  the squat and external cue to hit the chair on the way down. Patient still requires external hand cues with standing hip abductions; however, a quality gluteal contraction is noted with each repetition. Pt is nearing the end of her available visits and will have her physician take a second evaluation of her back    Personal Factors and Comorbidities  Comorbidity 1;Comorbidity 2;Comorbidity 3+;Age    Comorbidities  HTN, Hyperlipidemia, Cancer    Examination-Activity Limitations  Carry;Stand;Stairs;Locomotion Level;Bend;Lift;Squat;Sit    Examination-Participation Restrictions  Cleaning;Laundry;Meal Prep    Stability/Clinical Decision Making  Stable/Uncomplicated    Rehab Potential  Good    PT Frequency  2x / week    PT Duration  6 weeks    PT Treatment/Interventions  ADLs/Self Care Home Management;Moist Heat;Functional mobility training;Therapeutic activities;Therapeutic exercise;Patient/family education;Neuromuscular re-education;Balance training;Stair training;Manual techniques;Passive range of motion;Joint Manipulations;Taping;Dry needling;Electrical Stimulation;Cryotherapy    PT Next Visit Plan  advance HEP. progress thoracic mobilization    PT Home Exercise Plan  Standing extension, thoracic SNAG    Consulted and Agree with Plan of Care  Patient       Patient will benefit from skilled therapeutic intervention in order to improve the following deficits and impairments:  Decreased balance, Decreased endurance, Decreased mobility, Difficulty walking, Hypomobility, Increased muscle spasms, Impaired sensation, Decreased range of motion, Improper body mechanics, Pain, Impaired flexibility, Decreased strength, Decreased activity tolerance, Abnormal gait  Visit Diagnosis: Chronic right-sided low back pain without sciatica  Muscle weakness (generalized)     Problem List Patient Active Problem List   Diagnosis Date Noted  . Overactive bladder 12/23/2018  . PAT  (paroxysmal atrial tachycardia) ([place]) 09/30/2018  . Adult idiopathic generalized osteoporosis 06/04/2018  . OSA on CPAP 03/29/2018  . Recurrent UTI 12/27/2017  . Renal cyst 12/27/2017  . Rotator cuff tear, right 10/02/2017  . Complete rotator cuff tear 10/02/2017  . Tubular adenoma 05/27/2017  . Spondylolisthesis 05/13/2017  . Spinal stenosis of lumbar region 05/13/2017  . Neuritis of saphenous nerve 05/13/2017  . Lumbar radiculopathy 05/13/2017  . Essential hypertension 07/31/2016  . Renal artery stenosis ([place]) 07/31/2016  . DJD (degenerative joint disease) 07/31/2016  . Low serum vitamin D 07/09/2015  . Distal radius fracture, right 06/30/2015  . Hyperlipidemia, mixed 04/10/2015  . History of malignant melanoma 02/13/2014  . Pulmonary emboli ([place]) 06/17/2013  . Pernicious anemia 06/17/2013  . Lumbar disc disease 06/17/2013  . Cervical disc disease 06/17/2013  . Basal cell carcinoma of right cheek 04/28/2013  . S/P left TKA 08/11/2011    Gloriann Loan, SPT 02/01/2019, 5:29 PM  [facility] [address] [place], [address] Phone: [phone]   Fax:  [phone]  Name: TYNASHA MINISH MRN: [id number] Date of Birth: 04/20/1934

## 2019-02-07 ENCOUNTER — Ambulatory Visit: Payer: Medicare Other

## 2019-02-09 ENCOUNTER — Other Ambulatory Visit: Payer: Self-pay

## 2019-02-09 ENCOUNTER — Ambulatory Visit: Payer: Medicare Other

## 2019-02-09 DIAGNOSIS — M6281 Muscle weakness (generalized): Secondary | ICD-10-CM

## 2019-02-09 DIAGNOSIS — M545 Low back pain, unspecified: Secondary | ICD-10-CM

## 2019-02-09 DIAGNOSIS — G8929 Other chronic pain: Secondary | ICD-10-CM

## 2019-02-09 NOTE — Therapy (Signed)
[facility] [address] [place], [address] Phone: [phone]   Fax:  [phone]  Physical Therapy Treatment  Patient Details  Name: Anita Bailey MRN: [id number] Date of Birth: 1935/01/04 Referring Provider (PT): Vladimir Crofts, MD   Encounter Date: 02/09/2019  PT End of Session - 02/09/19 1604    Visit Number  15    Number of Visits  24    Date for PT Re-Evaluation  02/01/19    Authorization Type  5/10    PT Start Time  1430    PT Stop Time  1515    PT Time Calculation (min)  45 min    Activity Tolerance  Patient tolerated treatment well;No increased pain    Behavior During Therapy  WFL for tasks assessed/performed       Past Medical History:  Diagnosis Date  . Anemia   . Arthritis   . Cancer ([place] [facility])    hx of melanoma in 1971, basal cell   . Cervical disc disease   . Chronic kidney disease    current uti 08/05/11   . Depression   . DVT (deep venous thrombosis) ([facility]) 01/2015   was Eliquis until 06/13/15-   . Dysrhythmia    hx of heart racing - in past, "none  in years".  had a stress test > 7 years  . History of recurrent UTIs    "bladder infection"  Had a PICC line in past  . Hyperlipidemia   . Hypertension   . Melanoma ([place])   . Nephrolithiasis   . Pulmonary emboli ([facility])    bilateral 8/12 hospitalized   . Vitamin D deficiency     Past Surgical History:  Procedure Laterality Date  . ABDOMINAL HYSTERECTOMY     1981  . BACK SURGERY    . BREAST EXCISIONAL BIOPSY Right 1979   Negative  . COLONOSCOPY W/ POLYPECTOMY    . COLONOSCOPY WITH PROPOFOL N/A 04/29/2017   Procedure: COLONOSCOPY WITH PROPOFOL;  Surgeon: [name], MD;  Location: [facility] ENDOSCOPY;  Service: Endoscopy;  Laterality: N/A;  . JOINT REPLACEMENT     bilateral knee  . left wrist      surgery x 2   . OPEN REDUCTION INTERNAL FIXATION (ORIF) DISTAL RADIAL FRACTURE Right 06/30/2015   Procedure: OPEN REDUCTION INTERNAL  FIXATION (ORIF) RIGHT DISTAL RADIUS FRACTURE ;  Surgeon: [name], MD;  Location: [facility];  Service: Orthopedics;  Laterality: Right;  . OTHER SURGICAL HISTORY Right    leg - melonoma  . SHOULDER ARTHROSCOPY WITH OPEN ROTATOR CUFF REPAIR Right 10/02/2017   Procedure: Right shoulder mini open rotator cuff repair;  Surgeon: Susa Day, MD;  Location: [facility];  Service: Orthopedics;  Laterality: Right;  90 mins  . TONSILLECTOMY    . TOTAL KNEE ARTHROPLASTY  08/11/2011   Procedure: TOTAL KNEE ARTHROPLASTY;  Surgeon: [name], MD;  Location: [facility];  Service: Orthopedics;  Laterality: Left;  . TOTAL KNEE ARTHROPLASTY Left 2010    There were no vitals filed for this visit.  Subjective Assessment - 02/09/19 1439    Subjective  Pt reports no pain today, sligt pain today in the back . Pt had a tooth pulled last week and is doing well after that operation. Pt states that squats and golfer lift is helping her a lot    Pertinent History  LBP. Pain onset 4-5 years ago insidiously. Patient has received multiple epidurals for managing the  pain with good results. Patient reports MRI positive for pinched nerve L3-4 and L5-S1. Attended physical therapy 9 visits with some improvement. Agg: flexion activities, sit <> stand, transfers. Ease: moving out of agg position; moving slowly and carefully. Reports HTN, Hyperlipidemia, Cancer (melanoma dx 1970), 2006 lumbar decompression for nerve pain    Limitations  Lifting;House hold activities    How long can you sit comfortably?  indefinitely but pain during transfer    How long can you stand comfortably?  indefinitely    How long can you walk comfortably?  indefinitely    Diagnostic tests  MRI positive as described above    Patient Stated Goals  Improve core strength; reduce pain    Currently in Pain?  No/denies    Pain Onset  More than a month ago        TREATMENT  Neuromuscular Reeducation (noted difficulty with head tilts and change of speed) DGI  - 21/24 Tandem wlakking with your head up 6 x 63ft  Standing leg extensions 2 x 15 controling TA w/ movement   TE STS 2 x 10 (Patient showed dynamic knee valgus, cue to drive knees out)  Neuromuscular Reeducation and therapeutic exercise conducted in order to increase patient's functional level w/o pain and improvement patient's balance impairments     PT Education - 02/09/19 1512    Education Details  form/technique with exercise, HEP    Person(s) Educated  Patient    Methods  Explanation;Demonstration;Verbal cues    Comprehension  Verbalized understanding;Returned demonstration;Verbal cues required;Tactile cues required       PT Short Term Goals - 01/11/19 1756      PT SHORT TERM GOAL #1   Title  Patient will demonstrate adherence to HEP 3x/wk as adjunct to clinical therapy to speed recovery and reduce total number of visits.    Baseline  HEP given    Time  2    Period  Weeks    Status  Achieved    Target Date  12/07/18        PT Long Term Goals - 02/10/19 0818      PT LONG TERM GOAL #1   Title  Patient will reduce score on MODI by 10% to achieve MCID and demonstrate reduced disability.    Baseline  26%; 01/11/2019 38%; 02/09/2019: 22%    Time  6    Period  Weeks    Status  On-going      PT LONG TERM GOAL #2   Title  Patient will report no increased pain with meal prep activities to indicate improved pain response with repeated flexion.    Baseline  9-10/10; 01/11/2019 9/10; 02/10/2019: 3/10    Time  6    Period  Weeks    Status  On-going      PT LONG TERM GOAL #3   Title  Patient will report ability to perform 5 sit <> stand transfers without increase in pain and with good technique to demonstrate reduced pain with transfers.    Baseline  Hip adduction/UE assist with sit <> stand; reports increase in pain; 01/11/2019 able to sit <> stand without UE or pain increase but hip adduction moment present; 02/09/2019: able to perform without pain.    Time  6    Period   Weeks    Status  Achieved            Plan - 02/09/19 [id number]    Clinical Impression Statement  Patient has showed progress w/  pain reduction as she comes in w/o pain in her back. Patient was reevaluated today to see how she has progressed with her goals. Patient has been able to be more functional at home w/ meal prepping and been compliant on her HEP. However, patient was unable to control dynamic knee valgus with the 5 sit to stands. Patient also developed LBP with standing leg extensions which is indicative of lumbar extensor compensation. Pain was decreased in leg extensions w/ cue to engage TA muscles. This shows needed work and improvement on core and hip musculature development. After reevaluation, patient would benfit from continued skilled therapy to return her to PLOF.    Personal Factors and Comorbidities  Comorbidity 1;Comorbidity 2;Comorbidity 3+;Age    Comorbidities  HTN, Hyperlipidemia, Cancer    Examination-Activity Limitations  Carry;Stand;Stairs;Locomotion Level;Bend;Lift;Squat;Sit    Examination-Participation Restrictions  Cleaning;Laundry;Meal Prep    Stability/Clinical Decision Making  Stable/Uncomplicated    Rehab Potential  Good    PT Frequency  2x / week    PT Duration  6 weeks    PT Treatment/Interventions  ADLs/Self Care Home Management;Moist Heat;Functional mobility training;Therapeutic activities;Therapeutic exercise;Patient/family education;Neuromuscular re-education;Balance training;Stair training;Manual techniques;Passive range of motion;Joint Manipulations;Taping;Dry needling;Electrical Stimulation;Cryotherapy    PT Next Visit Plan  advance HEP. progress thoracic mobilization    PT Home Exercise Plan  Standing extension, thoracic SNAG    Consulted and Agree with Plan of Care  Patient       Patient will benefit from skilled therapeutic intervention in order to improve the following deficits and impairments:  Decreased balance, Decreased endurance, Decreased  mobility, Difficulty walking, Hypomobility, Increased muscle spasms, Impaired sensation, Decreased range of motion, Improper body mechanics, Pain, Impaired flexibility, Decreased strength, Decreased activity tolerance, Abnormal gait  Visit Diagnosis: Chronic right-sided low back pain without sciatica  Muscle weakness (generalized)     Problem List Patient Active Problem List   Diagnosis Date Noted  . Overactive bladder 12/23/2018  . PAT (paroxysmal atrial tachycardia) ([place]) 09/30/2018  . Adult idiopathic generalized osteoporosis 06/04/2018  . OSA on CPAP 03/29/2018  . Recurrent UTI 12/27/2017  . Renal cyst 12/27/2017  . Rotator cuff tear, right 10/02/2017  . Complete rotator cuff tear 10/02/2017  . Tubular adenoma 05/27/2017  . Spondylolisthesis 05/13/2017  . Spinal stenosis of lumbar region 05/13/2017  . Neuritis of saphenous nerve 05/13/2017  . Lumbar radiculopathy 05/13/2017  . Essential hypertension 07/31/2016  . Renal artery stenosis ([place]) 07/31/2016  . DJD (degenerative joint disease) 07/31/2016  . Low serum vitamin D 07/09/2015  . Distal radius fracture, right 06/30/2015  . Hyperlipidemia, mixed 04/10/2015  . History of malignant melanoma 02/13/2014  . Pulmonary emboli ([place]) 06/17/2013  . Pernicious anemia 06/17/2013  . Lumbar disc disease 06/17/2013  . Cervical disc disease 06/17/2013  . Basal cell carcinoma of right cheek 04/28/2013  . S/P left TKA 08/11/2011    Gloriann Loan, SPT 02/10/2019, 8:23 AM  [facility] [address], [place], [address] Phone: [phone]   Fax:  [phone]  Name: Anita Bailey MRN: [id number] Date of Birth: December 22, 1934

## 2019-02-10 NOTE — Addendum Note (Signed)
Addended by: Blain Pais on: 02/10/2019 08:27 AM   Modules accepted: Orders

## 2019-02-15 ENCOUNTER — Other Ambulatory Visit: Payer: Self-pay

## 2019-02-15 ENCOUNTER — Ambulatory Visit: Payer: Medicare Other | Attending: Specialist

## 2019-02-15 DIAGNOSIS — M6281 Muscle weakness (generalized): Secondary | ICD-10-CM | POA: Insufficient documentation

## 2019-02-15 DIAGNOSIS — M545 Low back pain, unspecified: Secondary | ICD-10-CM

## 2019-02-15 DIAGNOSIS — G8929 Other chronic pain: Secondary | ICD-10-CM | POA: Diagnosis present

## 2019-02-15 NOTE — Therapy (Signed)
[facility] [address], [place], [address] Phone: [phone]   Fax:  [phone]  Physical Therapy Treatment  Patient Details  Name: Anita Bailey MRN: [id number] Date of Birth: [birth year] Referring Provider (PT): Vladimir Crofts, MD   Encounter Date: 02/15/2019  PT End of Session - 02/15/19 0849    Visit Number  16    Number of Visits  24    Date for PT Re-Evaluation  02/01/19    Authorization Type  6/10    PT Start Time  0845    PT Stop Time  0930    PT Time Calculation (min)  45 min    Activity Tolerance  Patient tolerated treatment well;No increased pain    Behavior During Therapy  WFL for tasks assessed/performed       Past Medical History:  Diagnosis Date  . Anemia   . Arthritis   . Cancer ([place])    hx of melanoma in 1971, basal cell   . Cervical disc disease   . Chronic kidney disease    current uti 08/05/11   . Depression   . DVT (deep venous thrombosis) ([place]) 01/2015   was Eliquis until 06/13/15-   . Dysrhythmia    hx of heart racing - in past, "none  in years".  had a stress test > 7 years  . History of recurrent UTIs    "bladder infection"  Had a PICC line in past  . Hyperlipidemia   . Hypertension   . Melanoma ([place])   . Nephrolithiasis   . Pulmonary emboli ([facility])    bilateral 8/12 hospitalized   . Vitamin D deficiency     Past Surgical History:  Procedure Laterality Date  . ABDOMINAL HYSTERECTOMY     1981  . BACK SURGERY    . BREAST EXCISIONAL BIOPSY Right 1979   Negative  . COLONOSCOPY W/ POLYPECTOMY    . COLONOSCOPY WITH PROPOFOL N/A 04/29/2017   Procedure: COLONOSCOPY WITH PROPOFOL;  Surgeon: [name], MD;  Location: [facility] END[facility];  Service: Endoscopy;  Laterality: N/A;  . JOINT REPLACEMENT     bilateral knee  . left wrist      surgery x 2   . OPEN REDUCTION INTERNAL FIXATION (ORIF) DISTAL RADIAL FRACTURE Right 06/30/2015   Procedure: OPEN REDUCTION INTERNAL  FIXATION (ORIF) RIGHT DISTAL RADIUS FRACTURE ;  Surgeon: [name], MD;  Location: [facility];  Service: Orthopedics;  Laterality: Right;  . OTHER SURGICAL HISTORY Right    leg - melonoma  . SHOULDER ARTHROSCOPY WITH OPEN ROTATOR CUFF REPAIR Right 10/02/2017   Procedure: Right shoulder mini open rotator cuff repair;  Surgeon: Susa Day, MD;  Location: [facility];  Service: Orthopedics;  Laterality: Right;  90 mins  . TONSILLECTOMY    . TOTAL KNEE ARTHROPLASTY  08/11/2011   Procedure: TOTAL KNEE ARTHROPLASTY;  Surgeon: [name], MD;  Location: [facility];  Service: Orthopedics;  Laterality: Left;  . TOTAL KNEE ARTHROPLASTY Left 2010    There were no vitals filed for this visit.     TREATMENT  TE Squatting w/ external cues for knees out  Standing Hip abductions x 15 ea  TA Tandem walking w/ standing hip abductions 4 x 89ft  Hip Hikes x 12 w/ external cue with hands on hips and anterior core activation SLS - eyes closed 3 x 30 seconds     Therapeutic exercises and activities performed to increase lower extremity strength  in the hips and improve balance      PT Education - 02/15/19 0849    Education Details  form/technique with exercise    Person(s) Educated  Patient    Methods  Explanation;Demonstration;Verbal cues    Comprehension  Returned demonstration;Verbalized understanding;Verbal cues required;Tactile cues required       PT Short Term Goals - 01/11/19 1756      PT SHORT TERM GOAL #1   Title  Patient will demonstrate adherence to HEP 3x/wk as adjunct to clinical therapy to speed recovery and reduce total number of visits.    Baseline  HEP given    Time  2    Period  Weeks    Status  Achieved    Target Date  12/07/18        PT Long Term Goals - 02/10/19 0818      PT LONG TERM GOAL #1   Title  Patient will reduce score on MODI by 10% to achieve MCID and demonstrate reduced disability.    Baseline  26%; 01/11/2019 38%; 02/09/2019: 22%    Time  6    Period   Weeks    Status  On-going      PT LONG TERM GOAL #2   Title  Patient will report no increased pain with meal prep activities to indicate improved pain response with repeated flexion.    Baseline  9-10/10; 01/11/2019 9/10; 02/10/2019: 3/10    Time  6    Period  Weeks    Status  On-going      PT LONG TERM GOAL #3   Title  Patient will report ability to perform 5 sit <> stand transfers without increase in pain and with good technique to demonstrate reduced pain with transfers.    Baseline  Hip adduction/UE assist with sit <> stand; reports increase in pain; 01/11/2019 able to sit <> stand without UE or pain increase but hip adduction moment present; 02/09/2019: able to perform without pain.    Time  6    Period  Weeks    Status  Achieved              Patient will benefit from skilled therapeutic intervention in order to improve the following deficits and impairments:     Visit Diagnosis: Chronic right-sided low back pain without sciatica  Muscle weakness (generalized)     Problem List Patient Active Problem List   Diagnosis Date Noted  . Overactive bladder 12/23/2018  . PAT (paroxysmal atrial tachycardia) ([place]) 09/30/2018  . Adult idiopathic generalized osteoporosis 06/04/2018  . OSA on CPAP 03/29/2018  . Recurrent UTI 12/27/2017  . Renal cyst 12/27/2017  . Rotator cuff tear, right 10/02/2017  . Complete rotator cuff tear 10/02/2017  . Tubular adenoma 05/27/2017  . Spondylolisthesis 05/13/2017  . Spinal stenosis of lumbar region 05/13/2017  . Neuritis of saphenous nerve 05/13/2017  . Lumbar radiculopathy 05/13/2017  . Essential hypertension 07/31/2016  . Renal artery stenosis ([place]) 07/31/2016  . DJD (degenerative joint disease) 07/31/2016  . Low serum vitamin D 07/09/2015  . Distal radius fracture, right 06/30/2015  . Hyperlipidemia, mixed 04/10/2015  . History of malignant melanoma 02/13/2014  . Pulmonary emboli ([place]) 06/17/2013  . Pernicious anemia 06/17/2013   . Lumbar disc disease 06/17/2013  . Cervical disc disease 06/17/2013  . Basal cell carcinoma of right cheek 04/28/2013  . S/P left TKA 08/11/2011    Gloriann Loan 02/15/2019, 9:39 AM  [facility] [address], [place], [address] Phone: [phone]   Fax:  [phone]  Name: TIANE GRAVE MRN: [id number] Date of Birth: 01/20/1934

## 2019-02-16 ENCOUNTER — Ambulatory Visit: Payer: Medicare Other

## 2019-02-16 DIAGNOSIS — M545 Low back pain, unspecified: Secondary | ICD-10-CM

## 2019-02-16 DIAGNOSIS — M6281 Muscle weakness (generalized): Secondary | ICD-10-CM

## 2019-02-16 DIAGNOSIS — G8929 Other chronic pain: Secondary | ICD-10-CM

## 2019-02-16 NOTE — Therapy (Signed)
[facility] [address] [place], [address] Phone: [phone]   Fax:  [phone]  Physical Therapy Treatment  Patient Details  Name: Anita Bailey MRN: [id number] Date of Birth: 12-Oct-1934 Referring Provider (PT): Vladimir Crofts, MD   Encounter Date: 02/16/2019  PT End of Session - 02/16/19 1058    Visit Number  17    Number of Visits  24    Date for PT Re-Evaluation  03/09/19    Authorization Type  7/10    PT Start Time  1035    PT Stop Time  1115    PT Time Calculation (min)  40 min    Equipment Utilized During Treatment  Gait belt    Activity Tolerance  Patient tolerated treatment well;No increased pain    Behavior During Therapy  WFL for tasks assessed/performed       Past Medical History:  Diagnosis Date  . Anemia   . Arthritis   . Cancer ([place])    hx of melanoma in 1971, basal cell   . Cervical disc disease   . Chronic kidney disease    current uti 08/05/11   . Depression   . DVT (deep venous thrombosis) ([place]) 01/2015   was Eliquis until 06/13/15-   . Dysrhythmia    hx of heart racing - in past, "none  in years".  had a stress test > 7 years  . History of recurrent UTIs    "bladder infection"  Had a PICC line in past  . Hyperlipidemia   . Hypertension   . Melanoma ([place])   . Nephrolithiasis   . Pulmonary emboli ([facility])    bilateral 8/12 hospitalized   . Vitamin D deficiency     Past Surgical History:  Procedure Laterality Date  . ABDOMINAL HYSTERECTOMY     1981  . BACK SURGERY    . BREAST EXCISIONAL BIOPSY Right 1979   Negative  . COLONOSCOPY W/ POLYPECTOMY    . COLONOSCOPY WITH PROPOFOL N/A 04/29/2017   Procedure: COLONOSCOPY WITH PROPOFOL;  Surgeon: [name], MD;  Location: [facility] ENDOSCOPY;  Service: Endoscopy;  Laterality: N/A;  . JOINT REPLACEMENT     bilateral knee  . left wrist      surgery x 2   . OPEN REDUCTION INTERNAL FIXATION (ORIF) DISTAL RADIAL FRACTURE Right  06/30/2015   Procedure: OPEN REDUCTION INTERNAL FIXATION (ORIF) RIGHT DISTAL RADIUS FRACTURE ;  Surgeon: [name], MD;  Location: [facility];  Service: Orthopedics;  Laterality: Right;  . OTHER SURGICAL HISTORY Right    leg - melonoma  . SHOULDER ARTHROSCOPY WITH OPEN ROTATOR CUFF REPAIR Right 10/02/2017   Procedure: Right shoulder mini open rotator cuff repair;  Surgeon: Susa Day, MD;  Location: [facility];  Service: Orthopedics;  Laterality: Right;  90 mins  . TONSILLECTOMY    . TOTAL KNEE ARTHROPLASTY  08/11/2011   Procedure: TOTAL KNEE ARTHROPLASTY;  Surgeon: [name], MD;  Location: [facility];  Service: Orthopedics;  Laterality: Left;  . TOTAL KNEE ARTHROPLASTY Left 2010    There were no vitals filed for this visit.  TREATMENT  Subjective Assessment - 02/16/19 1136    Subjective  Patient had minimal LBP today. Patient had an appointment with cardiologist yesterday and stated that there were no concerns or issues    Pertinent History  LBP. Pain onset 4-5 years ago insidiously. Patient has received multiple epidurals for managing the pain with good results. Patient  reports MRI positive for pinched nerve L3-4 and L5-S1. Attended physical therapy 9 visits with some improvement. Agg: flexion activities, sit <> stand, transfers. Ease: moving out of agg position; moving slowly and carefully. Reports HTN, Hyperlipidemia, Cancer (melanoma dx 1970), 2006 lumbar decompression for nerve pain    Limitations  Lifting;House hold activities    How long can you sit comfortably?  indefinitely but pain during transfer    How long can you stand comfortably?  indefinitely    How long can you walk comfortably?  indefinitely    Diagnostic tests  MRI positive as described above    Patient Stated Goals  Improve core strength; reduce pain    Currently in Pain?  Yes    Pain Location  Hip    Pain Orientation  Right    Pain Descriptors / Indicators  Aching    Pain Onset  More than a month ago     MT STM  over lumbar extensor and QL x 4 minutes Ischemic Compression over lumbar extensor 2 x 1 minute   TE Hook lying w/ Hands pushing against knees (anterior core activation) 3 x 15 seconds  Hooklying marches x 15 ea Box Step Ups (glute strength) x 5 ea - aberrant movement occurring w/ trendelenberg on right LE  Standing Hip Abductions x 12 ea Standing Hip Extensions x 12 ea  Squats 3 x 12  Golfer's Lift on RLE w/ 3 finger support 3 x 8  Tandem Walking 3 x 75ft w/ Leg abduction    Manual Therapy and Therapeutic Exercises conducted to decrease patient's pain and increase hip muscular strength   PT Short Term Goals - 01/11/19 1756      PT SHORT TERM GOAL #1   Title  Patient will demonstrate adherence to HEP 3x/wk as adjunct to clinical therapy to speed recovery and reduce total number of visits.    Baseline  HEP given    Time  2    Period  Weeks    Status  Achieved    Target Date  12/07/18        PT Long Term Goals - 02/10/19 0818      PT LONG TERM GOAL #1   Title  Patient will reduce score on MODI by 10% to achieve MCID and demonstrate reduced disability.    Baseline  26%; 01/11/2019 38%; 02/09/2019: 22%    Time  6    Period  Weeks    Status  On-going      PT LONG TERM GOAL #2   Title  Patient will report no increased pain with meal prep activities to indicate improved pain response with repeated flexion.    Baseline  9-10/10; 01/11/2019 9/10; 02/10/2019: 3/10    Time  6    Period  Weeks    Status  On-going      PT LONG TERM GOAL #3   Title  Patient will report ability to perform 5 sit <> stand transfers without increase in pain and with good technique to demonstrate reduced pain with transfers.    Baseline  Hip adduction/UE assist with sit <> stand; reports increase in pain; 01/11/2019 able to sit <> stand without UE or pain increase but hip adduction moment present; 02/09/2019: able to perform without pain.    Time  6    Period  Weeks    Status  Achieved             Plan - 02/16/19 1132    Clinical  Impression Statement  Patient demonstrated trendelenberg stance as she initiated a box step up and w/ tandem walking, indicating glute medius and minimus weakness. Patient will require further hip abductor strengthening in standing and in hip flexed/extended positions. Patient is doing well with squatting exercise and limiting aberrent movement at the hips and knees. Patient will continue to benefit from physical therapy to reduce her LBP and return her to PLOF    Personal Factors and Comorbidities  Comorbidity 1;Comorbidity 2;Comorbidity 3+;Age    Comorbidities  HTN, Hyperlipidemia, Cancer    Examination-Activity Limitations  Carry;Stand;Stairs;Locomotion Level;Bend;Lift;Squat;Sit    Examination-Participation Restrictions  Cleaning;Laundry;Meal Prep    Stability/Clinical Decision Making  Stable/Uncomplicated    Rehab Potential  Good    PT Frequency  2x / week    PT Duration  6 weeks    PT Treatment/Interventions  ADLs/Self Care Home Management;Moist Heat;Functional mobility training;Therapeutic activities;Therapeutic exercise;Patient/family education;Neuromuscular re-education;Balance training;Stair training;Manual techniques;Passive range of motion;Joint Manipulations;Taping;Dry needling;Electrical Stimulation;Cryotherapy    PT Next Visit Plan  advance HEP. progress thoracic mobilization    PT Home Exercise Plan  Standing extension, thoracic SNAG    Consulted and Agree with Plan of Care  Patient       Patient will benefit from skilled therapeutic intervention in order to improve the following deficits and impairments:  Decreased balance, Decreased endurance, Decreased mobility, Difficulty walking, Hypomobility, Increased muscle spasms, Impaired sensation, Decreased range of motion, Improper body mechanics, Pain, Impaired flexibility, Decreased strength, Decreased activity tolerance, Abnormal gait  Visit Diagnosis: Chronic right-sided low back  pain without sciatica  Muscle weakness (generalized)     Problem List Patient Active Problem List   Diagnosis Date Noted  . Overactive bladder 12/23/2018  . PAT (paroxysmal atrial tachycardia) ([place]) 09/30/2018  . Adult idiopathic generalized osteoporosis 06/04/2018  . OSA on CPAP 03/29/2018  . Recurrent UTI 12/27/2017  . Renal cyst 12/27/2017  . Rotator cuff tear, right 10/02/2017  . Complete rotator cuff tear 10/02/2017  . Tubular adenoma 05/27/2017  . Spondylolisthesis 05/13/2017  . Spinal stenosis of lumbar region 05/13/2017  . Neuritis of saphenous nerve 05/13/2017  . Lumbar radiculopathy 05/13/2017  . Essential hypertension 07/31/2016  . Renal artery stenosis ([place]) 07/31/2016  . DJD (degenerative joint disease) 07/31/2016  . Low serum vitamin D 07/09/2015  . Distal radius fracture, right 06/30/2015  . Hyperlipidemia, mixed 04/10/2015  . History of malignant melanoma 02/13/2014  . Pulmonary emboli ([place]) 06/17/2013  . Pernicious anemia 06/17/2013  . Lumbar disc disease 06/17/2013  . Cervical disc disease 06/17/2013  . Basal cell carcinoma of right cheek 04/28/2013  . S/P left TKA 08/11/2011    Gloriann Loan, SPT 02/17/2019, 10:26 AM  [facility] [address], [place], [address] Phone: [phone]   Fax:  [phone]  Name: LOURENA LICHT MRN: [id number] Date of Birth: 08/30/1934

## 2019-02-23 ENCOUNTER — Ambulatory Visit: Payer: Medicare Other

## 2019-03-01 ENCOUNTER — Ambulatory Visit: Payer: Medicare Other

## 2019-03-03 ENCOUNTER — Ambulatory Visit: Payer: Medicare Other

## 2019-03-07 ENCOUNTER — Other Ambulatory Visit: Payer: Self-pay | Admitting: Specialist

## 2019-03-07 DIAGNOSIS — M5136 Other intervertebral disc degeneration, lumbar region: Secondary | ICD-10-CM

## 2019-03-08 ENCOUNTER — Ambulatory Visit: Payer: Medicare Other

## 2019-03-09 ENCOUNTER — Ambulatory Visit
Admission: RE | Admit: 2019-03-09 | Discharge: 2019-03-09 | Disposition: A | Discharge status: Home / Self Care | Payer: Medicare Other | Source: Ambulatory Visit | Attending: Specialist | Admitting: Specialist

## 2019-03-09 ENCOUNTER — Other Ambulatory Visit: Payer: Self-pay

## 2019-03-09 DIAGNOSIS — M5136 Other intervertebral disc degeneration, lumbar region: Secondary | ICD-10-CM

## 2019-03-09 MED ORDER — IOPAMIDOL (ISOVUE-M 200) INJECTION 41%
1.0000 mL | Freq: Once | INTRAMUSCULAR | Status: AC
  Administered 2019-03-09: 1 mL via EPIDURAL

## 2019-03-09 MED ORDER — METHYLPREDNISOLONE ACETATE 40 MG/ML INJ SUSP (RADIOLOG
120.0000 mg | Freq: Once | INTRAMUSCULAR | Status: AC
  Administered 2019-03-09: 120 mg via EPIDURAL

## 2019-03-09 NOTE — Discharge Instructions (Signed)

## 2019-03-10 ENCOUNTER — Ambulatory Visit: Payer: Medicare Other

## 2019-03-14 ENCOUNTER — Other Ambulatory Visit: Payer: Self-pay

## 2019-03-14 ENCOUNTER — Ambulatory Visit: Payer: Medicare Other | Attending: Specialist

## 2019-03-14 DIAGNOSIS — M545 Low back pain, unspecified: Secondary | ICD-10-CM

## 2019-03-14 DIAGNOSIS — G8929 Other chronic pain: Secondary | ICD-10-CM | POA: Insufficient documentation

## 2019-03-14 DIAGNOSIS — M6281 Muscle weakness (generalized): Secondary | ICD-10-CM | POA: Diagnosis present

## 2019-03-14 DIAGNOSIS — M25551 Pain in right hip: Secondary | ICD-10-CM | POA: Diagnosis present

## 2019-03-14 NOTE — Therapy (Signed)
[facility] [address], [place], [address] Phone: [phone]   Fax:  [phone]  Physical Therapy Treatment/Progress Note  Patient Details  Name: Anita Bailey MRN: [id number] Date of Birth: 10/15/1934 Referring Provider (PT): Vladimir Crofts, MD  Reporting Period: 01/31/2019 - 03/14/2019  Encounter Date: 03/14/2019  PT End of Session - 03/14/19 1500    Visit Number  18    Number of Visits  24    Date for PT Re-Evaluation  04/11/19    Authorization Type  1/10    PT Start Time  1445    PT Stop Time  1530    PT Time Calculation (min)  45 min    Equipment Utilized During Treatment  Gait belt    Activity Tolerance  Patient tolerated treatment well;No increased pain    Behavior During Therapy  WFL for tasks assessed/performed       Past Medical History:  Diagnosis Date  . Anemia   . Arthritis   . Cancer ([place])    hx of melanoma in 1971, basal cell   . Cervical disc disease   . Chronic kidney disease    current uti 08/05/11   . Depression   . DVT (deep venous thrombosis) ([place]) 01/2015   was Eliquis until 06/13/15-   . Dysrhythmia    hx of heart racing - in past, "none  in years".  had a stress test > 7 years  . History of recurrent UTIs    "bladder infection"  Had a PICC line in past  . Hyperlipidemia   . Hypertension   . Melanoma ([place])   . Nephrolithiasis   . Pulmonary emboli ([facility])    bilateral 8/12 hospitalized   . Vitamin D deficiency     Past Surgical History:  Procedure Laterality Date  . ABDOMINAL HYSTERECTOMY     1981  . BACK SURGERY    . BREAST EXCISIONAL BIOPSY Right 1979   Negative  . COLONOSCOPY W/ POLYPECTOMY    . COLONOSCOPY WITH PROPOFOL N/A 04/29/2017   Procedure: COLONOSCOPY WITH PROPOFOL;  Surgeon: [name], MD;  Location: [facility] END[facility];  Service: Endoscopy;  Laterality: N/A;  . JOINT REPLACEMENT     bilateral knee  . left wrist      surgery x 2   . OPEN  REDUCTION INTERNAL FIXATION (ORIF) DISTAL RADIAL FRACTURE Right 06/30/2015   Procedure: OPEN REDUCTION INTERNAL FIXATION (ORIF) RIGHT DISTAL RADIUS FRACTURE ;  Surgeon: [name], MD;  Location: [facility];  Service: Orthopedics;  Laterality: Right;  . OTHER SURGICAL HISTORY Right    leg - melonoma  . SHOULDER ARTHROSCOPY WITH OPEN ROTATOR CUFF REPAIR Right 10/02/2017   Procedure: Right shoulder mini open rotator cuff repair;  Surgeon: Susa Day, MD;  Location: [facility];  Service: Orthopedics;  Laterality: Right;  90 mins  . TONSILLECTOMY    . TOTAL KNEE ARTHROPLASTY  08/11/2011   Procedure: TOTAL KNEE ARTHROPLASTY;  Surgeon: [name], MD;  Location: [facility];  Service: Orthopedics;  Laterality: Left;  . TOTAL KNEE ARTHROPLASTY Left 2010    There were no vitals filed for this visit.  Subjective Assessment - 03/14/19 1455    Subjective  Patient states she had an epidural last Friday. Patient states decreased pain after the epidural but states she "didn't feel good" for the past couple of weeks but does not want to elaborate.    Pertinent History  LBP. Pain onset 4-5  years ago insidiously. Patient has received multiple epidurals for managing the pain with good results. Patient reports MRI positive for pinched nerve L3-4 and L5-S1. Attended physical therapy 9 visits with some improvement. Agg: flexion activities, sit <> stand, transfers. Ease: moving out of agg position; moving slowly and carefully. Reports HTN, Hyperlipidemia, Cancer (melanoma dx 1970), 2006 lumbar decompression for nerve pain    Limitations  Lifting;House hold activities    How long can you sit comfortably?  indefinitely but pain during transfer    How long can you stand comfortably?  indefinitely    How long can you walk comfortably?  indefinitely    Diagnostic tests  MRI positive as described above    Patient Stated Goals  Improve core strength; reduce pain    Currently in Pain?  Yes    Pain Score  3     Pain Location   Back    Pain Orientation  Right    Pain Descriptors / Indicators  Aching    Pain Type  Chronic pain    Pain Onset  More than a month ago    Pain Frequency  Intermittent         TREATMENT Therapeutic Exercise Hip extension in standing - x25 Hip abduction in standing - x 25 Lumbar extension in standing with arms straight - x 25 Single leg RDL - x10 with 10# weight  Hip extension in prone - x 10 B Therapeutic Exercises performed to improve posterior chain strength Manual therapy STM performed to the lumbar extensors with patient in prone to decrease increased pain and spasms utilizing superficial techniques       PT Education - 03/14/19 1458    Education Details  form/technique with exercise    Person(s) Educated  Patient    Methods  Explanation;Demonstration    Comprehension  Returned demonstration;Verbalized understanding       PT Short Term Goals - 01/11/19 1756      PT SHORT TERM GOAL #1   Title  Patient will demonstrate adherence to HEP 3x/wk as adjunct to clinical therapy to speed recovery and reduce total number of visits.    Baseline  HEP given    Time  2    Period  Weeks    Status  Achieved    Target Date  12/07/18        PT Long Term Goals - 03/14/19 1451      PT LONG TERM GOAL #1   Title  Patient will reduce score on MODI by 10% to achieve MCID and demonstrate reduced disability.    Baseline  26%; 01/11/2019 38%; 02/09/2019: 22%; 03/14/2019: 20%    Time  6    Period  Weeks    Status  On-going      PT LONG TERM GOAL #2   Title  Patient will report no increased pain with meal prep activities to indicate improved pain response with repeated flexion.    Baseline  9-10/10; 01/11/2019 9/10; 02/10/2019: 3/10; 03/14/2019: 4/10    Time  6    Period  Weeks    Status  On-going      PT LONG TERM GOAL #3   Title  Patient will report ability to perform 5 sit <> stand transfers without increase in pain and with good technique to demonstrate reduced pain with  transfers.    Baseline  Hip adduction/UE assist with sit <> stand; reports increase in pain; 01/11/2019 able to sit <> stand without UE or pain increase  but hip adduction moment present; 02/09/2019: able to perform without pain.    Time  6    Period  Weeks    Status  Achieved            Plan - 03/14/19 1506    Clinical Impression Statement  No major changes since the previous reassessment most likely due to patient only able to attend one visit since the previous session. Patient continues to have decreased strength along her posterior chain (glutes and hamstrings more specifically) addressed these limitations during today's session. Patient able to perform greater amount hip extension exercises after manual therapy. Patient will benefit from further skilled therapy to return to prior level of function.    Personal Factors and Comorbidities  Comorbidity 1;Comorbidity 2;Comorbidity 3+;Age    Comorbidities  HTN, Hyperlipidemia, Cancer    Examination-Activity Limitations  Carry;Stand;Stairs;Locomotion Level;Bend;Lift;Squat;Sit    Examination-Participation Restrictions  Cleaning;Laundry;Meal Prep    Stability/Clinical Decision Making  Stable/Uncomplicated    Rehab Potential  Good    PT Frequency  2x / week    PT Duration  6 weeks    PT Treatment/Interventions  ADLs/Self Care Home Management;Moist Heat;Functional mobility training;Therapeutic activities;Therapeutic exercise;Patient/family education;Neuromuscular re-education;Balance training;Stair training;Manual techniques;Passive range of motion;Joint Manipulations;Taping;Dry needling;Electrical Stimulation;Cryotherapy    PT Next Visit Plan  advance HEP. progress thoracic mobilization    PT Home Exercise Plan  Standing extension, thoracic SNAG    Consulted and Agree with Plan of Care  Patient       Patient will benefit from skilled therapeutic intervention in order to improve the following deficits and impairments:  Decreased balance,  Decreased endurance, Decreased mobility, Difficulty walking, Hypomobility, Increased muscle spasms, Impaired sensation, Decreased range of motion, Improper body mechanics, Pain, Impaired flexibility, Decreased strength, Decreased activity tolerance, Abnormal gait  Visit Diagnosis: Chronic right-sided low back pain without sciatica  Muscle weakness (generalized)     Problem List Patient Active Problem List   Diagnosis Date Noted  . Overactive bladder 12/23/2018  . PAT (paroxysmal atrial tachycardia) ([place]) 09/30/2018  . Adult idiopathic generalized osteoporosis 06/04/2018  . OSA on CPAP 03/29/2018  . Recurrent UTI 12/27/2017  . Renal cyst 12/27/2017  . Rotator cuff tear, right 10/02/2017  . Complete rotator cuff tear 10/02/2017  . Tubular adenoma 05/27/2017  . Spondylolisthesis 05/13/2017  . Spinal stenosis of lumbar region 05/13/2017  . Neuritis of saphenous nerve 05/13/2017  . Lumbar radiculopathy 05/13/2017  . Essential hypertension 07/31/2016  . Renal artery stenosis ([place]) 07/31/2016  . DJD (degenerative joint disease) 07/31/2016  . Low serum vitamin D 07/09/2015  . Distal radius fracture, right 06/30/2015  . Hyperlipidemia, mixed 04/10/2015  . History of malignant melanoma 02/13/2014  . Pulmonary emboli ([place]) 06/17/2013  . Pernicious anemia 06/17/2013  . Lumbar disc disease 06/17/2013  . Cervical disc disease 06/17/2013  . Basal cell carcinoma of right cheek 04/28/2013  . S/P left TKA 08/11/2011    Blythe Stanford, PT DPT 03/14/2019, 3:41 PM  [facility] [address] [place], [address] Phone: [phone]   Fax:  [phone]  Name: Anita Bailey MRN: [id number] Date of Birth: 30-Jan-1934

## 2019-03-16 ENCOUNTER — Other Ambulatory Visit: Payer: Self-pay

## 2019-03-16 ENCOUNTER — Ambulatory Visit: Payer: Medicare Other

## 2019-03-16 DIAGNOSIS — G8929 Other chronic pain: Secondary | ICD-10-CM

## 2019-03-16 DIAGNOSIS — M6281 Muscle weakness (generalized): Secondary | ICD-10-CM

## 2019-03-16 DIAGNOSIS — M545 Low back pain, unspecified: Secondary | ICD-10-CM

## 2019-03-16 NOTE — Therapy (Signed)
[facility] [address], [place], [address] Phone: [phone]   Fax:  [phone]  Physical Therapy Treatment  Patient Details  Name: Anita Bailey MRN: [id number] Date of Birth: Oct 14, 1934 Referring Provider (PT): Vladimir Crofts, MD   Encounter Date: 03/16/2019  PT End of Session - 03/16/19 1029    Visit Number  19    Number of Visits  24    Date for PT Re-Evaluation  04/11/19    Authorization Type  2/10    PT Start Time  1015    PT Stop Time  1100    PT Time Calculation (min)  45 min    Equipment Utilized During Treatment  Gait belt    Activity Tolerance  Patient tolerated treatment well;No increased pain    Behavior During Therapy  WFL for tasks assessed/performed       Past Medical History:  Diagnosis Date  . Anemia   . Arthritis   . Cancer ([place])    hx of melanoma in 1971, basal cell   . Cervical disc disease   . Chronic kidney disease    current uti 08/05/11   . Depression   . DVT (deep venous thrombosis) ([place]) 01/2015   was Eliquis until 06/13/15-   . Dysrhythmia    hx of heart racing - in past, "none  in years".  had a stress test > 7 years  . History of recurrent UTIs    "bladder infection"  Had a PICC line in past  . Hyperlipidemia   . Hypertension   . Melanoma ([place])   . Nephrolithiasis   . Pulmonary emboli ([facility])    bilateral 8/12 hospitalized   . Vitamin D deficiency     Past Surgical History:  Procedure Laterality Date  . ABDOMINAL HYSTERECTOMY     1981  . BACK SURGERY    . BREAST EXCISIONAL BIOPSY Right 1979   Negative  . COLONOSCOPY W/ POLYPECTOMY    . COLONOSCOPY WITH PROPOFOL N/A 04/29/2017   Procedure: COLONOSCOPY WITH PROPOFOL;  Surgeon: [name], MD;  Location: [facility] ENDOSCOPY;  Service: Endoscopy;  Laterality: N/A;  . JOINT REPLACEMENT     bilateral knee  . left wrist      surgery x 2   . OPEN REDUCTION INTERNAL FIXATION (ORIF) DISTAL RADIAL FRACTURE Right  06/30/2015   Procedure: OPEN REDUCTION INTERNAL FIXATION (ORIF) RIGHT DISTAL RADIUS FRACTURE ;  Surgeon: [name], MD;  Location: [facility];  Service: Orthopedics;  Laterality: Right;  . OTHER SURGICAL HISTORY Right    leg - melonoma  . SHOULDER ARTHROSCOPY WITH OPEN ROTATOR CUFF REPAIR Right 10/02/2017   Procedure: Right shoulder mini open rotator cuff repair;  Surgeon: Susa Day, MD;  Location: [facility];  Service: Orthopedics;  Laterality: Right;  90 mins  . TONSILLECTOMY    . TOTAL KNEE ARTHROPLASTY  08/11/2011   Procedure: TOTAL KNEE ARTHROPLASTY;  Surgeon: [name], MD;  Location: [facility];  Service: Orthopedics;  Laterality: Left;  . TOTAL KNEE ARTHROPLASTY Left 2010    There were no vitals filed for this visit.  Subjective Assessment - 03/16/19 1025    Subjective  Patient reports she is doing well this am. Patient reports she slept well and has been feeling good.    Pertinent History  LBP. Pain onset 4-5 years ago insidiously. Patient has received multiple epidurals for managing the pain with good results. Patient reports MRI positive for pinched  nerve L3-4 and L5-S1. Attended physical therapy 9 visits with some improvement. Agg: flexion activities, sit <> stand, transfers. Ease: moving out of agg position; moving slowly and carefully. Reports HTN, Hyperlipidemia, Cancer (melanoma dx 1970), 2006 lumbar decompression for nerve pain    Limitations  Lifting;House hold activities    How long can you sit comfortably?  indefinitely but pain during transfer    How long can you stand comfortably?  indefinitely    How long can you walk comfortably?  indefinitely    Diagnostic tests  MRI positive as described above    Patient Stated Goals  Improve core strength; reduce pain    Currently in Pain?  No/denies    Pain Onset  More than a month ago         TREATMENT Therapeutic Exercise Hip extension in standing - 2x20 with YTB Hip abduction in standing - 2x20 with YTB Sit to stands in  standing - 2 x 20 Derotation Paloff press - x 10 with double GTB  Single leg RDL - x10 with 10# weight  Therapeutic Exercises performed to improve posterior chain strength   PT Education - 03/16/19 1028    Education Details  form/technique with exercise    Person(s) Educated  Patient    Methods  Explanation;Demonstration    Comprehension  Verbalized understanding;Returned demonstration       PT Short Term Goals - 01/11/19 1756      PT SHORT TERM GOAL #1   Title  Patient will demonstrate adherence to HEP 3x/wk as adjunct to clinical therapy to speed recovery and reduce total number of visits.    Baseline  HEP given    Time  2    Period  Weeks    Status  Achieved    Target Date  12/07/18        PT Long Term Goals - 03/14/19 1451      PT LONG TERM GOAL #1   Title  Patient will reduce score on MODI by 10% to achieve MCID and demonstrate reduced disability.    Baseline  26%; 01/11/2019 38%; 02/09/2019: 22%; 03/14/2019: 20%    Time  6    Period  Weeks    Status  On-going      PT LONG TERM GOAL #2   Title  Patient will report no increased pain with meal prep activities to indicate improved pain response with repeated flexion.    Baseline  9-10/10; 01/11/2019 9/10; 02/10/2019: 3/10; 03/14/2019: 4/10    Time  6    Period  Weeks    Status  On-going      PT LONG TERM GOAL #3   Title  Patient will report ability to perform 5 sit <> stand transfers without increase in pain and with good technique to demonstrate reduced pain with transfers.    Baseline  Hip adduction/UE assist with sit <> stand; reports increase in pain; 01/11/2019 able to sit <> stand without UE or pain increase but hip adduction moment present; 02/09/2019: able to perform without pain.    Time  6    Period  Weeks    Status  Achieved            Plan - 03/16/19 1050    Clinical Impression Statement  Continued to progress hamstring, glute, and lumbar extensor strength during today's session. Patient demonstrates  improvement with exercises requiring less cueing and able to go through a deeper squat with less overal compensations. Patient is improving overall and  will continue to benefit from further skilled therapy to return to prior level of function.    Personal Factors and Comorbidities  Comorbidity 1;Comorbidity 2;Comorbidity 3+;Age    Comorbidities  HTN, Hyperlipidemia, Cancer    Examination-Activity Limitations  Carry;Stand;Stairs;Locomotion Level;Bend;Lift;Squat;Sit    Examination-Participation Restrictions  Cleaning;Laundry;Meal Prep    Stability/Clinical Decision Making  Stable/Uncomplicated    Rehab Potential  Good    PT Frequency  2x / week    PT Duration  6 weeks    PT Treatment/Interventions  ADLs/Self Care Home Management;Moist Heat;Functional mobility training;Therapeutic activities;Therapeutic exercise;Patient/family education;Neuromuscular re-education;Balance training;Stair training;Manual techniques;Passive range of motion;Joint Manipulations;Taping;Dry needling;Electrical Stimulation;Cryotherapy    PT Next Visit Plan  advance HEP. progress thoracic mobilization    PT Home Exercise Plan  Standing extension, thoracic SNAG    Consulted and Agree with Plan of Care  Patient       Patient will benefit from skilled therapeutic intervention in order to improve the following deficits and impairments:  Decreased balance, Decreased endurance, Decreased mobility, Difficulty walking, Hypomobility, Increased muscle spasms, Impaired sensation, Decreased range of motion, Improper body mechanics, Pain, Impaired flexibility, Decreased strength, Decreased activity tolerance, Abnormal gait  Visit Diagnosis: Chronic right-sided low back pain without sciatica  Muscle weakness (generalized)     Problem List Patient Active Problem List   Diagnosis Date Noted  . Overactive bladder 12/23/2018  . PAT (paroxysmal atrial tachycardia) ([place]) 09/30/2018  . Adult idiopathic generalized osteoporosis  06/04/2018  . OSA on CPAP 03/29/2018  . Recurrent UTI 12/27/2017  . Renal cyst 12/27/2017  . Rotator cuff tear, right 10/02/2017  . Complete rotator cuff tear 10/02/2017  . Tubular adenoma 05/27/2017  . Spondylolisthesis 05/13/2017  . Spinal stenosis of lumbar region 05/13/2017  . Neuritis of saphenous nerve 05/13/2017  . Lumbar radiculopathy 05/13/2017  . Essential hypertension 07/31/2016  . Renal artery stenosis ([place]) 07/31/2016  . DJD (degenerative joint disease) 07/31/2016  . Low serum vitamin D 07/09/2015  . Distal radius fracture, right 06/30/2015  . Hyperlipidemia, mixed 04/10/2015  . History of malignant melanoma 02/13/2014  . Pulmonary emboli ([place]) 06/17/2013  . Pernicious anemia 06/17/2013  . Lumbar disc disease 06/17/2013  . Cervical disc disease 06/17/2013  . Basal cell carcinoma of right cheek 04/28/2013  . S/P left TKA 08/11/2011    Blythe Stanford, PT DPT 03/16/2019, 11:11 AM  [facility] [address] [place], [address] Phone: [phone]   Fax:  [phone]  Name: Anita Bailey MRN: [id number] Date of Birth: July 08, 1934

## 2019-03-21 ENCOUNTER — Ambulatory Visit: Payer: Medicare Other

## 2019-03-21 ENCOUNTER — Other Ambulatory Visit: Payer: Self-pay

## 2019-03-21 DIAGNOSIS — M545 Low back pain, unspecified: Secondary | ICD-10-CM

## 2019-03-21 DIAGNOSIS — M25551 Pain in right hip: Secondary | ICD-10-CM

## 2019-03-21 DIAGNOSIS — M6281 Muscle weakness (generalized): Secondary | ICD-10-CM

## 2019-03-21 DIAGNOSIS — G8929 Other chronic pain: Secondary | ICD-10-CM

## 2019-03-21 NOTE — Therapy (Signed)
[facility] [address], [place], [address] Phone: [phone]   Fax:  [phone]  Physical Therapy Treatment  Patient Details  Name: Anita Bailey MRN: [id number] Date of Birth: 1934-03-21 Referring Mateus Rewerts (PT): Vladimir Crofts, MD   Encounter Date: 03/21/2019  PT End of Session - 03/21/19 1325    Visit Number  20    Number of Visits  24    Date for PT Re-Evaluation  04/11/19    Authorization Type  3/10    PT Start Time  [id number]    PT Stop Time  1345    PT Time Calculation (min)  40 min    Equipment Utilized During Treatment  Gait belt    Activity Tolerance  Patient tolerated treatment well;No increased pain    Behavior During Therapy  WFL for tasks assessed/performed       Past Medical History:  Diagnosis Date  . Anemia   . Arthritis   . Cancer ([place])    hx of melanoma in 1971, basal cell   . Cervical disc disease   . Chronic kidney disease    current uti 08/05/11   . Depression   . DVT (deep venous thrombosis) ([place]) 01/2015   was Eliquis until 06/13/15-   . Dysrhythmia    hx of heart racing - in past, "none  in years".  had a stress test > 7 years  . History of recurrent UTIs    "bladder infection"  Had a PICC line in past  . Hyperlipidemia   . Hypertension   . Melanoma ([place])   . Nephrolithiasis   . Pulmonary emboli ([facility])    bilateral 8/12 hospitalized   . Vitamin D deficiency     Past Surgical History:  Procedure Laterality Date  . ABDOMINAL HYSTERECTOMY     1981  . BACK SURGERY    . BREAST EXCISIONAL BIOPSY Right 1979   Negative  . COLONOSCOPY W/ POLYPECTOMY    . COLONOSCOPY WITH PROPOFOL N/A 04/29/2017   Procedure: COLONOSCOPY WITH PROPOFOL;  Surgeon: [name], MD;  [facility] END[facility]COPY;  Service: Endoscopy;  Laterality: N/A;  . JOINT REPLACEMENT     bilateral knee  . left wrist      surgery x 2   . OPEN REDUCTION INTERNAL FIXATION (ORIF) DISTAL RADIAL FRACTURE Right  06/30/2015   Procedure: OPEN REDUCTION INTERNAL FIXATION (ORIF) RIGHT DISTAL RADIUS FRACTURE ;  Surgeon: [name], MD;  Location: [facility];  Service: Orthopedics;  Laterality: Right;  . OTHER SURGICAL HISTORY Right    leg - melonoma  . SHOULDER ARTHROSCOPY WITH OPEN ROTATOR CUFF REPAIR Right 10/02/2017   Procedure: Right shoulder mini open rotator cuff repair;  Surgeon: Susa Day, MD;  Location: [facility] ORS;  Service: Orthopedics;  Laterality: Right;  90 mins  . TONSILLECTOMY    . TOTAL KNEE ARTHROPLASTY  08/11/2011   Procedure: TOTAL KNEE ARTHROPLASTY;  Surgeon: [name], MD;  Location: [facility];  Service: Orthopedics;  Laterality: Left;  . TOTAL KNEE ARTHROPLASTY Left 2010    There were no vitals filed for this visit.  Subjective Assessment - 03/21/19 1322    Subjective  Patient reports no major changes since the previous sesion. Patient reports she is improving.    Pertinent History  LBP. Pain onset 4-5 years ago insidiously. Patient has received multiple epidurals for managing the pain with good results. Patient reports MRI positive for pinched nerve L3-4 and L5-S1.  Attended physical therapy 9 visits with some improvement. Agg: flexion activities, sit <> stand, transfers. Ease: moving out of agg position; moving slowly and carefully. Reports HTN, Hyperlipidemia, Cancer (melanoma dx 1970), 2006 lumbar decompression for nerve pain    Limitations  Lifting;House hold activities    How long can you sit comfortably?  indefinitely but pain during transfer    How long can you stand comfortably?  indefinitely    How long can you walk comfortably?  indefinitely    Diagnostic tests  MRI positive as described above    Patient Stated Goals  Improve core strength; reduce pain    Currently in Pain?  No/denies    Pain Onset  More than a month ago         TREATMENT Therapeutic Exercise Hip circles with YTB - x 20 in each directions cw/ccw Sit to stands in standing - 2 x 20  Squats putting  cones on the floor - x 15  Derotation Paloff press - x 10 with double GTB   Straight arm push downs with GTB - x 15  Rotational standing oblique activation with 10# -- x 10 B   Single leg RDL - x12 with 10# weight B  Therapeutic Exercises performed to improve posterior chain strength    PT Education - 03/21/19 1325    Education Details  form/technique with exercise    Person(s) Educated  Patient    Methods  Explanation;Demonstration    Comprehension  Returned demonstration;Verbalized understanding       PT Short Term Goals - 01/11/19 1756      PT SHORT TERM GOAL #1   Title  Patient will demonstrate adherence to HEP 3x/wk as adjunct to clinical therapy to speed recovery and reduce total number of visits.    Baseline  HEP given    Time  2    Period  Weeks    Status  Achieved    Target Date  12/07/18        PT Long Term Goals - 03/14/19 1451      PT LONG TERM GOAL #1   Title  Patient will reduce score on MODI by 10% to achieve MCID and demonstrate reduced disability.    Baseline  26%; 01/11/2019 38%; 02/09/2019: 22%; 03/14/2019: 20%    Time  6    Period  Weeks    Status  On-going      PT LONG TERM GOAL #2   Title  Patient will report no increased pain with meal prep activities to indicate improved pain response with repeated flexion.    Baseline  9-10/10; 01/11/2019 9/10; 02/10/2019: 3/10; 03/14/2019: 4/10    Time  6    Period  Weeks    Status  On-going      PT LONG TERM GOAL #3   Title  Patient will report ability to perform 5 sit <> stand transfers without increase in pain and with good technique to demonstrate reduced pain with transfers.    Baseline  Hip adduction/UE assist with sit <> stand; reports increase in pain; 01/11/2019 able to sit <> stand without UE or pain increase but hip adduction moment present; 02/09/2019: able to perform without pain.    Time  6    Period  Weeks    Status  Achieved            Plan - 03/21/19 1329    Clinical Impression  Statement  Patient performed more exercises today focusing on improving core strengthening,  hip strengthening and improving single leg stabilization. Patient able to perform more repetitions of each exercise indicating improved carryover between sessions. Patient will benefit from further skilled therapy to return to prior level of function.    Personal Factors and Comorbidities  Comorbidity 1;Comorbidity 2;Comorbidity 3+;Age    Comorbidities  HTN, Hyperlipidemia, Cancer    Examination-Activity Limitations  Carry;Stand;Stairs;Locomotion Level;Bend;Lift;Squat;Sit    Examination-Participation Restrictions  Cleaning;Laundry;Meal Prep    Stability/Clinical Decision Making  Stable/Uncomplicated    Rehab Potential  Good    PT Frequency  2x / week    PT Duration  6 weeks    PT Treatment/Interventions  ADLs/Self Care Home Management;Moist Heat;Functional mobility training;Therapeutic activities;Therapeutic exercise;Patient/family education;Neuromuscular re-education;Balance training;Stair training;Manual techniques;Passive range of motion;Joint Manipulations;Taping;Dry needling;Electrical Stimulation;Cryotherapy    PT Next Visit Plan  advance HEP. progress thoracic mobilization    PT Home Exercise Plan  Standing extension, thoracic SNAG    Consulted and Agree with Plan of Care  Patient       Patient will benefit from skilled therapeutic intervention in order to improve the following deficits and impairments:  Decreased balance, Decreased endurance, Decreased mobility, Difficulty walking, Hypomobility, Increased muscle spasms, Impaired sensation, Decreased range of motion, Improper body mechanics, Pain, Impaired flexibility, Decreased strength, Decreased activity tolerance, Abnormal gait  Visit Diagnosis: Chronic right-sided low back pain without sciatica  Pain in right hip  Muscle weakness (generalized)     Problem List Patient Active Problem List   Diagnosis Date Noted  . Overactive bladder  12/23/2018  . PAT (paroxysmal atrial tachycardia) ([place]) 09/30/2018  . Adult idiopathic generalized osteoporosis 06/04/2018  . OSA on CPAP 03/29/2018  . Recurrent UTI 12/27/2017  . Renal cyst 12/27/2017  . Rotator cuff tear, right 10/02/2017  . Complete rotator cuff tear 10/02/2017  . Tubular adenoma 05/27/2017  . Spondylolisthesis 05/13/2017  . Spinal stenosis of lumbar region 05/13/2017  . Neuritis of saphenous nerve 05/13/2017  . Lumbar radiculopathy 05/13/2017  . Essential hypertension 07/31/2016  . Renal artery stenosis ([place]) 07/31/2016  . DJD (degenerative joint disease) 07/31/2016  . Low serum vitamin D 07/09/2015  . Distal radius fracture, right 06/30/2015  . Hyperlipidemia, mixed 04/10/2015  . History of malignant melanoma 02/13/2014  . Pulmonary emboli ([place]) 06/17/2013  . Pernicious anemia 06/17/2013  . Lumbar disc disease 06/17/2013  . Cervical disc disease 06/17/2013  . Basal cell carcinoma of right cheek 04/28/2013  . S/P left TKA 08/11/2011    Blythe Stanford, PT DPT 03/21/2019, 1:48 PM  [facility] [address], [place], [address] Phone: [phone]   Fax:  [phone]  Name: Anita Bailey MRN: [id number] Date of Birth: Jun 05, 1934

## 2019-03-24 ENCOUNTER — Ambulatory Visit: Payer: Medicare Other

## 2019-03-24 ENCOUNTER — Other Ambulatory Visit: Payer: Self-pay

## 2019-03-24 DIAGNOSIS — M25551 Pain in right hip: Secondary | ICD-10-CM

## 2019-03-24 DIAGNOSIS — M6281 Muscle weakness (generalized): Secondary | ICD-10-CM

## 2019-03-24 DIAGNOSIS — M545 Low back pain, unspecified: Secondary | ICD-10-CM

## 2019-03-24 DIAGNOSIS — G8929 Other chronic pain: Secondary | ICD-10-CM

## 2019-03-24 NOTE — Therapy (Signed)
[facility] [address] [place], [address] Phone: [phone]   Fax:  [phone]  Physical Therapy Treatment  Patient Details  Name: Anita Bailey MRN: [id number] Date of Birth: 06-20-1934 Referring Provider (PT): Vladimir Crofts, MD   Encounter Date: 03/24/2019  PT End of Session - 03/24/19 1455    Visit Number  21    Number of Visits  24    Date for PT Re-Evaluation  04/11/19    Authorization Type  4/10    PT Start Time  [id number]    PT Stop Time  1500    PT Time Calculation (min)  45 min    Equipment Utilized During Treatment  Gait belt    Activity Tolerance  Patient tolerated treatment well;No increased pain    Behavior During Therapy  WFL for tasks assessed/performed       Past Medical History:  Diagnosis Date  . Anemia   . Arthritis   . Cancer ([place])    hx of melanoma in 1971, basal cell   . Cervical disc disease   . Chronic kidney disease    current uti 08/05/11   . Depression   . DVT (deep venous thrombosis) ([place]) 01/2015   was Eliquis until 06/13/15-   . Dysrhythmia    hx of heart racing - in past, "none  in years".  had a stress test > 7 years  . History of recurrent UTIs    "bladder infection"  Had a PICC line in past  . Hyperlipidemia   . Hypertension   . Melanoma ([place])   . Nephrolithiasis   . Pulmonary emboli ([facility])    bilateral 8/12 hospitalized   . Vitamin D deficiency     Past Surgical History:  Procedure Laterality Date  . ABDOMINAL HYSTERECTOMY     1981  . BACK SURGERY    . BREAST EXCISIONAL BIOPSY Right 1979   Negative  . COLONOSCOPY W/ POLYPECTOMY    . COLONOSCOPY WITH PROPOFOL N/A 04/29/2017   Procedure: COLONOSCOPY WITH PROPOFOL;  Surgeon: [name], MD;  Location: [facility] ENDOSCOPY;  Service: Endoscopy;  Laterality: N/A;  . JOINT REPLACEMENT     bilateral knee  . left wrist      surgery x 2   . OPEN REDUCTION INTERNAL FIXATION (ORIF) DISTAL RADIAL FRACTURE  Right 06/30/2015   Procedure: OPEN REDUCTION INTERNAL FIXATION (ORIF) RIGHT DISTAL RADIUS FRACTURE ;  Surgeon: [name], MD;  Location: [facility];  Service: Orthopedics;  Laterality: Right;  . OTHER SURGICAL HISTORY Right    leg - melonoma  . SHOULDER ARTHROSCOPY WITH OPEN ROTATOR CUFF REPAIR Right 10/02/2017   Procedure: Right shoulder mini open rotator cuff repair;  Surgeon: Susa Day, MD;  Location: [facility];  Service: Orthopedics;  Laterality: Right;  90 mins  . TONSILLECTOMY    . TOTAL KNEE ARTHROPLASTY  08/11/2011   Procedure: TOTAL KNEE ARTHROPLASTY;  Surgeon: [name], MD;  Location: [facility];  Service: Orthopedics;  Laterality: Left;  . TOTAL KNEE ARTHROPLASTY Left 2010    There were no vitals filed for this visit.  Subjective Assessment - 03/24/19 1421    Subjective  Patient states she's having some increase in R hip pain today. Patient states she saw Dr. Tonita Cong who reports it worked well.    Pertinent History  LBP. Pain onset 4-5 years ago insidiously. Patient has received multiple epidurals for managing the pain with good results. Patient reports  MRI positive for pinched nerve L3-4 and L5-S1. Attended physical therapy 9 visits with some improvement. Agg: flexion activities, sit <> stand, transfers. Ease: moving out of agg position; moving slowly and carefully. Reports HTN, Hyperlipidemia, Cancer (melanoma dx 1970), 2006 lumbar decompression for nerve pain    Limitations  Lifting;House hold activities    How long can you sit comfortably?  indefinitely but pain during transfer    How long can you stand comfortably?  indefinitely    How long can you walk comfortably?  indefinitely    Diagnostic tests  MRI positive as described above    Patient Stated Goals  Improve core strength; reduce pain    Currently in Pain?  Yes    Pain Score  5     Pain Location  Back    Pain Orientation  Right    Pain Descriptors / Indicators  Aching    Pain Type  Chronic pain    Pain Onset  More  than a month ago    Pain Frequency  Intermittent          TREATMENT Manual Therapy STM to gluteal musculature superior iliac crest with patient positioned in prone to decrease increased pain and spasms  Therapeutic Exercise Straight arm lumbar extension in standing - x20  Knee bent hip extension in prone - 2 x 10  Tennis ball mobilizations in standing against wall - x 6 min  Standing hip extension - x 10 Standing hip extension/abduction - x 10     Therapeutic Exercises performed to improve posterior chain strength  PT Education - 03/24/19 1454    Education Details  form/technique with exercise    Person(s) Educated  Patient    Methods  Demonstration;Explanation    Comprehension  Verbalized understanding;Returned demonstration       PT Short Term Goals - 01/11/19 1756      PT SHORT TERM GOAL #1   Title  Patient will demonstrate adherence to HEP 3x/wk as adjunct to clinical therapy to speed recovery and reduce total number of visits.    Baseline  HEP given    Time  2    Period  Weeks    Status  Achieved    Target Date  12/07/18        PT Long Term Goals - 03/14/19 1451      PT LONG TERM GOAL #1   Title  Patient will reduce score on MODI by 10% to achieve MCID and demonstrate reduced disability.    Baseline  26%; 01/11/2019 38%; 02/09/2019: 22%; 03/14/2019: 20%    Time  6    Period  Weeks    Status  On-going      PT LONG TERM GOAL #2   Title  Patient will report no increased pain with meal prep activities to indicate improved pain response with repeated flexion.    Baseline  9-10/10; 01/11/2019 9/10; 02/10/2019: 3/10; 03/14/2019: 4/10    Time  6    Period  Weeks    Status  On-going      PT LONG TERM GOAL #3   Title  Patient will report ability to perform 5 sit <> stand transfers without increase in pain and with good technique to demonstrate reduced pain with transfers.    Baseline  Hip adduction/UE assist with sit <> stand; reports increase in pain; 01/11/2019 able  to sit <> stand without UE or pain increase but hip adduction moment present; 02/09/2019: able to perform without pain.  Time  6    Period  Weeks    Status  Achieved            Plan - 03/24/19 1456    Clinical Impression Statement  Patient demonstrates increased muscular spasms along her R glute which is improved after manul therapy. Patient had increased pain with walking before manual therapy and decreased pain afterwards. COnitnued to focus on activating gluteal musculature during session. And patient will benefit form further skilled therapy to return to prior level of function.    Personal Factors and Comorbidities  Comorbidity 1;Comorbidity 2;Comorbidity 3+;Age    Comorbidities  HTN, Hyperlipidemia, Cancer    Examination-Activity Limitations  Carry;Stand;Stairs;Locomotion Level;Bend;Lift;Squat;Sit    Examination-Participation Restrictions  Cleaning;Laundry;Meal Prep    Stability/Clinical Decision Making  Stable/Uncomplicated    Rehab Potential  Good    PT Frequency  2x / week    PT Duration  6 weeks    PT Treatment/Interventions  ADLs/Self Care Home Management;Moist Heat;Functional mobility training;Therapeutic activities;Therapeutic exercise;Patient/family education;Neuromuscular re-education;Balance training;Stair training;Manual techniques;Passive range of motion;Joint Manipulations;Taping;Dry needling;Electrical Stimulation;Cryotherapy    PT Next Visit Plan  advance HEP. progress thoracic mobilization    PT Home Exercise Plan  Standing extension, thoracic SNAG    Consulted and Agree with Plan of Care  Patient       Patient will benefit from skilled therapeutic intervention in order to improve the following deficits and impairments:  Decreased balance, Decreased endurance, Decreased mobility, Difficulty walking, Hypomobility, Increased muscle spasms, Impaired sensation, Decreased range of motion, Improper body mechanics, Pain, Impaired flexibility, Decreased strength,  Decreased activity tolerance, Abnormal gait  Visit Diagnosis: Pain in right hip  Chronic right-sided low back pain without sciatica  Muscle weakness (generalized)     Problem List Patient Active Problem List   Diagnosis Date Noted  . Overactive bladder 12/23/2018  . PAT (paroxysmal atrial tachycardia) ([place]) 09/30/2018  . Adult idiopathic generalized osteoporosis 06/04/2018  . OSA on CPAP 03/29/2018  . Recurrent UTI 12/27/2017  . Renal cyst 12/27/2017  . Rotator cuff tear, right 10/02/2017  . Complete rotator cuff tear 10/02/2017  . Tubular adenoma 05/27/2017  . Spondylolisthesis 05/13/2017  . Spinal stenosis of lumbar region 05/13/2017  . Neuritis of saphenous nerve 05/13/2017  . Lumbar radiculopathy 05/13/2017  . Essential hypertension 07/31/2016  . Renal artery stenosis ([place]) 07/31/2016  . DJD (degenerative joint disease) 07/31/2016  . Low serum vitamin D 07/09/2015  . Distal radius fracture, right 06/30/2015  . Hyperlipidemia, mixed 04/10/2015  . History of malignant melanoma 02/13/2014  . Pulmonary emboli ([place]) 06/17/2013  . Pernicious anemia 06/17/2013  . Lumbar disc disease 06/17/2013  . Cervical disc disease 06/17/2013  . Basal cell carcinoma of right cheek 04/28/2013  . S/P left TKA 08/11/2011    Blythe Stanford, PT DPT 03/24/2019, 3:02 PM  [facility] [address] [place], [address] Phone: [phone]   Fax:  [phone]  Name: Anita Bailey MRN: [id number] Date of Birth: 1934-07-24

## 2019-03-28 ENCOUNTER — Ambulatory Visit: Payer: Medicare Other

## 2019-03-28 ENCOUNTER — Other Ambulatory Visit: Payer: Self-pay

## 2019-03-28 DIAGNOSIS — G8929 Other chronic pain: Secondary | ICD-10-CM

## 2019-03-28 DIAGNOSIS — M545 Low back pain, unspecified: Secondary | ICD-10-CM

## 2019-03-28 DIAGNOSIS — M25551 Pain in right hip: Secondary | ICD-10-CM

## 2019-03-28 DIAGNOSIS — M6281 Muscle weakness (generalized): Secondary | ICD-10-CM

## 2019-03-28 NOTE — Therapy (Signed)
[facility] [address], [place], [address] Phone: [phone]   Fax:  [phone]  Physical Therapy Treatment  Patient Details  Name: Anita Bailey MRN: [id number] Date of Birth: 1934-11-11 Referring Provider (PT): Vladimir Crofts, MD   Encounter Date: 03/28/2019  PT End of Session - 03/28/19 1352    Visit Number  22    Number of Visits  24    Date for PT Re-Evaluation  04/11/19    Authorization Type  5/10    PT Start Time  0945    PT Stop Time  1030    PT Time Calculation (min)  45 min    Equipment Utilized During Treatment  Gait belt    Activity Tolerance  Patient tolerated treatment well;No increased pain    Behavior During Therapy  WFL for tasks assessed/performed       Past Medical History:  Diagnosis Date  . Anemia   . Arthritis   . Cancer ([place])    hx of melanoma in 1971, basal cell   . Cervical disc disease   . Chronic kidney disease    current uti 08/05/11   . Depression   . DVT (deep venous thrombosis) ([place]) 01/2015   was Eliquis until 06/13/15-   . Dysrhythmia    hx of heart racing - in past, "none  in years".  had a stress test > 7 years  . History of recurrent UTIs    "bladder infection"  Had a PICC line in past  . Hyperlipidemia   . Hypertension   . Melanoma ([place])   . Nephrolithiasis   . Pulmonary emboli ([facility])    bilateral 8/12 hospitalized   . Vitamin D deficiency     Past Surgical History:  Procedure Laterality Date  . ABDOMINAL HYSTERECTOMY     1981  . BACK SURGERY    . BREAST EXCISIONAL BIOPSY Right 1979   Negative  . COLONOSCOPY W/ POLYPECTOMY    . COLONOSCOPY WITH PROPOFOL N/A 04/29/2017   Procedure: COLONOSCOPY WITH PROPOFOL;  Surgeon: [name], MD;  Location: [facility] END[facility]COPY;  Service: Endoscopy;  Laterality: N/A;  . JOINT REPLACEMENT     bilateral knee  . left wrist      surgery x 2   . OPEN REDUCTION INTERNAL FIXATION (ORIF) DISTAL RADIAL FRACTURE  Right 06/30/2015   Procedure: OPEN REDUCTION INTERNAL FIXATION (ORIF) RIGHT DISTAL RADIUS FRACTURE ;  Surgeon: [name], MD;  Location: [facility];  Service: Orthopedics;  Laterality: Right;  . OTHER SURGICAL HISTORY Right    leg - melonoma  . SHOULDER ARTHROSCOPY WITH OPEN ROTATOR CUFF REPAIR Right 10/02/2017   Procedure: Right shoulder mini open rotator cuff repair;  Surgeon: Susa Day, MD;  Location: [facility];  Service: Orthopedics;  Laterality: Right;  90 mins  . TONSILLECTOMY    . TOTAL KNEE ARTHROPLASTY  08/11/2011   Procedure: TOTAL KNEE ARTHROPLASTY;  Surgeon: [name], MD;  Location: [facility];  Service: Orthopedics;  Laterality: Left;  . TOTAL KNEE ARTHROPLASTY Left 2010    There were no vitals filed for this visit.  Subjective Assessment - 03/28/19 1301    Subjective  Patient reports no increased in pain but conitnues to have difficulty with bending and squatting into deep motions.    Pertinent History  LBP. Pain onset 4-5 years ago insidiously. Patient has received multiple epidurals for managing the pain with good results. Patient reports MRI positive for pinched  nerve L3-4 and L5-S1. Attended physical therapy 9 visits with some improvement. Agg: flexion activities, sit <> stand, transfers. Ease: moving out of agg position; moving slowly and carefully. Reports HTN, Hyperlipidemia, Cancer (melanoma dx 1970), 2006 lumbar decompression for nerve pain    Limitations  Lifting;House hold activities    How long can you sit comfortably?  indefinitely but pain during transfer    How long can you stand comfortably?  indefinitely    How long can you walk comfortably?  indefinitely    Diagnostic tests  MRI positive as described above    Patient Stated Goals  Improve core strength; reduce pain    Currently in Pain?  No/denies    Pain Onset  More than a month ago          TREATMENT Therapeutic Exercise Straight arm lumbar extension in standing - x20  Pulls for bent position with  GTB - x 20  Squat weight lift with 1-2# weight repeated to mimic lifting items from a dishwasher - x 20   Squatting diagonal cone lifts - x 20  **Education on proper lifting mechanics performed frequently without the session. Educated patient to perform movement throughout full AROM to not increase pain   Therapeutic Exercises performed to improve posterior chain strength    PT Education - 03/28/19 1351    Education Details  form/technique with exercise    Person(s) Educated  Patient    Methods  Explanation;Demonstration    Comprehension  Verbalized understanding;Returned demonstration       PT Short Term Goals - 01/11/19 1756      PT SHORT TERM GOAL #1   Title  Patient will demonstrate adherence to HEP 3x/wk as adjunct to clinical therapy to speed recovery and reduce total number of visits.    Baseline  HEP given    Time  2    Period  Weeks    Status  Achieved    Target Date  12/07/18        PT Long Term Goals - 03/14/19 1451      PT LONG TERM GOAL #1   Title  Patient will reduce score on MODI by 10% to achieve MCID and demonstrate reduced disability.    Baseline  26%; 01/11/2019 38%; 02/09/2019: 22%; 03/14/2019: 20%    Time  6    Period  Weeks    Status  On-going      PT LONG TERM GOAL #2   Title  Patient will report no increased pain with meal prep activities to indicate improved pain response with repeated flexion.    Baseline  9-10/10; 01/11/2019 9/10; 02/10/2019: 3/10; 03/14/2019: 4/10    Time  6    Period  Weeks    Status  On-going      PT LONG TERM GOAL #3   Title  Patient will report ability to perform 5 sit <> stand transfers without increase in pain and with good technique to demonstrate reduced pain with transfers.    Baseline  Hip adduction/UE assist with sit <> stand; reports increase in pain; 01/11/2019 able to sit <> stand without UE or pain increase but hip adduction moment present; 02/09/2019: able to perform without pain.    Time  6    Period  Weeks     Status  Achieved            Plan - 03/28/19 1352    Clinical Impression Statement  Patient demonstrates improvement with squatting technique with ability to  use greater amount of hip hinging to perform compared to previous session. Educated patient on proper squatting technique and patient was under the impression that she needed to use excessive lumbar flexion to perform properly. After education, patient after to reach items off the floor using a squatting or deadlift technique. Patient will benefit from further skilled therapy focused on improving strength and will benefit from further skilled therapy to return to prior level of function.    Personal Factors and Comorbidities  Comorbidity 1;Comorbidity 2;Comorbidity 3+;Age    Comorbidities  HTN, Hyperlipidemia, Cancer    Examination-Activity Limitations  Carry;Stand;Stairs;Locomotion Level;Bend;Lift;Squat;Sit    Examination-Participation Restrictions  Cleaning;Laundry;Meal Prep    Stability/Clinical Decision Making  Stable/Uncomplicated    Rehab Potential  Good    PT Frequency  2x / week    PT Duration  6 weeks    PT Treatment/Interventions  ADLs/Self Care Home Management;Moist Heat;Functional mobility training;Therapeutic activities;Therapeutic exercise;Patient/family education;Neuromuscular re-education;Balance training;Stair training;Manual techniques;Passive range of motion;Joint Manipulations;Taping;Dry needling;Electrical Stimulation;Cryotherapy    PT Next Visit Plan  advance HEP. progress thoracic mobilization    PT Home Exercise Plan  Standing extension, thoracic SNAG    Consulted and Agree with Plan of Care  Patient       Patient will benefit from skilled therapeutic intervention in order to improve the following deficits and impairments:  Decreased balance, Decreased endurance, Decreased mobility, Difficulty walking, Hypomobility, Increased muscle spasms, Impaired sensation, Decreased range of motion, Improper body mechanics,  Pain, Impaired flexibility, Decreased strength, Decreased activity tolerance, Abnormal gait  Visit Diagnosis: Pain in right hip  Chronic right-sided low back pain without sciatica  Muscle weakness (generalized)     Problem List Patient Active Problem List   Diagnosis Date Noted  . Overactive bladder 12/23/2018  . PAT (paroxysmal atrial tachycardia) ([place]) 09/30/2018  . Adult idiopathic generalized osteoporosis 06/04/2018  . OSA on CPAP 03/29/2018  . Recurrent UTI 12/27/2017  . Renal cyst 12/27/2017  . Rotator cuff tear, right 10/02/2017  . Complete rotator cuff tear 10/02/2017  . Tubular adenoma 05/27/2017  . Spondylolisthesis 05/13/2017  . Spinal stenosis of lumbar region 05/13/2017  . Neuritis of saphenous nerve 05/13/2017  . Lumbar radiculopathy 05/13/2017  . Essential hypertension 07/31/2016  . Renal artery stenosis ([place]) 07/31/2016  . DJD (degenerative joint disease) 07/31/2016  . Low serum vitamin D 07/09/2015  . Distal radius fracture, right 06/30/2015  . Hyperlipidemia, mixed 04/10/2015  . History of malignant melanoma 02/13/2014  . Pulmonary emboli ([place]) 06/17/2013  . Pernicious anemia 06/17/2013  . Lumbar disc disease 06/17/2013  . Cervical disc disease 06/17/2013  . Basal cell carcinoma of right cheek 04/28/2013  . S/P left TKA 08/11/2011    Blythe Stanford, PT DPT 03/28/2019, 1:55 PM  [facility] [address] [place], [address] Phone: [phone]   Fax:  [phone]  Name: CALLY GAZZOLA MRN: [id number] Date of Birth: Sep 18, 1934

## 2019-03-30 ENCOUNTER — Ambulatory Visit: Payer: Medicare Other

## 2019-03-30 ENCOUNTER — Other Ambulatory Visit: Payer: Self-pay

## 2019-03-30 DIAGNOSIS — M545 Low back pain, unspecified: Secondary | ICD-10-CM

## 2019-03-30 DIAGNOSIS — G8929 Other chronic pain: Secondary | ICD-10-CM

## 2019-03-30 DIAGNOSIS — M25551 Pain in right hip: Secondary | ICD-10-CM

## 2019-03-30 DIAGNOSIS — M6281 Muscle weakness (generalized): Secondary | ICD-10-CM

## 2019-03-30 NOTE — Therapy (Signed)
[facility] [address] [place], [address] Phone: [phone]   Fax:  [phone]  Physical Therapy Treatment  Patient Details  Name: Anita Bailey MRN: [id number] Date of Birth: 1934-03-09 Referring Provider (PT): Vladimir Crofts, MD   Encounter Date: 03/30/2019  PT End of Session - 03/30/19 1046    Visit Number  23    Number of Visits  24    Date for PT Re-Evaluation  04/11/19    Authorization Type  6/10    PT Start Time  1030    PT Stop Time  1115    PT Time Calculation (min)  45 min    Equipment Utilized During Treatment  Gait belt    Activity Tolerance  Patient tolerated treatment well;No increased pain    Behavior During Therapy  WFL for tasks assessed/performed       Past Medical History:  Diagnosis Date  . Anemia   . Arthritis   . Cancer ([place])    hx of melanoma in 1971, basal cell   . Cervical disc disease   . Chronic kidney disease    current uti 08/05/11   . Depression   . DVT (deep venous thrombosis) ([place]) 01/2015   was Eliquis until 06/13/15-   . Dysrhythmia    hx of heart racing - in past, "none  in years".  had a stress test > 7 years  . History of recurrent UTIs    "bladder infection"  Had a PICC line in past  . Hyperlipidemia   . Hypertension   . Melanoma ([place])   . Nephrolithiasis   . Pulmonary emboli ([facility])    bilateral 8/12 hospitalized   . Vitamin D deficiency     Past Surgical History:  Procedure Laterality Date  . ABDOMINAL HYSTERECTOMY     1981  . BACK SURGERY    . BREAST EXCISIONAL BIOPSY Right 1979   Negative  . COLONOSCOPY W/ POLYPECTOMY    . COLONOSCOPY WITH PROPOFOL N/A 04/29/2017   Procedure: COLONOSCOPY WITH PROPOFOL;  Surgeon: [name], MD;  Location: [facility] ENDOSCOPY;  Service: Endoscopy;  Laterality: N/A;  . JOINT REPLACEMENT     bilateral knee  . left wrist      surgery x 2   . OPEN REDUCTION INTERNAL FIXATION (ORIF) DISTAL RADIAL FRACTURE  Right 06/30/2015   Procedure: OPEN REDUCTION INTERNAL FIXATION (ORIF) RIGHT DISTAL RADIUS FRACTURE ;  Surgeon: [name], MD;  Location: [facility];  Service: Orthopedics;  Laterality: Right;  . OTHER SURGICAL HISTORY Right    leg - melonoma  . SHOULDER ARTHROSCOPY WITH OPEN ROTATOR CUFF REPAIR Right 10/02/2017   Procedure: Right shoulder mini open rotator cuff repair;  Surgeon: Susa Day, MD;  Location: [facility];  Service: Orthopedics;  Laterality: Right;  90 mins  . TONSILLECTOMY    . TOTAL KNEE ARTHROPLASTY  08/11/2011   Procedure: TOTAL KNEE ARTHROPLASTY;  Surgeon: [name], MD;  Location: [facility];  Service: Orthopedics;  Laterality: Left;  . TOTAL KNEE ARTHROPLASTY Left 2010    There were no vitals filed for this visit.  Subjective Assessment - 03/30/19 1041    Subjective  Patient states no major changes since the previous session.. Patient states she has been performing exercises.    Pertinent History  LBP. Pain onset 4-5 years ago insidiously. Patient has received multiple epidurals for managing the pain with good results. Patient reports MRI positive for pinched nerve L3-4  and L5-S1. Attended physical therapy 9 visits with some improvement. Agg: flexion activities, sit <> stand, transfers. Ease: moving out of agg position; moving slowly and carefully. Reports HTN, Hyperlipidemia, Cancer (melanoma dx 1970), 2006 lumbar decompression for nerve pain    Limitations  Lifting;House hold activities    How long can you sit comfortably?  indefinitely but pain during transfer    How long can you stand comfortably?  indefinitely    How long can you walk comfortably?  indefinitely    Diagnostic tests  MRI positive as described above    Patient Stated Goals  Improve core strength; reduce pain    Currently in Pain?  No/denies    Pain Onset  More than a month ago         -- TREATMENT Therapeutic Exercise  Lumbar Pulls for bent position with GTB - x 20  Straight arm lumbar extension in  standing - x20 YTB  Standing weight lifts from squatting positioning - x10 10#  Rotational squatting with 2 4# weights - x 10 in both sides  Overhead press with 4# weights - x5 3# weights x5 Transfers off the floor -- x 3 **Education on proper lifting mechanics performed frequently without the session. Educated patient to perform movement throughout full AROM to not increase pain   Therapeutic Exercises performed to improve posterior chain strength   PT Education - 03/30/19 1046    Education Details  form/technique with exercise    Person(s) Educated  Patient    Methods  Explanation;Demonstration    Comprehension  Verbalized understanding;Returned demonstration       PT Short Term Goals - 01/11/19 1756      PT SHORT TERM GOAL #1   Title  Patient will demonstrate adherence to HEP 3x/wk as adjunct to clinical therapy to speed recovery and reduce total number of visits.    Baseline  HEP given    Time  2    Period  Weeks    Status  Achieved    Target Date  12/07/18        PT Long Term Goals - 03/14/19 1451      PT LONG TERM GOAL #1   Title  Patient will reduce score on MODI by 10% to achieve MCID and demonstrate reduced disability.    Baseline  26%; 01/11/2019 38%; 02/09/2019: 22%; 03/14/2019: 20%    Time  6    Period  Weeks    Status  On-going      PT LONG TERM GOAL #2   Title  Patient will report no increased pain with meal prep activities to indicate improved pain response with repeated flexion.    Baseline  9-10/10; 01/11/2019 9/10; 02/10/2019: 3/10; 03/14/2019: 4/10    Time  6    Period  Weeks    Status  On-going      PT LONG TERM GOAL #3   Title  Patient will report ability to perform 5 sit <> stand transfers without increase in pain and with good technique to demonstrate reduced pain with transfers.    Baseline  Hip adduction/UE assist with sit <> stand; reports increase in pain; 01/11/2019 able to sit <> stand without UE or pain increase but hip adduction moment  present; 02/09/2019: able to perform without pain.    Time  6    Period  Weeks    Status  Achieved            Plan - 03/30/19 1100  Clinical Impression Statement  Patient demonstrates improvement with squat lifts today with ability to perform with 10# versus lighter weights compared to previous sessions. Patient able to perform exercises with rotational movements in the bottom position indicating further improvement. Addressed transfers off the floor today, patient requires significant cueing to perform but able to perform independently by end of session. Patient is improving overall and will benefit from further skilled therapy to return to prior level of function.    Personal Factors and Comorbidities  Comorbidity 1;Comorbidity 2;Comorbidity 3+;Age    Comorbidities  HTN, Hyperlipidemia, Cancer    Examination-Activity Limitations  Carry;Stand;Stairs;Locomotion Level;Bend;Lift;Squat;Sit    Examination-Participation Restrictions  Cleaning;Laundry;Meal Prep    Stability/Clinical Decision Making  Stable/Uncomplicated    Rehab Potential  Good    PT Frequency  2x / week    PT Duration  6 weeks    PT Treatment/Interventions  ADLs/Self Care Home Management;Moist Heat;Functional mobility training;Therapeutic activities;Therapeutic exercise;Patient/family education;Neuromuscular re-education;Balance training;Stair training;Manual techniques;Passive range of motion;Joint Manipulations;Taping;Dry needling;Electrical Stimulation;Cryotherapy    PT Next Visit Plan  advance HEP. progress thoracic mobilization    PT Home Exercise Plan  Standing extension, thoracic SNAG    Consulted and Agree with Plan of Care  Patient       Patient will benefit from skilled therapeutic intervention in order to improve the following deficits and impairments:  Decreased balance, Decreased endurance, Decreased mobility, Difficulty walking, Hypomobility, Increased muscle spasms, Impaired sensation, Decreased range of  motion, Improper body mechanics, Pain, Impaired flexibility, Decreased strength, Decreased activity tolerance, Abnormal gait  Visit Diagnosis: Pain in right hip  Chronic right-sided low back pain without sciatica  Muscle weakness (generalized)     Problem List Patient Active Problem List   Diagnosis Date Noted  . Overactive bladder 12/23/2018  . PAT (paroxysmal atrial tachycardia) ([place]) 09/30/2018  . Adult idiopathic generalized osteoporosis 06/04/2018  . OSA on CPAP 03/29/2018  . Recurrent UTI 12/27/2017  . Renal cyst 12/27/2017  . Rotator cuff tear, right 10/02/2017  . Complete rotator cuff tear 10/02/2017  . Tubular adenoma 05/27/2017  . Spondylolisthesis 05/13/2017  . Spinal stenosis of lumbar region 05/13/2017  . Neuritis of saphenous nerve 05/13/2017  . Lumbar radiculopathy 05/13/2017  . Essential hypertension 07/31/2016  . Renal artery stenosis ([place]) 07/31/2016  . DJD (degenerative joint disease) 07/31/2016  . Low serum vitamin D 07/09/2015  . Distal radius fracture, right 06/30/2015  . Hyperlipidemia, mixed 04/10/2015  . History of malignant melanoma 02/13/2014  . Pulmonary emboli ([place]) 06/17/2013  . Pernicious anemia 06/17/2013  . Lumbar disc disease 06/17/2013  . Cervical disc disease 06/17/2013  . Basal cell carcinoma of right cheek 04/28/2013  . S/P left TKA 08/11/2011    Blythe Stanford, PT DPT 03/30/2019, 11:51 AM  [facility] [address] [place], [address] Phone: [phone]   Fax:  [phone]  Name: Anita Bailey MRN: [id number] Date of Birth: 08/20/1934

## 2019-04-05 ENCOUNTER — Ambulatory Visit: Payer: Medicare Other

## 2019-04-05 ENCOUNTER — Other Ambulatory Visit: Payer: Self-pay

## 2019-04-05 DIAGNOSIS — G8929 Other chronic pain: Secondary | ICD-10-CM

## 2019-04-05 DIAGNOSIS — M545 Low back pain, unspecified: Secondary | ICD-10-CM

## 2019-04-05 DIAGNOSIS — M25551 Pain in right hip: Secondary | ICD-10-CM

## 2019-04-05 DIAGNOSIS — M6281 Muscle weakness (generalized): Secondary | ICD-10-CM

## 2019-04-05 NOTE — Therapy (Signed)
[facility] [address], [place], [address] Phone: [phone]   Fax:  [phone]  Physical Therapy Treatment  Patient Details  Name: Anita Bailey MRN: [id number] Date of Birth: 1934/08/10 Referring Provider (PT): Vladimir Crofts, MD   Encounter Date: 04/05/2019  PT End of Session - 04/05/19 1504    Visit Number  24    Number of Visits  24    Date for PT Re-Evaluation  04/11/19    Authorization Type  7/10    PT Start Time  1430    PT Stop Time  1515    PT Time Calculation (min)  45 min    Equipment Utilized During Treatment  Gait belt    Activity Tolerance  Patient tolerated treatment well;No increased pain    Behavior During Therapy  WFL for tasks assessed/performed       Past Medical History:  Diagnosis Date  . Anemia   . Arthritis   . Cancer ([place])    hx of melanoma in 1971, basal cell   . Cervical disc disease   . Chronic kidney disease    current uti 08/05/11   . Depression   . DVT (deep venous thrombosis) ([place]) 01/2015   was Eliquis until 06/13/15-   . Dysrhythmia    hx of heart racing - in past, "none  in years".  had a stress test > 7 years  . History of recurrent UTIs    "bladder infection"  Had a PICC line in past  . Hyperlipidemia   . Hypertension   . Melanoma ([place])   . Nephrolithiasis   . Pulmonary emboli ([facility])    bilateral 8/12 hospitalized   . Vitamin D deficiency     Past Surgical History:  Procedure Laterality Date  . ABDOMINAL HYSTERECTOMY     1981  . BACK SURGERY    . BREAST EXCISIONAL BIOPSY Right 1979   Negative  . COLONOSCOPY W/ POLYPECTOMY    . COLONOSCOPY WITH PROPOFOL N/A 04/29/2017   Procedure: COLONOSCOPY WITH PROPOFOL;  Surgeon: [name], MD;  Location: [facility] ENDOSCOPY;  Service: Endoscopy;  Laterality: N/A;  . JOINT REPLACEMENT     bilateral knee  . left wrist      surgery x 2   . OPEN REDUCTION INTERNAL FIXATION (ORIF) DISTAL RADIAL FRACTURE  Right 06/30/2015   Procedure: OPEN REDUCTION INTERNAL FIXATION (ORIF) RIGHT DISTAL RADIUS FRACTURE ;  Surgeon: [name], MD;  Location: [facility];  Service: Orthopedics;  Laterality: Right;  . OTHER SURGICAL HISTORY Right    leg - melonoma  . SHOULDER ARTHROSCOPY WITH OPEN ROTATOR CUFF REPAIR Right 10/02/2017   Procedure: Right shoulder mini open rotator cuff repair;  Surgeon: Susa Day, MD;  Location: [facility] ORS;  Service: Orthopedics;  Laterality: Right;  90 mins  . TONSILLECTOMY    . TOTAL KNEE ARTHROPLASTY  08/11/2011   Procedure: TOTAL KNEE ARTHROPLASTY;  Surgeon: [name], MD;  Location: [facility];  Service: Orthopedics;  Laterality: Left;  . TOTAL KNEE ARTHROPLASTY Left 2010    There were no vitals filed for this visit.  Subjective Assessment - 04/05/19 1436    Subjective  Patient reports she raked her yard yesterday which she states her back is feeling slightly sore. Patient states no major changes otherwises.    Pertinent History  LBP. Pain onset 4-5 years ago insidiously. Patient has received multiple epidurals for managing the pain with good results. Patient reports  MRI positive for pinched nerve L3-4 and L5-S1. Attended physical therapy 9 visits with some improvement. Agg: flexion activities, sit <> stand, transfers. Ease: moving out of agg position; moving slowly and carefully. Reports HTN, Hyperlipidemia, Cancer (melanoma dx 1970), 2006 lumbar decompression for nerve pain    Limitations  Lifting;House hold activities    How long can you sit comfortably?  indefinitely but pain during transfer    How long can you stand comfortably?  indefinitely    How long can you walk comfortably?  indefinitely    Diagnostic tests  MRI positive as described above    Patient Stated Goals  Improve core strength; reduce pain    Currently in Pain?  No/denies    Pain Onset  More than a month ago          TREATMENT Therapeutic Exercise LTRs with feet on the ball - x 30 Knees to chest with  feet on ball - x 30 Scapular retraction in sitting - x 20  Manual therapy STM performed to the thoracic extensors with patient positioned in supine to decrease increased spasms and pain with patient positioned in prone during the manual therapy    Therapeutic Exercises and manual therapy to decrease pain in the affected area    PT Education - 04/05/19 1503    Education Details  form/technique with exercise    Person(s) Educated  Patient    Methods  Explanation;Demonstration    Comprehension  Verbalized understanding;Returned demonstration       PT Short Term Goals - 01/11/19 1756      PT SHORT TERM GOAL #1   Title  Patient will demonstrate adherence to HEP 3x/wk as adjunct to clinical therapy to speed recovery and reduce total number of visits.    Baseline  HEP given    Time  2    Period  Weeks    Status  Achieved    Target Date  12/07/18        PT Long Term Goals - 03/14/19 1451      PT LONG TERM GOAL #1   Title  Patient will reduce score on MODI by 10% to achieve MCID and demonstrate reduced disability.    Baseline  26%; 01/11/2019 38%; 02/09/2019: 22%; 03/14/2019: 20%    Time  6    Period  Weeks    Status  On-going      PT LONG TERM GOAL #2   Title  Patient will report no increased pain with meal prep activities to indicate improved pain response with repeated flexion.    Baseline  9-10/10; 01/11/2019 9/10; 02/10/2019: 3/10; 03/14/2019: 4/10    Time  6    Period  Weeks    Status  On-going      PT LONG TERM GOAL #3   Title  Patient will report ability to perform 5 sit <> stand transfers without increase in pain and with good technique to demonstrate reduced pain with transfers.    Baseline  Hip adduction/UE assist with sit <> stand; reports increase in pain; 01/11/2019 able to sit <> stand without UE or pain increase but hip adduction moment present; 02/09/2019: able to perform without pain.    Time  6    Period  Weeks    Status  Achieved            Plan -  04/05/19 1507    Clinical Impression Statement  Increased soreness and fatigue today after raking in the yard all of yesterday  and focused on decreasing muscular spasms during today's session to improve guarding and decrease overall pain. Focused on mobility exercises and STM to decrease pain. Patient with decreased pain at end of the session indicating improvement with muscular guarding. Patient will benefit from further skilled therapy    Personal Factors and Comorbidities  Comorbidity 1;Comorbidity 2;Comorbidity 3+;Age    Comorbidities  HTN, Hyperlipidemia, Cancer    Examination-Activity Limitations  Carry;Stand;Stairs;Locomotion Level;Bend;Lift;Squat;Sit    Examination-Participation Restrictions  Cleaning;Laundry;Meal Prep    Stability/Clinical Decision Making  Stable/Uncomplicated    Rehab Potential  Good    PT Frequency  2x / week    PT Duration  6 weeks    PT Treatment/Interventions  ADLs/Self Care Home Management;Moist Heat;Functional mobility training;Therapeutic activities;Therapeutic exercise;Patient/family education;Neuromuscular re-education;Balance training;Stair training;Manual techniques;Passive range of motion;Joint Manipulations;Taping;Dry needling;Electrical Stimulation;Cryotherapy    PT Next Visit Plan  advance HEP. progress thoracic mobilization    PT Home Exercise Plan  Standing extension, thoracic SNAG    Consulted and Agree with Plan of Care  Patient       Patient will benefit from skilled therapeutic intervention in order to improve the following deficits and impairments:  Decreased balance, Decreased endurance, Decreased mobility, Difficulty walking, Hypomobility, Increased muscle spasms, Impaired sensation, Decreased range of motion, Improper body mechanics, Pain, Impaired flexibility, Decreased strength, Decreased activity tolerance, Abnormal gait  Visit Diagnosis: Pain in right hip  Chronic right-sided low back pain without sciatica  Muscle weakness  (generalized)     Problem List Patient Active Problem List   Diagnosis Date Noted  . Overactive bladder 12/23/2018  . PAT (paroxysmal atrial tachycardia) ([place]) 09/30/2018  . Adult idiopathic generalized osteoporosis 06/04/2018  . OSA on CPAP 03/29/2018  . Recurrent UTI 12/27/2017  . Renal cyst 12/27/2017  . Rotator cuff tear, right 10/02/2017  . Complete rotator cuff tear 10/02/2017  . Tubular adenoma 05/27/2017  . Spondylolisthesis 05/13/2017  . Spinal stenosis of lumbar region 05/13/2017  . Neuritis of saphenous nerve 05/13/2017  . Lumbar radiculopathy 05/13/2017  . Essential hypertension 07/31/2016  . Renal artery stenosis ([place]) 07/31/2016  . DJD (degenerative joint disease) 07/31/2016  . Low serum vitamin D 07/09/2015  . Distal radius fracture, right 06/30/2015  . Hyperlipidemia, mixed 04/10/2015  . History of malignant melanoma 02/13/2014  . Pulmonary emboli ([place]) 06/17/2013  . Pernicious anemia 06/17/2013  . Lumbar disc disease 06/17/2013  . Cervical disc disease 06/17/2013  . Basal cell carcinoma of right cheek 04/28/2013  . S/P left TKA 08/11/2011    Blythe Stanford, PT DPT 04/05/2019, 3:42 PM  [facility] [address] [place], [address] Phone: [phone]   Fax:  [phone]  Name: RIYAH TEIG MRN: [id number] Date of Birth: 10-05-1934

## 2019-04-07 ENCOUNTER — Ambulatory Visit: Payer: Medicare Other

## 2019-04-11 ENCOUNTER — Other Ambulatory Visit: Payer: Self-pay

## 2019-04-11 ENCOUNTER — Ambulatory Visit: Payer: Medicare Other

## 2019-04-11 DIAGNOSIS — G8929 Other chronic pain: Secondary | ICD-10-CM

## 2019-04-11 DIAGNOSIS — M25551 Pain in right hip: Secondary | ICD-10-CM

## 2019-04-11 DIAGNOSIS — M545 Low back pain, unspecified: Secondary | ICD-10-CM

## 2019-04-11 DIAGNOSIS — M6281 Muscle weakness (generalized): Secondary | ICD-10-CM

## 2019-04-11 NOTE — Therapy (Signed)
[facility] [address] [place], [address] Phone: [phone]   Fax:  [phone]  Physical Therapy Treatment  Patient Details  Name: Anita Bailey MRN: [id number] Date of Birth: 04-06-1934 Referring Provider (PT): Vladimir Crofts, MD   Encounter Date: 04/11/2019  PT End of Session - 04/11/19 1052    Visit Number  25    Number of Visits  25    Date for PT Re-Evaluation  04/11/19    Authorization Type  8/10    PT Start Time  1030    PT Stop Time  1115    PT Time Calculation (min)  45 min    Equipment Utilized During Treatment  Gait belt    Activity Tolerance  Patient tolerated treatment well;No increased pain    Behavior During Therapy  WFL for tasks assessed/performed       Past Medical History:  Diagnosis Date  . Anemia   . Arthritis   . Cancer ([place])    hx of melanoma in 1971, basal cell   . Cervical disc disease   . Chronic kidney disease    current uti 08/05/11   . Depression   . DVT (deep venous thrombosis) ([place]) 01/2015   was Eliquis until 06/13/15-   . Dysrhythmia    hx of heart racing - in past, "none  in years".  had a stress test > 7 years  . History of recurrent UTIs    "bladder infection"  Had a PICC line in past  . Hyperlipidemia   . Hypertension   . Melanoma ([place])   . Nephrolithiasis   . Pulmonary emboli ([facility])    bilateral 8/12 hospitalized   . Vitamin D deficiency     Past Surgical History:  Procedure Laterality Date  . ABDOMINAL HYSTERECTOMY     1981  . BACK SURGERY    . BREAST EXCISIONAL BIOPSY Right 1979   Negative  . COLONOSCOPY W/ POLYPECTOMY    . COLONOSCOPY WITH PROPOFOL N/A 04/29/2017   Procedure: COLONOSCOPY WITH PROPOFOL;  Surgeon: [name], MD;  Location: [facility] ENDOSCOPY;  Service: Endoscopy;  Laterality: N/A;  . JOINT REPLACEMENT     bilateral knee  . left wrist      surgery x 2   . OPEN REDUCTION INTERNAL FIXATION (ORIF) DISTAL RADIAL FRACTURE  Right 06/30/2015   Procedure: OPEN REDUCTION INTERNAL FIXATION (ORIF) RIGHT DISTAL RADIUS FRACTURE ;  Surgeon: [name], MD;  Location: [facility];  Service: Orthopedics;  Laterality: Right;  . OTHER SURGICAL HISTORY Right    leg - melonoma  . SHOULDER ARTHROSCOPY WITH OPEN ROTATOR CUFF REPAIR Right 10/02/2017   Procedure: Right shoulder mini open rotator cuff repair;  Surgeon: Susa Day, MD;  Location: [facility];  Service: Orthopedics;  Laterality: Right;  90 mins  . TONSILLECTOMY    . TOTAL KNEE ARTHROPLASTY  08/11/2011   Procedure: TOTAL KNEE ARTHROPLASTY;  Surgeon: [name], MD;  Location: [facility];  Service: Orthopedics;  Laterality: Left;  . TOTAL KNEE ARTHROPLASTY Left 2010    There were no vitals filed for this visit.  Subjective Assessment - 04/11/19 1036    Subjective  Patient reports she has been helping her family with moving items in the house. Patient states her daughter is in town.    Pertinent History  LBP. Pain onset 4-5 years ago insidiously. Patient has received multiple epidurals for managing the pain with good results. Patient reports MRI  positive for pinched nerve L3-4 and L5-S1. Attended physical therapy 9 visits with some improvement. Agg: flexion activities, sit <> stand, transfers. Ease: moving out of agg position; moving slowly and carefully. Reports HTN, Hyperlipidemia, Cancer (melanoma dx 1970), 2006 lumbar decompression for nerve pain    Limitations  Lifting;House hold activities    How long can you sit comfortably?  indefinitely but pain during transfer    How long can you stand comfortably?  indefinitely    How long can you walk comfortably?  indefinitely    Diagnostic tests  MRI positive as described above    Patient Stated Goals  Improve core strength; reduce pain    Currently in Pain?  No/denies    Pain Onset  More than a month ago          Therapeutic Exercise  Lumbar Pulls for bent position with GTB - x 20  Standing rotational lumbar twists  with GTB with arms straight - x 10  Standing squats with 10# -- x 20  Overhead press with 10# weight - x5 8# weights x7 Suitcase carry - 237ft with weight in R UE; 155ft with weight in L UE   Push out paloff press with 8# weight - x 8 **Education on proper lifting mechanics performed frequently without the session. Educated patient to perform movement throughout full AROM to not increase pain   Therapeutic Exercises performed to improve posterior chain strength   PT Education - 04/11/19 1050    Education Details  form/technique with exercise    Person(s) Educated  Patient    Methods  Explanation;Demonstration    Comprehension  Verbalized understanding;Returned demonstration       PT Short Term Goals - 01/11/19 1756      PT SHORT TERM GOAL #1   Title  Patient will demonstrate adherence to HEP 3x/wk as adjunct to clinical therapy to speed recovery and reduce total number of visits.    Baseline  HEP given    Time  2    Period  Weeks    Status  Achieved    Target Date  12/07/18        PT Long Term Goals - 03/14/19 1451      PT LONG TERM GOAL #1   Title  Patient will reduce score on MODI by 10% to achieve MCID and demonstrate reduced disability.    Baseline  26%; 01/11/2019 38%; 02/09/2019: 22%; 03/14/2019: 20%    Time  6    Period  Weeks    Status  On-going      PT LONG TERM GOAL #2   Title  Patient will report no increased pain with meal prep activities to indicate improved pain response with repeated flexion.    Baseline  9-10/10; 01/11/2019 9/10; 02/10/2019: 3/10; 03/14/2019: 4/10    Time  6    Period  Weeks    Status  On-going      PT LONG TERM GOAL #3   Title  Patient will report ability to perform 5 sit <> stand transfers without increase in pain and with good technique to demonstrate reduced pain with transfers.    Baseline  Hip adduction/UE assist with sit <> stand; reports increase in pain; 01/11/2019 able to sit <> stand without UE or pain increase but hip adduction  moment present; 02/09/2019: able to perform without pain.    Time  6    Period  Weeks    Status  Achieved  Plan - 04/11/19 1054    Clinical Impression Statement  Continued to focus on improving low back and hip strength with standing ativities to improve core stabilization and allow for performance of functional activities such as lawn care. Patient is improving overall in terms of functional capacity as she's able to bend with less overall pain and greater strength. Patient will benefit from further skilled therapy focused on improving limitations to return to prior level of function.    Personal Factors and Comorbidities  Comorbidity 1;Comorbidity 2;Comorbidity 3+;Age    Comorbidities  HTN, Hyperlipidemia, Cancer    Examination-Activity Limitations  Carry;Stand;Stairs;Locomotion Level;Bend;Lift;Squat;Sit    Examination-Participation Restrictions  Cleaning;Laundry;Meal Prep    Stability/Clinical Decision Making  Stable/Uncomplicated    Rehab Potential  Good    PT Frequency  2x / week    PT Duration  6 weeks    PT Treatment/Interventions  ADLs/Self Care Home Management;Moist Heat;Functional mobility training;Therapeutic activities;Therapeutic exercise;Patient/family education;Neuromuscular re-education;Balance training;Stair training;Manual techniques;Passive range of motion;Joint Manipulations;Taping;Dry needling;Electrical Stimulation;Cryotherapy    PT Next Visit Plan  advance HEP. progress thoracic mobilization    PT Home Exercise Plan  Standing extension, thoracic SNAG    Consulted and Agree with Plan of Care  Patient       Patient will benefit from skilled therapeutic intervention in order to improve the following deficits and impairments:  Decreased balance, Decreased endurance, Decreased mobility, Difficulty walking, Hypomobility, Increased muscle spasms, Impaired sensation, Decreased range of motion, Improper body mechanics, Pain, Impaired flexibility, Decreased  strength, Decreased activity tolerance, Abnormal gait  Visit Diagnosis: Pain in right hip  Chronic right-sided low back pain without sciatica  Muscle weakness (generalized)     Problem List Patient Active Problem List   Diagnosis Date Noted  . Overactive bladder 12/23/2018  . PAT (paroxysmal atrial tachycardia) ([place]) 09/30/2018  . Adult idiopathic generalized osteoporosis 06/04/2018  . OSA on CPAP 03/29/2018  . Recurrent UTI 12/27/2017  . Renal cyst 12/27/2017  . Rotator cuff tear, right 10/02/2017  . Complete rotator cuff tear 10/02/2017  . Tubular adenoma 05/27/2017  . Spondylolisthesis 05/13/2017  . Spinal stenosis of lumbar region 05/13/2017  . Neuritis of saphenous nerve 05/13/2017  . Lumbar radiculopathy 05/13/2017  . Essential hypertension 07/31/2016  . Renal artery stenosis ([place]) 07/31/2016  . DJD (degenerative joint disease) 07/31/2016  . Low serum vitamin D 07/09/2015  . Distal radius fracture, right 06/30/2015  . Hyperlipidemia, mixed 04/10/2015  . History of malignant melanoma 02/13/2014  . Pulmonary emboli ([place]) 06/17/2013  . Pernicious anemia 06/17/2013  . Lumbar disc disease 06/17/2013  . Cervical disc disease 06/17/2013  . Basal cell carcinoma of right cheek 04/28/2013  . S/P left TKA 08/11/2011    Blythe Stanford, PT DPT 04/11/2019, 11:16 AM  [facility] [address], [place], [address] Phone: [phone]   Fax:  [phone]  Name: Anita Bailey MRN: [id number] Date of Birth: [birth year]

## 2019-04-13 ENCOUNTER — Ambulatory Visit: Payer: Medicare Other

## 2019-04-20 ENCOUNTER — Other Ambulatory Visit: Payer: Self-pay

## 2019-04-20 ENCOUNTER — Ambulatory Visit: Payer: Medicare Other | Attending: Specialist

## 2019-04-20 DIAGNOSIS — M6281 Muscle weakness (generalized): Secondary | ICD-10-CM | POA: Diagnosis present

## 2019-04-20 DIAGNOSIS — M25551 Pain in right hip: Secondary | ICD-10-CM | POA: Insufficient documentation

## 2019-04-20 DIAGNOSIS — M545 Low back pain, unspecified: Secondary | ICD-10-CM

## 2019-04-20 DIAGNOSIS — G8929 Other chronic pain: Secondary | ICD-10-CM | POA: Diagnosis present

## 2019-04-20 NOTE — Therapy (Signed)
[facility] [address], [place], [address] Phone: [phone]   Fax:  [phone]  Physical Therapy Treatment  Patient Details  Name: Anita Bailey MRN: [id number] Date of Birth: August 23, 1934 Referring Provider (PT): Vladimir Crofts, MD   Encounter Date: 04/20/2019  PT End of Session - 04/20/19 1006    Visit Number  26    Number of Visits  30    Date for PT Re-Evaluation  04/11/19    Authorization Type  9/10    PT Start Time  0945    PT Stop Time  1030    PT Time Calculation (min)  45 min    Equipment Utilized During Treatment  Gait belt    Activity Tolerance  Patient tolerated treatment well;No increased pain    Behavior During Therapy  WFL for tasks assessed/performed       Past Medical History:  Diagnosis Date  . Anemia   . Arthritis   . Cancer ([place])    hx of melanoma in 1971, basal cell   . Cervical disc disease   . Chronic kidney disease    current uti 08/05/11   . Depression   . DVT (deep venous thrombosis) ([place]) 01/2015   was Eliquis until 06/13/15-   . Dysrhythmia    hx of heart racing - in past, "none  in years".  had a stress test > 7 years  . History of recurrent UTIs    "bladder infection"  Had a PICC line in past  . Hyperlipidemia   . Hypertension   . Melanoma ([place])   . Nephrolithiasis   . Pulmonary emboli ([facility])    bilateral 8/12 hospitalized   . Vitamin D deficiency     Past Surgical History:  Procedure Laterality Date  . ABDOMINAL HYSTERECTOMY     1981  . BACK SURGERY    . BREAST EXCISIONAL BIOPSY Right 1979   Negative  . COLONOSCOPY W/ POLYPECTOMY    . COLONOSCOPY WITH PROPOFOL N/A 04/29/2017   Procedure: COLONOSCOPY WITH PROPOFOL;  Surgeon: [name], MD;  Location: [facility] ENDOSCOPY;  Service: Endoscopy;  Laterality: N/A;  . JOINT REPLACEMENT     bilateral knee  . left wrist      surgery x 2   . OPEN REDUCTION INTERNAL FIXATION (ORIF) DISTAL RADIAL FRACTURE Right  06/30/2015   Procedure: OPEN REDUCTION INTERNAL FIXATION (ORIF) RIGHT DISTAL RADIUS FRACTURE ;  Surgeon: [name], MD;  Location: [facility];  Service: Orthopedics;  Laterality: Right;  . OTHER SURGICAL HISTORY Right    leg - melonoma  . SHOULDER ARTHROSCOPY WITH OPEN ROTATOR CUFF REPAIR Right 10/02/2017   Procedure: Right shoulder mini open rotator cuff repair;  Surgeon: Susa Day, MD;  Location: [facility];  Service: Orthopedics;  Laterality: Right;  90 mins  . TONSILLECTOMY    . TOTAL KNEE ARTHROPLASTY  08/11/2011   Procedure: TOTAL KNEE ARTHROPLASTY;  Surgeon: [name], MD;  Location: [facility];  Service: Orthopedics;  Laterality: Left;  . TOTAL KNEE ARTHROPLASTY Left 2010    There were no vitals filed for this visit.  Subjective Assessment - 04/20/19 0955    Subjective  Patient states she has been raking her yard, has been performing pulling, lifting, and bending motion. Patient states she is feeling better overall.    Pertinent History  LBP. Pain onset 4-5 years ago insidiously. Patient has received multiple epidurals for managing the pain with good results. Patient  reports MRI positive for pinched nerve L3-4 and L5-S1. Attended physical therapy 9 visits with some improvement. Agg: flexion activities, sit <> stand, transfers. Ease: moving out of agg position; moving slowly and carefully. Reports HTN, Hyperlipidemia, Cancer (melanoma dx 1970), 2006 lumbar decompression for nerve pain    Limitations  Lifting;House hold activities    How long can you sit comfortably?  indefinitely but pain during transfer    How long can you stand comfortably?  indefinitely    How long can you walk comfortably?  indefinitely    Diagnostic tests  MRI positive as described above    Patient Stated Goals  Improve core strength; reduce pain    Currently in Pain?  No/denies    Pain Onset  More than a month ago       TREATMENT Therapeutic Exercise Straight arm push downs at [place] - x 15 15# Scapular  retraction rows - x 20 10# Squat Pulls at [facility]  -- x 15 15# Leg Press machine knee extension - x 15 35# Low row at [place] - x 15 10# Hip abduction, extension and squats with TRX straps - x 10 for each movement Performed each movement to contribute to gym program and HEP after discharge from therapy    PT Education - 04/20/19 0958    Education Details  form/technique with exercise;    Person(s) Educated  Patient    Methods  Demonstration;Explanation    Comprehension  Verbalized understanding;Returned demonstration       PT Short Term Goals - 01/11/19 1756      PT SHORT TERM GOAL #1   Title  Patient will demonstrate adherence to HEP 3x/wk as adjunct to clinical therapy to speed recovery and reduce total number of visits.    Baseline  HEP given    Time  2    Period  Weeks    Status  Achieved    Target Date  12/07/18        PT Long Term Goals - 04/20/19 1034      PT LONG TERM GOAL #1   Title  Patient will reduce score on MODI by 10% to achieve MCID and demonstrate reduced disability.    Baseline  26%; 01/11/2019 38%; 02/09/2019: 22%; 03/14/2019: 20%; 04/20/2019: 14%    Time  6    Period  Weeks    Status  Achieved      PT LONG TERM GOAL #2   Title  Patient will report no increased pain with meal prep activities to indicate improved pain response with repeated flexion.    Baseline  9-10/10; 01/11/2019 9/10; 02/10/2019: 3/10; 03/14/2019: 4/10; 04/20/2019: 2/10    Time  6    Period  Weeks    Status  On-going      PT LONG TERM GOAL #3   Title  Patient will report ability to perform 5 sit <> stand transfers without increase in pain and with good technique to demonstrate reduced pain with transfers.    Baseline  Hip adduction/UE assist with sit <> stand; reports increase in pain; 01/11/2019 able to sit <> stand without UE or pain increase but hip adduction moment present; 02/09/2019: able to perform without pain.    Time  6    Period  Weeks    Status  Achieved            Plan -  04/20/19 1131    Clinical Impression Statement  Patient is making progress towards long term goals with overall  less pain, and a signiicant improvement with with her MODI. Although patient is making improvements, she continues to have increased pain with prolonged standing (such as when meal prepping), she has made significant improvements and wants to ensure her pain continues to decrease. Patient would like to review exercises in which can be performed in a gym setting to not aggravate her lumbar spine while continuing to make improvements. POC: add exercises to HEP and discharge to home and gym program.    Personal Factors and Comorbidities  Comorbidity 1;Comorbidity 2;Comorbidity 3+;Age    Comorbidities  HTN, Hyperlipidemia, Cancer    Examination-Activity Limitations  Carry;Stand;Stairs;Locomotion Level;Bend;Lift;Squat;Sit    Examination-Participation Restrictions  Cleaning;Laundry;Meal Prep    Stability/Clinical Decision Making  Stable/Uncomplicated    Rehab Potential  Good    PT Frequency  2x / week    PT Duration  6 weeks    PT Treatment/Interventions  ADLs/Self Care Home Management;Moist Heat;Functional mobility training;Therapeutic activities;Therapeutic exercise;Patient/family education;Neuromuscular re-education;Balance training;Stair training;Manual techniques;Passive range of motion;Joint Manipulations;Taping;Dry needling;Electrical Stimulation;Cryotherapy    PT Next Visit Plan  advance HEP. progress thoracic mobilization    PT Home Exercise Plan  Standing extension, thoracic SNAG    Consulted and Agree with Plan of Care  Patient       Patient will benefit from skilled therapeutic intervention in order to improve the following deficits and impairments:  Decreased balance, Decreased endurance, Decreased mobility, Difficulty walking, Hypomobility, Increased muscle spasms, Impaired sensation, Decreased range of motion, Improper body mechanics, Pain, Impaired flexibility, Decreased  strength, Decreased activity tolerance, Abnormal gait  Visit Diagnosis: Pain in right hip  Chronic right-sided low back pain without sciatica  Muscle weakness (generalized)     Problem List Patient Active Problem List   Diagnosis Date Noted  . Overactive bladder 12/23/2018  . PAT (paroxysmal atrial tachycardia) ([place]) 09/30/2018  . Adult idiopathic generalized osteoporosis 06/04/2018  . OSA on CPAP 03/29/2018  . Recurrent UTI 12/27/2017  . Renal cyst 12/27/2017  . Rotator cuff tear, right 10/02/2017  . Complete rotator cuff tear 10/02/2017  . Tubular adenoma 05/27/2017  . Spondylolisthesis 05/13/2017  . Spinal stenosis of lumbar region 05/13/2017  . Neuritis of saphenous nerve 05/13/2017  . Lumbar radiculopathy 05/13/2017  . Essential hypertension 07/31/2016  . Renal artery stenosis ([place]) 07/31/2016  . DJD (degenerative joint disease) 07/31/2016  . Low serum vitamin D 07/09/2015  . Distal radius fracture, right 06/30/2015  . Hyperlipidemia, mixed 04/10/2015  . History of malignant melanoma 02/13/2014  . Pulmonary emboli ([place]) 06/17/2013  . Pernicious anemia 06/17/2013  . Lumbar disc disease 06/17/2013  . Cervical disc disease 06/17/2013  . Basal cell carcinoma of right cheek 04/28/2013  . S/P left TKA 08/11/2011    Blythe Stanford, PT DPT 04/20/2019, 11:34 AM  [facility] [address] [place], [address] Phone: [phone]   Fax:  [phone]  Name: TANSEY KASTER MRN: [id number] Date of Birth: 01/26/1934

## 2019-04-27 ENCOUNTER — Ambulatory Visit: Payer: Medicare Other

## 2019-05-04 ENCOUNTER — Ambulatory Visit: Payer: Medicare Other

## 2019-07-05 ENCOUNTER — Other Ambulatory Visit: Payer: Self-pay | Admitting: Specialist

## 2019-07-05 DIAGNOSIS — M545 Low back pain, unspecified: Secondary | ICD-10-CM

## 2019-07-05 DIAGNOSIS — G8929 Other chronic pain: Secondary | ICD-10-CM

## 2019-07-08 ENCOUNTER — Other Ambulatory Visit: Payer: Self-pay

## 2019-07-08 ENCOUNTER — Other Ambulatory Visit: Payer: Self-pay | Admitting: Specialist

## 2019-07-08 ENCOUNTER — Ambulatory Visit
Admission: RE | Admit: 2019-07-08 | Discharge: 2019-07-08 | Disposition: A | Discharge status: Home / Self Care | Payer: Medicare Other | Source: Ambulatory Visit | Attending: Specialist | Admitting: Specialist

## 2019-07-08 DIAGNOSIS — G8929 Other chronic pain: Secondary | ICD-10-CM

## 2019-07-08 DIAGNOSIS — M545 Low back pain, unspecified: Secondary | ICD-10-CM

## 2019-07-08 MED ORDER — IOPAMIDOL (ISOVUE-M 200) INJECTION 41%
1.0000 mL | Freq: Once | INTRAMUSCULAR | Status: AC
  Administered 2019-07-08: 1 mL via EPIDURAL

## 2019-07-08 MED ORDER — METHYLPREDNISOLONE ACETATE 40 MG/ML INJ SUSP (RADIOLOG
120.0000 mg | Freq: Once | INTRAMUSCULAR | Status: AC
  Administered 2019-07-08: 120 mg via EPIDURAL

## 2019-08-23 ENCOUNTER — Other Ambulatory Visit: Payer: Self-pay | Admitting: Specialist

## 2019-08-23 DIAGNOSIS — M48061 Spinal stenosis, lumbar region without neurogenic claudication: Secondary | ICD-10-CM

## 2019-09-13 ENCOUNTER — Ambulatory Visit
Admission: RE | Admit: 2019-09-13 | Discharge: 2019-09-13 | Disposition: A | Discharge status: Home / Self Care | Payer: Medicare Other | Source: Ambulatory Visit | Attending: Specialist | Admitting: Specialist

## 2019-09-13 ENCOUNTER — Other Ambulatory Visit: Payer: Self-pay

## 2019-09-13 DIAGNOSIS — M48061 Spinal stenosis, lumbar region without neurogenic claudication: Secondary | ICD-10-CM

## 2019-09-13 MED ORDER — IOPAMIDOL (ISOVUE-M 200) INJECTION 41%
1.0000 mL | Freq: Once | INTRAMUSCULAR | Status: AC
  Administered 2019-09-13: 1 mL via EPIDURAL

## 2019-09-13 MED ORDER — METHYLPREDNISOLONE ACETATE 40 MG/ML INJ SUSP (RADIOLOG
120.0000 mg | Freq: Once | INTRAMUSCULAR | Status: AC
  Administered 2019-09-13: 120 mg via EPIDURAL

## 2019-09-13 NOTE — Discharge Instructions (Signed)

## 2019-10-12 ENCOUNTER — Other Ambulatory Visit: Payer: Self-pay | Admitting: Internal Medicine

## 2019-10-12 DIAGNOSIS — Z1231 Encounter for screening mammogram for malignant neoplasm of breast: Secondary | ICD-10-CM

## 2019-11-01 ENCOUNTER — Other Ambulatory Visit: Payer: Self-pay

## 2019-11-01 ENCOUNTER — Ambulatory Visit
Admission: RE | Admit: 2019-11-01 | Discharge: 2019-11-01 | Disposition: A | Discharge status: Home / Self Care | Payer: Medicare Other | Source: Ambulatory Visit | Attending: Internal Medicine | Admitting: Internal Medicine

## 2019-11-01 DIAGNOSIS — Z1231 Encounter for screening mammogram for malignant neoplasm of breast: Secondary | ICD-10-CM | POA: Insufficient documentation

## 2019-12-26 ENCOUNTER — Other Ambulatory Visit: Payer: Self-pay

## 2019-12-26 ENCOUNTER — Encounter: Payer: Self-pay | Admitting: Urology

## 2019-12-26 ENCOUNTER — Ambulatory Visit (INDEPENDENT_AMBULATORY_CARE_PROVIDER_SITE_OTHER): Payer: Medicare Other | Admitting: Urology

## 2019-12-26 VITALS — BP 121/76 | HR 58 | Ht 65.0 in | Wt 155.0 lb

## 2019-12-26 DIAGNOSIS — N3281 Overactive bladder: Secondary | ICD-10-CM

## 2019-12-26 DIAGNOSIS — N390 Urinary tract infection, site not specified: Secondary | ICD-10-CM | POA: Diagnosis not present

## 2019-12-26 NOTE — Progress Notes (Signed)
12/26/2019 3:44 PM   [place] 21-Jun-1934 [id number]  Referring provider: Rusty Aus, MD [facility] [place],  [place] [address]  Chief Complaint  Patient presents with  . Recurrent UTI    HPI: 85 y.o. female presents for annual follow-up of recurrent UTI.   Doing well since last years visit  Was given a trial of Myrbetriq for storage related voiding symptoms though she states she does not remember getting samples  Does have frequency, urgency and urge incontinence and is interested in trying medication  No UTI treatment the past year on review of Epic and Care Everywhere  Denies any other visits for UTI symptoms/treatment  PMH: Past Medical History:  Diagnosis Date  . Anemia   . Arthritis   . Cancer ([place])    hx of melanoma in 1971, basal cell   . Cervical disc disease   . Chronic kidney disease    current uti 08/05/11   . Depression   . DVT (deep venous thrombosis) ([place]) 01/2015   was Eliquis until 06/13/15-   . Dysrhythmia    hx of heart racing - in past, "none  in years".  had a stress test > 7 years  . History of recurrent UTIs    "bladder infection"  Had a PICC line in past  . Hyperlipidemia   . Hypertension   . Melanoma ([place])   . Nephrolithiasis   . Pulmonary emboli ([facility])    bilateral 8/12 hospitalized   . Vitamin D deficiency     Surgical History: Past Surgical History:  Procedure Laterality Date  . ABDOMINAL HYSTERECTOMY     1981  . BACK SURGERY    . BREAST EXCISIONAL BIOPSY Right 1979   Negative  . COLONOSCOPY W/ POLYPECTOMY    . COLONOSCOPY WITH PROPOFOL N/A 04/29/2017   Procedure: COLONOSCOPY WITH PROPOFOL;  Surgeon: [name], MD;  Location: [facility] ENDOSCOPY;  Service: Endoscopy;  Laterality: N/A;  . JOINT REPLACEMENT     bilateral knee  . left wrist      surgery x 2   . OPEN REDUCTION INTERNAL FIXATION (ORIF) DISTAL RADIAL FRACTURE Right 06/30/2015   Procedure: OPEN REDUCTION  INTERNAL FIXATION (ORIF) RIGHT DISTAL RADIUS FRACTURE ;  Surgeon: [name], MD;  Location: [facility];  Service: Orthopedics;  Laterality: Right;  . OTHER SURGICAL HISTORY Right    leg - melonoma  . SHOULDER ARTHROSCOPY WITH OPEN ROTATOR CUFF REPAIR Right 10/02/2017   Procedure: Right shoulder mini open rotator cuff repair;  Surgeon: Susa Day, MD;  Location: [facility];  Service: Orthopedics;  Laterality: Right;  90 mins  . TONSILLECTOMY    . TOTAL KNEE ARTHROPLASTY  08/11/2011   Procedure: TOTAL KNEE ARTHROPLASTY;  Surgeon: [name], MD;  Location: [facility];  Service: Orthopedics;  Laterality: Left;  . TOTAL KNEE ARTHROPLASTY Left 2010    Home Medications:  Allergies as of 12/26/2019      Reactions   Penicillins Anaphylaxis, Other (See Comments)   Has patient had a PCN reaction causing immediate rash, facial/tongue/throat swelling, SOB or lightheadedness with hypotension: yes Has patient had a PCN reaction causing severe rash involving mucus membranes or skin necrosis no Has patient had a PCN reaction that required hospitalization no Has patient had a PCN reaction occurring within the last 10 years: no If all of the above answers are "NO", then may proceed with Cephalosporin use.   Latex Rash, Other (See Comments)   Blisters  Sulfa Antibiotics Itching, Rash, Other (See Comments)   "pass out" Syncope Is able to take some sulfa abx per pt. But allergic to most   Caffeine Swelling   Celebrex [celecoxib] Rash   Clindamycin/lincomycin Rash   Diclofenac Rash   Elastic Bandages & [zinc] Rash   Allergic to Elastic in underwear   Macrobid [nitrofurantoin Monohyd Macro] Rash   Monosodium Glutamate Other (See Comments)   Migraine headache   Tape Itching, Swelling, Other (See Comments)   Redness, burning      Medication List       Accurate as of December 26, 2019  3:44 PM. If you have any questions, ask your nurse or doctor.        STOP taking these medications   mirabegron  ER 25 MG Tb24 tablet Commonly known as: MYRBETRIQ Stopped by: Abbie Sons, MD     TAKE these medications   cyanocobalamin 1000 MCG/ML injection Commonly known as: (VITAMIN B-12) Inject 1,000 mcg into the muscle every 30 (thirty) days.   diclofenac sodium 1 % Gel Commonly known as: VOLTAREN Apply 2 g topically 2 (two) times daily as needed (For pain.).   fluocinonide cream 0.05 % Commonly known as: LIDEX fluocinonide 0.05 % topical cream  APPLY TWICE DAILY TO RASH UNTIL CLEAR   Lyrica 50 MG capsule Generic drug: pregabalin Take 50 mg by mouth 2 (two) times daily.   meclizine 25 MG tablet Commonly known as: ANTIVERT Take 25 mg by mouth every 8 (eight) hours as needed.   metoprolol succinate 50 MG 24 hr tablet Commonly known as: TOPROL-XL Take 50 mg by mouth 2 (two) times daily. Take with or immediately following a meal.   traMADol 50 MG tablet Commonly known as: ULTRAM tramadol 50 mg tablet   venlafaxine XR 75 MG 24 hr capsule Commonly known as: EFFEXOR-XR Take 75 mg by mouth daily.   vitamin C 1000 MG tablet Take 1,000 mg by mouth daily.   Vitamin D3 125 MCG (5000 UT) Tabs Take 5,000 Units by mouth daily.   zolpidem 5 MG tablet Commonly known as: AMBIEN Take 2.5-5 mg by mouth at bedtime.       Allergies:  Allergies  Allergen Reactions  . Penicillins Anaphylaxis and Other (See Comments)    Has patient had a PCN reaction causing immediate rash, facial/tongue/throat swelling, SOB or lightheadedness with hypotension: yes Has patient had a PCN reaction causing severe rash involving mucus membranes or skin necrosis no Has patient had a PCN reaction that required hospitalization no Has patient had a PCN reaction occurring within the last 10 years: no If all of the above answers are "NO", then may proceed with Cephalosporin use.   . Latex Rash and Other (See Comments)    Blisters   . Sulfa Antibiotics Itching, Rash and Other (See Comments)    "pass  out" Syncope Is able to take some sulfa abx per pt. But allergic to most    . Caffeine Swelling  . Celebrex [Celecoxib] Rash  . Clindamycin/Lincomycin Rash  . Diclofenac Rash  . Elastic Bandages & [Zinc] Rash    Allergic to Elastic in underwear  . Macrobid [Nitrofurantoin Monohyd Macro] Rash  . Monosodium Glutamate Other (See Comments)    Migraine headache  . Tape Itching, Swelling and Other (See Comments)    Redness, burning    Family History: Family History  Problem Relation Age of Onset  . Breast cancer Cousin     Social History:  reports that she  has never smoked. She has never used smokeless tobacco. She reports current alcohol use of about 1.0 standard drink of alcohol per week. She reports that she does not use drugs.   Physical Exam: BP 121/76   Pulse (!) 58   Ht 5\' 5"  (1.651 m)   Wt 155 lb (70.3 kg)   BMI 25.79 kg/m   Constitutional:  Alert, No acute distress. HEENT: [place] AT, moist mucus membranes.  Trachea midline, no masses. Cardiovascular: No clubbing, cyanosis, or edema. Respiratory: Normal respiratory effort, no increased work of breathing. Skin: No rashes, bruises or suspicious lesions. Neurologic: Grossly intact, no focal deficits, moving all 4 extremities. Psychiatric: Normal mood and affect.  Laboratory Data:  Urinalysis UA pending  Assessment & Plan:    1. Recurrent UTI  Remains asymptomatic and no treatment treatment in 2 years  Does have chronic asymptomatic bacteriuria, UA today pending  2.  Overactive bladder  Given samples Myrbetriq 25 mg and instructed to call back in 1 month regarding efficacy    Abbie Sons, MD  [facility] [address] [place], [place] [address] [phone]

## 2019-12-26 NOTE — Addendum Note (Signed)
Addended by: Chrystie Nose on: 12/26/2019 03:56 PM   Modules accepted: Orders

## 2019-12-27 LAB — URINALYSIS, COMPLETE
Bilirubin, UA: NEGATIVE
Glucose, UA: NEGATIVE
Ketones, UA: NEGATIVE
Nitrite, UA: POSITIVE — AB
Protein,UA: NEGATIVE
RBC, UA: NEGATIVE
Specific Gravity, UA: 1.015 (ref 1.005–1.030)
Urobilinogen, Ur: 0.2 mg/dL (ref 0.2–1.0)
pH, UA: 6.5 (ref 5.0–7.5)

## 2019-12-27 LAB — MICROSCOPIC EXAMINATION: WBC, UA: >30 /hpf — AB (ref 0–5)

## 2019-12-30 LAB — CULTURE, URINE COMPREHENSIVE

## 2020-01-12 ENCOUNTER — Other Ambulatory Visit: Payer: Self-pay | Admitting: Specialist

## 2020-01-12 DIAGNOSIS — M48061 Spinal stenosis, lumbar region without neurogenic claudication: Secondary | ICD-10-CM

## 2020-01-20 ENCOUNTER — Other Ambulatory Visit: Payer: Self-pay

## 2020-01-20 ENCOUNTER — Ambulatory Visit
Admission: RE | Admit: 2020-01-20 | Discharge: 2020-01-20 | Disposition: A | Discharge status: Home / Self Care | Payer: Medicare Other | Source: Ambulatory Visit | Attending: Specialist | Admitting: Specialist

## 2020-01-20 DIAGNOSIS — M48061 Spinal stenosis, lumbar region without neurogenic claudication: Secondary | ICD-10-CM

## 2020-01-20 MED ORDER — IOPAMIDOL (ISOVUE-M 200) INJECTION 41%
1.0000 mL | Freq: Once | INTRAMUSCULAR | Status: AC
  Administered 2020-01-20: 1 mL via EPIDURAL

## 2020-01-20 MED ORDER — METHYLPREDNISOLONE ACETATE 40 MG/ML INJ SUSP (RADIOLOG
120.0000 mg | Freq: Once | INTRAMUSCULAR | Status: AC
  Administered 2020-01-20: 120 mg via EPIDURAL

## 2020-01-20 NOTE — Discharge Instructions (Signed)

## 2020-02-13 ENCOUNTER — Other Ambulatory Visit: Payer: Self-pay | Admitting: *Deleted

## 2020-02-13 ENCOUNTER — Telehealth: Payer: Self-pay | Admitting: Urology

## 2020-02-13 MED ORDER — MIRABEGRON ER 25 MG PO TB24: 25.0000 mg | ORAL_TABLET | Freq: Every day | ORAL | 11 refills | Status: AC

## 2020-02-13 NOTE — Telephone Encounter (Signed)
Medication sent.

## 2020-02-13 NOTE — Telephone Encounter (Signed)
Pt took samples of Myrbetriq 25 mg and would like some called on to [facility] on [address].

## 2020-02-16 NOTE — Telephone Encounter (Signed)
Patient called the office this afternoon.  She went to the pharmacy to pick up the [facility] prescription and it was $480.  Patient states that she cannot afford to pay that much money for a prescription.  She is asking if there are any other options that she can try.  Please advise.

## 2020-02-17 NOTE — Telephone Encounter (Signed)
It is going to depend on what OAB medications her insurance plan covers as they are vary

## 2020-02-17 NOTE — Telephone Encounter (Signed)
Called patient and advised her it will depend on what OAB medications her insurance plan covers as they are vary. Patient states she is going to talk with her insurance and get back with [place].

## 2020-03-26 ENCOUNTER — Ambulatory Visit: Payer: Medicare Other

## 2020-03-29 ENCOUNTER — Ambulatory Visit: Payer: Medicare Other | Attending: Specialist

## 2020-03-29 ENCOUNTER — Other Ambulatory Visit: Payer: Self-pay

## 2020-03-29 DIAGNOSIS — M545 Low back pain, unspecified: Secondary | ICD-10-CM | POA: Diagnosis present

## 2020-03-29 DIAGNOSIS — G8929 Other chronic pain: Secondary | ICD-10-CM | POA: Insufficient documentation

## 2020-03-29 DIAGNOSIS — M25552 Pain in left hip: Secondary | ICD-10-CM | POA: Insufficient documentation

## 2020-03-29 DIAGNOSIS — M25551 Pain in right hip: Secondary | ICD-10-CM | POA: Diagnosis present

## 2020-03-29 DIAGNOSIS — M6281 Muscle weakness (generalized): Secondary | ICD-10-CM | POA: Diagnosis present

## 2020-03-29 NOTE — Therapy (Signed)
[facility] [address], [place], [address] Phone: [phone]   Fax:  [phone]  Physical Therapy Evaluation  Patient Details  Name: Anita Bailey MRN: [id number] Date of Birth: 07/25/1934 Referring Provider (PT): Tonita Cong MD   Encounter Date: 03/29/2020   PT End of Session - 03/29/20 1636    Visit Number 1    Number of Visits 30    Date for PT Re-Evaluation 06/23/19    Authorization Type 1/10    PT Start Time 1115    PT Stop Time 1200    PT Time Calculation (min) 45 min    Equipment Utilized During Treatment Gait belt    Activity Tolerance Patient tolerated treatment well;No increased pain    Behavior During Therapy WFL for tasks assessed/performed           Past Medical History:  Diagnosis Date  . Anemia   . Arthritis   . Cancer ([place])    hx of melanoma in 1971, basal cell   . Cervical disc disease   . Chronic kidney disease    current uti 08/05/11   . Depression   . DVT (deep venous thrombosis) ([place]) 01/2015   was Eliquis until 06/13/15-   . Dysrhythmia    hx of heart racing - in past, "none  in years".  had a stress test > 7 years  . History of recurrent UTIs    "bladder infection"  Had a PICC line in past  . Hyperlipidemia   . Hypertension   . Melanoma ([place])   . Nephrolithiasis   . Pulmonary emboli ([facility])    bilateral 8/12 hospitalized   . Vitamin D deficiency     Past Surgical History:  Procedure Laterality Date  . ABDOMINAL HYSTERECTOMY     1981  . BACK SURGERY    . BREAST EXCISIONAL BIOPSY Right 1979   Negative  . COLONOSCOPY W/ POLYPECTOMY    . COLONOSCOPY WITH PROPOFOL N/A 04/29/2017   Procedure: COLONOSCOPY WITH PROPOFOL;  Surgeon: [name], MD;  Location: [facility];  Service: Endoscopy;  Laterality: N/A;  . JOINT REPLACEMENT     bilateral knee  . left wrist      surgery x 2   . OPEN REDUCTION INTERNAL FIXATION (ORIF) DISTAL RADIAL FRACTURE Right 06/30/2015    Procedure: OPEN REDUCTION INTERNAL FIXATION (ORIF) RIGHT DISTAL RADIUS FRACTURE ;  Surgeon: [name], MD;  Location: [facility];  Service: Orthopedics;  Laterality: Right;  . OTHER SURGICAL HISTORY Right    leg - melonoma  . SHOULDER ARTHROSCOPY WITH OPEN ROTATOR CUFF REPAIR Right 10/02/2017   Procedure: Right shoulder mini open rotator cuff repair;  Surgeon: Susa Day, MD;  Location: [facility];  Service: Orthopedics;  Laterality: Right;  90 mins  . TONSILLECTOMY    . TOTAL KNEE ARTHROPLASTY  08/11/2011   Procedure: TOTAL KNEE ARTHROPLASTY;  Surgeon: [name], MD;  Location: [facility];  Service: Orthopedics;  Laterality: Left;  . TOTAL KNEE ARTHROPLASTY Left 2010    There were no vitals filed for this visit.    Subjective Assessment - 03/29/20 1122    Subjective Patient states increased low back pain most notably onto the L side most notably with returning from a bent over posture. Patient reports she has been having increased difficulty with performing forward bending, squatting, transferring from low siting surfaces and lifting. Patient reports decreased pain when she is not actively moving her affected aspect of  her lumbar spine. Increased pain began to elevate the previous 4 months from idiopathetic onset. Patient states she would like to help address her pain in therapy and work improve ability to lift items from the floor.    Pertinent History LBP. Pain onset 4-5 years ago insidiously. Patient has received multiple epidurals for managing the pain with good results. Patient reports MRI positive for pinched nerve L3-4 and L5-S1. Attended physical therapy 9 visits with some improvement. Agg: flexion activities, sit <> stand, transfers. Ease: moving out of agg position; moving slowly and carefully. Reports HTN, Hyperlipidemia, Cancer (melanoma dx 1970), 2006 lumbar decompression for nerve pain    Limitations Lifting;House hold activities    How long can you sit comfortably? indefinitely  but pain during transfer    How long can you stand comfortably? indefinitely    How long can you walk comfortably? indefinitely    Diagnostic tests MRI positive as described above    Patient Stated Goals Improve core strength; reduce pain    Currently in Pain? Yes    Pain Score 1    worst 8/10   Pain Location Back    Pain Orientation Mid;Left    Pain Descriptors / Indicators Aching    Pain Type Chronic pain    Pain Onset More than a month ago    Pain Frequency Intermittent              [facility] PT Assessment - 03/29/20 1746      Assessment   Medical Diagnosis LBP    Referring Provider (PT) Beane MD    Onset Date/Surgical Date 01/13/14    Hand Dominance Right    Next MD Visit unknown    Prior Therapy Yes for same issue      Restrictions   Weight Bearing Restrictions No      Balance Screen   Has the patient fallen in the past 6 months No    Has the patient had a decrease in activity level because of a fear of falling?  Yes    Is the patient reluctant to leave their home because of a fear of falling?  No      Home Environment   Living Environment Private residence    Living Arrangements Alone    Available Help at Discharge Family    Type of Pound to enter    Entrance Stairs-Number of Steps 14      Prior Function   Level of Independence Independent    Vocation Retired    Biomedical scientist N/A    Leisure Shopping, cards, light yard work      Cognition   Overall Cognitive Status Within [employer] for tasks assessed      Armed forces technical officer   Comments Increased pain unable to perform through deeper range of motion      Sit to Stand   Comments Unable to perform without UE support;      ROM / Strength   AROM / PROM / Strength AROM;Strength      AROM   AROM Assessment Site Lumbar    Right Hip Extension 10    Right Hip Flexion 100    Right Hip External Rotation  25    Right Hip Internal  Rotation  35    Right Hip ABduction 30    Left Hip Extension 10    Left Hip Flexion 100  Left Hip External Rotation  25    Left Hip Internal Rotation  35    Left Hip ABduction 35    Left Hip ADduction 30    Right/Left Knee Left;Right    Right Knee Extension 0    Right Knee Flexion 110    Left Knee Extension 0    Left Knee Flexion 110    Lumbar Flexion WNL - difficulty on return to erect posture increase in pain    Lumbar Extension 25% limited    Lumbar - Right Side Bend 25% limited - pulling and pain on affected side    Lumbar - Left Side Bend 25% limited - pulling and pain on affected side    Lumbar - Right Rotation no increase in pain    Lumbar - Left Rotation no increase in pain - WNL      Strength   Right Hip Flexion 4+/5    Right Hip Extension 4/5    Right Hip External Rotation  3+/5    Right Hip Internal Rotation 4-/5    Right Hip ABduction 4/5    Left Hip Flexion 4+/5    Left Hip Extension 3+/5    Left Hip External Rotation 4-/5    Left Hip Internal Rotation 4/5    Left Hip ABduction 4-/5    Right/Left Knee Right;Left    Right Knee Flexion 4/5    Right Knee Extension 5/5    Left Knee Flexion 5/5    Left Knee Extension 5/5      Palpation   Spinal mobility Hypomobility:    Palpation comment TTP L4-L5 CPA, R UPA along same levels            Objective measurements completed on examination: See above findings.    TREATMENT Therapeutic Exercise Hip abduction in standing -- x 10  Bridges in hooklying -- x 10  Pelvic tilts -- x 10  LTRs -- hooklying -- x 10   Perfomed exercises to address strength           PT Education - 03/29/20 1635    Education Details form/technique with exercise; HEP: LTR, Bridges, hip abduction in standing, pelvic tilts; POC    Person(s) Educated Patient    Methods Explanation;Demonstration;Handout    Comprehension Verbalized understanding;Returned demonstration            PT Short Term Goals - 03/29/20 1641       PT SHORT TERM GOAL #1   Title Patient will demonstrate adherence to HEP 3x/wk as adjunct to clinical therapy to speed recovery and reduce total number of visits.    Baseline HEP given    Time 2    Period Weeks    Status New             PT Long Term Goals - 03/29/20 1641      PT LONG TERM GOAL #1   Title Patient will improve score on FOTO score by a significant value to indicate significant improvement in Lumbar functioning    Time 6    Period Weeks    Status New      PT LONG TERM GOAL #2   Title Patient will report no increased pain with meal prep activities to indicate improved pain response with repeated flexion.    Baseline 9-10/10;    Time 6    Period Weeks    Status New      PT LONG TERM GOAL #3   Title Patient will be able to  perform a sit to stand transfer without increase in pain to indicate improvement in pain and muscular guarding    Baseline increased pain (6/10) with performance    Time 6    Period Weeks    Status New                  Plan - 03/29/20 1637    Clinical Impression Statement Patient is a 86 yo right hand dominant female presenting with increased pain and spasms along the lumbar spine limiting ability to perform sit to stand transfers and bending. Patient demonstrates increased lumbar dysfunction with notable hip extension, lumbar extension and weakness along the posterior chain. In addition patinet has significant weakness along the superior aspect of the gluteal musculature and lumbar extensors. Patient will benefit from further skilled therapy to return to prior level of function.    Personal Factors and Comorbidities Comorbidity 1;Comorbidity 2;Comorbidity 3+;Age    Comorbidities HTN, Hyperlipidemia, Cancer    Examination-Activity Limitations Carry;Stand;Stairs;Locomotion Level;Bend;Lift;Squat;Sit    Examination-Participation Restrictions Cleaning;Laundry;Meal Prep    Stability/Clinical Decision Making Stable/Uncomplicated    Rehab  Potential Good    PT Frequency 2x / week    PT Duration 6 weeks    PT Treatment/Interventions ADLs/Self Care Home Management;Moist Heat;Functional mobility training;Therapeutic activities;Therapeutic exercise;Patient/family education;Neuromuscular re-education;Balance training;Stair training;Manual techniques;Passive range of motion;Joint Manipulations;Taping;Dry needling;Electrical Stimulation;Cryotherapy    PT Next Visit Plan advance HEP. progress thoracic mobilization    PT Home Exercise Plan See education section    Consulted and Agree with Plan of Care Patient           Patient will benefit from skilled therapeutic intervention in order to improve the following deficits and impairments:  Decreased balance,Decreased endurance,Decreased mobility,Difficulty walking,Hypomobility,Increased muscle spasms,Impaired sensation,Decreased range of motion,Improper body mechanics,Pain,Impaired flexibility,Decreased strength,Decreased activity tolerance,Abnormal gait  Visit Diagnosis: Pain in left hip  Muscle weakness (generalized)  Chronic left-sided low back pain without sciatica     Problem List Patient Active Problem List   Diagnosis Date Noted  . Overactive bladder 12/23/2018  . PAT (paroxysmal atrial tachycardia) ([place]) 09/30/2018  . Adult idiopathic generalized osteoporosis 06/04/2018  . OSA on CPAP 03/29/2018  . Recurrent UTI 12/27/2017  . Renal cyst 12/27/2017  . Rotator cuff tear, right 10/02/2017  . Complete rotator cuff tear 10/02/2017  . Tubular adenoma 05/27/2017  . Spondylolisthesis 05/13/2017  . Spinal stenosis of lumbar region 05/13/2017  . Neuritis of saphenous nerve 05/13/2017  . Lumbar radiculopathy 05/13/2017  . Essential hypertension 07/31/2016  . Renal artery stenosis ([place]) 07/31/2016  . DJD (degenerative joint disease) 07/31/2016  . Low serum vitamin D 07/09/2015  . Distal radius fracture, right 06/30/2015  . Hyperlipidemia, mixed 04/10/2015  . History of  malignant melanoma 02/13/2014  . Pulmonary emboli ([place]) 06/17/2013  . Pernicious anemia 06/17/2013  . Lumbar disc disease 06/17/2013  . Cervical disc disease 06/17/2013  . Basal cell carcinoma of right cheek 04/28/2013  . S/P left TKA 08/11/2011    Blythe Stanford, PT DPT 03/29/2020, 6:00 PM  [facility] [address] [place], [address] Phone: [phone]   Fax:  [phone]  Name: Anita Bailey MRN: [id number] Date of Birth: 10-14-1934

## 2020-04-03 ENCOUNTER — Ambulatory Visit: Payer: Medicare Other

## 2020-04-05 ENCOUNTER — Ambulatory Visit: Payer: Medicare Other

## 2020-04-05 ENCOUNTER — Other Ambulatory Visit: Payer: Self-pay

## 2020-04-05 DIAGNOSIS — M25552 Pain in left hip: Secondary | ICD-10-CM | POA: Diagnosis not present

## 2020-04-05 DIAGNOSIS — M545 Low back pain, unspecified: Secondary | ICD-10-CM

## 2020-04-05 DIAGNOSIS — G8929 Other chronic pain: Secondary | ICD-10-CM

## 2020-04-05 DIAGNOSIS — M6281 Muscle weakness (generalized): Secondary | ICD-10-CM

## 2020-04-05 NOTE — Therapy (Addendum)
[facility] [address], [place], [address] Phone: [phone]   Fax:  [phone]  Physical Therapy Treatment  Patient Details  Name: Anita Bailey MRN: [id number] Date of Birth: Dec 19, 1934 Referring Provider (PT): Tonita Cong MD   Encounter Date: 04/05/2020   PT End of Session - 04/05/20 1139    Visit Number 2    Number of Visits 30    Date for PT Re-Evaluation 06/23/19    Authorization Type 2/10    PT Start Time 1120    PT Stop Time 1200    PT Time Calculation (min) 40 min    Equipment Utilized During Treatment Gait belt    Activity Tolerance Patient tolerated treatment well;No increased pain    Behavior During Therapy WFL for tasks assessed/performed           Past Medical History:  Diagnosis Date  . Anemia   . Arthritis   . Cancer ([place])    hx of melanoma in 1971, basal cell   . Cervical disc disease   . Chronic kidney disease    current uti 08/05/11   . Depression   . DVT (deep venous thrombosis) ([place]) 01/2015   was Eliquis until 06/13/15-   . Dysrhythmia    hx of heart racing - in past, "none  in years".  had a stress test > 7 years  . History of recurrent UTIs    "bladder infection"  Had a PICC line in past  . Hyperlipidemia   . Hypertension   . Melanoma ([place])   . Nephrolithiasis   . Pulmonary emboli ([facility])    bilateral 8/12 hospitalized   . Vitamin D deficiency     Past Surgical History:  Procedure Laterality Date  . ABDOMINAL HYSTERECTOMY     1981  . BACK SURGERY    . BREAST EXCISIONAL BIOPSY Right 1979   Negative  . COLONOSCOPY W/ POLYPECTOMY    . COLONOSCOPY WITH PROPOFOL N/A 04/29/2017   Procedure: COLONOSCOPY WITH PROPOFOL;  Surgeon: [name], MD;  Location: [facility] END[facility]COPY;  Service: Endoscopy;  Laterality: N/A;  . JOINT REPLACEMENT     bilateral knee  . left wrist      surgery x 2   . OPEN REDUCTION INTERNAL FIXATION (ORIF) DISTAL RADIAL FRACTURE Right 06/30/2015    Procedure: OPEN REDUCTION INTERNAL FIXATION (ORIF) RIGHT DISTAL RADIUS FRACTURE ;  Surgeon: [name], MD;  Location: [facility];  Service: Orthopedics;  Laterality: Right;  . OTHER SURGICAL HISTORY Right    leg - melonoma  . SHOULDER ARTHROSCOPY WITH OPEN ROTATOR CUFF REPAIR Right 10/02/2017   Procedure: Right shoulder mini open rotator cuff repair;  Surgeon: Susa Day, MD;  [facility]: [facility];  Service: Orthopedics;  Laterality: Right;  90 mins  . TONSILLECTOMY    . TOTAL KNEE ARTHROPLASTY  08/11/2011   Procedure: TOTAL KNEE ARTHROPLASTY;  Surgeon: [name], MD;  Location: [facility];  Service: Orthopedics;  Laterality: Left;  . TOTAL KNEE ARTHROPLASTY Left 2010    There were no vitals filed for this visit.   Subjective Assessment - 04/05/20 1126    Subjective Patient states she has been working on exercises and can perform sit to stand much easier.    Pertinent History LBP. Pain onset 4-5 years ago insidiously. Patient has received multiple epidurals for managing the pain with good results. Patient reports MRI positive for pinched nerve L3-4 and L5-S1. Attended physical therapy 9 visits with  some improvement. Agg: flexion activities, sit <> stand, transfers. Ease: moving out of agg position; moving slowly and carefully. Reports HTN, Hyperlipidemia, Cancer (melanoma dx 1970), 2006 lumbar decompression for nerve pain    Limitations Lifting;House hold activities    How long can you sit comfortably? indefinitely but pain during transfer    How long can you stand comfortably? indefinitely    How long can you walk comfortably? indefinitely    Diagnostic tests MRI positive as described above    Patient Stated Goals Improve core strength; reduce pain    Currently in Pain? No/denies    Pain Onset More than a month ago            TREATMENT Therapeutic Exercise  Prayer stretch with physioball - x 10 ; lateral - x 10  Leaning forward with driving through heels - x 10  Pelvic tilt in  sitting - 2 x 10  Leaning forward to erect posture - x 10 Standing lumbar extension with hands on hips - -x 10  Hip hikes in standing off of a step - x 10   Performed exercises to address pain and spasms    PT Education - 04/05/20 1131    Education Details form/technique with exercise    Person(s) Educated Patient    Methods Explanation;Demonstration    Comprehension Verbalized understanding;Returned demonstration            PT Short Term Goals - 03/29/20 1641      PT SHORT TERM GOAL #1   Title Patient will demonstrate adherence to HEP 3x/wk as adjunct to clinical therapy to speed recovery and reduce total number of visits.    Baseline HEP given    Time 2    Period Weeks    Status New             PT Long Term Goals - 03/29/20 1641      PT LONG TERM GOAL #1   Title Patient will improve score on FOTO score by a significant value to indicate significant improvement in Lumbar functioning    Time 6    Period Weeks    Status New      PT LONG TERM GOAL #2   Title Patient will report no increased pain with meal prep activities to indicate improved pain response with repeated flexion.    Baseline 9-10/10;    Time 6    Period Weeks    Status New      PT LONG TERM GOAL #3   Title Patient will be able to perform a sit to stand transfer without increase in pain to indicate improvement in pain and muscular guarding    Baseline increased pain (6/10) with performance    Time 6    Period Weeks    Status New                 Plan - 04/05/20 1142    Clinical Impression Statement Patient demonstrates improvement with pain and spasms during today's session, was able to go through greater AROM compared to previous sessions. Although patient is improving, she continues to have increased pain along her QL and glute and will benefit from further skilled therapy to return to prior level of function.    Personal Factors and Comorbidities Comorbidity 1;Comorbidity 2;Comorbidity  3+;Age    Comorbidities HTN, Hyperlipidemia, Cancer    Examination-Activity Limitations Carry;Stand;Stairs;Locomotion Level;Bend;Lift;Squat;Sit    Examination-Participation Restrictions Cleaning;Laundry;Meal Prep    Stability/Clinical Decision Making Stable/Uncomplicated  Rehab Potential Good    PT Frequency 2x / week    PT Duration 6 weeks    PT Treatment/Interventions ADLs/Self Care Home Management;Moist Heat;Functional mobility training;Therapeutic activities;Therapeutic exercise;Patient/family education;Neuromuscular re-education;Balance training;Stair training;Manual techniques;Passive range of motion;Joint Manipulations;Taping;Dry needling;Electrical Stimulation;Cryotherapy    PT Next Visit Plan advance HEP. progress thoracic mobilization    PT Home Exercise Plan See education section    Consulted and Agree with Plan of Care Patient           Patient will benefit from skilled therapeutic intervention in order to improve the following deficits and impairments:  Decreased balance,Decreased endurance,Decreased mobility,Difficulty walking,Hypomobility,Increased muscle spasms,Impaired sensation,Decreased range of motion,Improper body mechanics,Pain,Impaired flexibility,Decreased strength,Decreased activity tolerance,Abnormal gait  Visit Diagnosis: Pain in left hip  Muscle weakness (generalized)  Chronic left-sided low back pain without sciatica     Problem List Patient Active Problem List   Diagnosis Date Noted  . Overactive bladder 12/23/2018  . PAT (paroxysmal atrial tachycardia) ([place]) 09/30/2018  . Adult idiopathic generalized osteoporosis 06/04/2018  . OSA on CPAP 03/29/2018  . Recurrent UTI 12/27/2017  . Renal cyst 12/27/2017  . Rotator cuff tear, right 10/02/2017  . Complete rotator cuff tear 10/02/2017  . Tubular adenoma 05/27/2017  . Spondylolisthesis 05/13/2017  . Spinal stenosis of lumbar region 05/13/2017  . Neuritis of saphenous nerve 05/13/2017  . Lumbar  radiculopathy 05/13/2017  . Essential hypertension 07/31/2016  . Renal artery stenosis ([place]) 07/31/2016  . DJD (degenerative joint disease) 07/31/2016  . Low serum vitamin D 07/09/2015  . Distal radius fracture, right 06/30/2015  . Hyperlipidemia, mixed 04/10/2015  . History of malignant melanoma 02/13/2014  . Pulmonary emboli ([place]) 06/17/2013  . Pernicious anemia 06/17/2013  . Lumbar disc disease 06/17/2013  . Cervical disc disease 06/17/2013  . Basal cell carcinoma of right cheek 04/28/2013  . S/P left TKA 08/11/2011    Blythe Stanford, PT DPT 04/05/2020, 2:33 PM  [facility] [address], [place], [address] Phone: [phone]   Fax:  [phone]  Name: Anita Bailey MRN: [id number] Date of Birth: May 29, 1934

## 2020-04-10 ENCOUNTER — Other Ambulatory Visit: Payer: Self-pay

## 2020-04-10 ENCOUNTER — Ambulatory Visit: Payer: Medicare Other

## 2020-04-10 DIAGNOSIS — M25552 Pain in left hip: Secondary | ICD-10-CM

## 2020-04-10 DIAGNOSIS — M545 Low back pain, unspecified: Secondary | ICD-10-CM

## 2020-04-10 DIAGNOSIS — M25551 Pain in right hip: Secondary | ICD-10-CM

## 2020-04-10 DIAGNOSIS — M6281 Muscle weakness (generalized): Secondary | ICD-10-CM

## 2020-04-10 DIAGNOSIS — G8929 Other chronic pain: Secondary | ICD-10-CM

## 2020-04-10 NOTE — Therapy (Signed)
[facility] [address] [place], [address] Phone: [phone]   Fax:  [phone]  Physical Therapy Treatment  Patient Details  Name: Anita Bailey MRN: [id number] Date of Birth: 1934/04/30 Referring Provider (PT): Tonita Cong MD   Encounter Date: 04/10/2020   PT End of Session - 04/10/20 1350    Visit Number 3    Number of Visits 30    Date for PT Re-Evaluation 06/23/19    Authorization Type 2/10    PT Start Time 1115    PT Stop Time 1200    PT Time Calculation (min) 45 min    Equipment Utilized During Treatment Gait belt    Activity Tolerance Patient tolerated treatment well;No increased pain    Behavior During Therapy WFL for tasks assessed/performed           Past Medical History:  Diagnosis Date  . Anemia   . Arthritis   . Cancer ([place])    hx of melanoma in 1971, basal cell   . Cervical disc disease   . Chronic kidney disease    current uti 08/05/11   . Depression   . DVT (deep venous thrombosis) ([place]) 01/2015   was Eliquis until 06/13/15-   . Dysrhythmia    hx of heart racing - in past, "none  in years".  had a stress test > 7 years  . History of recurrent UTIs    "bladder infection"  Had a PICC line in past  . Hyperlipidemia   . Hypertension   . Melanoma ([place])   . Nephrolithiasis   . Pulmonary emboli ([facility])    bilateral 8/12 hospitalized   . Vitamin D deficiency     Past Surgical History:  Procedure Laterality Date  . ABDOMINAL HYSTERECTOMY     1981  . BACK SURGERY    . BREAST EXCISIONAL BIOPSY Right 1979   Negative  . COLONOSCOPY W/ POLYPECTOMY    . COLONOSCOPY WITH PROPOFOL N/A 04/29/2017   Procedure: COLONOSCOPY WITH PROPOFOL;  Surgeon: [name], MD;  Location: [facility] ENDOSCOPY;  Service: Endoscopy;  Laterality: N/A;  . JOINT REPLACEMENT     bilateral knee  . left wrist      surgery x 2   . OPEN REDUCTION INTERNAL FIXATION (ORIF) DISTAL RADIAL FRACTURE Right 06/30/2015    Procedure: OPEN REDUCTION INTERNAL FIXATION (ORIF) RIGHT DISTAL RADIUS FRACTURE ;  Surgeon: [name], MD;  Location: [facility];  Service: Orthopedics;  Laterality: Right;  . OTHER SURGICAL HISTORY Right    leg - melonoma  . SHOULDER ARTHROSCOPY WITH OPEN ROTATOR CUFF REPAIR Right 10/02/2017   Procedure: Right shoulder mini open rotator cuff repair;  Surgeon: Susa Day, MD;  Location: [facility];  Service: Orthopedics;  Laterality: Right;  90 mins  . TONSILLECTOMY    . TOTAL KNEE ARTHROPLASTY  08/11/2011   Procedure: TOTAL KNEE ARTHROPLASTY;  Surgeon: [name], MD;  Location: [facility];  Service: Orthopedics;  Laterality: Left;  . TOTAL KNEE ARTHROPLASTY Left 2010    There were no vitals filed for this visit.   Subjective Assessment - 04/10/20 1347    Subjective Patient reports feeling well since the last visit. Patient states she has been working on exercises and has a decrease in pain.    Pertinent History LBP. Pain onset 4-5 years ago insidiously. Patient has received multiple epidurals for managing the pain with good results. Patient reports MRI positive for pinched nerve L3-4 and L5-S1.  Attended physical therapy 9 visits with some improvement. Agg: flexion activities, sit <> stand, transfers. Ease: moving out of agg position; moving slowly and carefully. Reports HTN, Hyperlipidemia, Cancer (melanoma dx 1970), 2006 lumbar decompression for nerve pain    Limitations Lifting;House hold activities    How long can you sit comfortably? indefinitely but pain during transfer    How long can you stand comfortably? indefinitely    How long can you walk comfortably? indefinitely    Diagnostic tests MRI positive as described above    Patient Stated Goals Improve core strength; reduce pain    Currently in Pain? No/denies    Pain Onset More than a month ago             TREATMENT  Therapeutic Exercise  Prayer stretch with physioball - x 10   Leaning forward driving through heels - x 10   Pelvic tilt in sitting - 2 x 10 Standing lumbar extension with hands on hips - -x 10  Hip hikes in standing off of a step - x 10  Prone hip extension 2 x 10  Cervical retraction/Chin tucks 2 x 10 Cervical extension self snag w/ towel 2 x 10    Performed exercises to address pain and spasms        PT Education - 04/10/20 1349    Education Details form/technique with exercise    Person(s) Educated Patient    Methods Explanation    Comprehension Verbalized understanding;Returned demonstration            PT Short Term Goals - 03/29/20 1641      PT SHORT TERM GOAL #1   Title Patient will demonstrate adherence to HEP 3x/wk as adjunct to clinical therapy to speed recovery and reduce total number of visits.    Baseline HEP given    Time 2    Period Weeks    Status New             PT Long Term Goals - 03/29/20 1641      PT LONG TERM GOAL #1   Title Patient will improve score on FOTO score by a significant value to indicate significant improvement in Lumbar functioning    Time 6    Period Weeks    Status New      PT LONG TERM GOAL #2   Title Patient will report no increased pain with meal prep activities to indicate improved pain response with repeated flexion.    Baseline 9-10/10;    Time 6    Period Weeks    Status New      PT LONG TERM GOAL #3   Title Patient will be able to perform a sit to stand transfer without increase in pain to indicate improvement in pain and muscular guarding    Baseline increased pain (6/10) with performance    Time 6    Period Weeks    Status New                 Plan - 04/10/20 1351    Clinical Impression Statement Overall, patient demonstrates improvement with pain and is able to effeciently perform sit to stand exercises. However, patient still has some pain and increased symptom reproduction when peforming pelvic tilts. Patient will benefit from skilled therapy to return to prior level of function.    Personal Factors and  Comorbidities Comorbidity 1;Comorbidity 2;Comorbidity 3+;Age    Comorbidities HTN, Hyperlipidemia, Cancer    Examination-Activity Limitations Carry;Stand;Stairs;Locomotion Level;Bend;Lift;Squat;Sit  Examination-Participation Restrictions Cleaning;Laundry;Meal Prep    Stability/Clinical Decision Making Stable/Uncomplicated    Rehab Potential Good    PT Frequency 2x / week    PT Duration 6 weeks    PT Treatment/Interventions ADLs/Self Care Home Management;Moist Heat;Functional mobility training;Therapeutic activities;Therapeutic exercise;Patient/family education;Neuromuscular re-education;Balance training;Stair training;Manual techniques;Passive range of motion;Joint Manipulations;Taping;Dry needling;Electrical Stimulation;Cryotherapy    PT Next Visit Plan advance HEP. progress thoracic mobilization    PT Home Exercise Plan See education section    Consulted and Agree with Plan of Care Patient           Patient will benefit from skilled therapeutic intervention in order to improve the following deficits and impairments:  Decreased balance,Decreased endurance,Decreased mobility,Difficulty walking,Hypomobility,Increased muscle spasms,Impaired sensation,Decreased range of motion,Improper body mechanics,Pain,Impaired flexibility,Decreased strength,Decreased activity tolerance,Abnormal gait  Visit Diagnosis: Chronic right-sided low back pain without sciatica  Pain in left hip  Muscle weakness (generalized)  Chronic left-sided low back pain without sciatica  Pain in right hip     Problem List Patient Active Problem List   Diagnosis Date Noted  . Overactive bladder 12/23/2018  . PAT (paroxysmal atrial tachycardia) ([place]) 09/30/2018  . Adult idiopathic generalized osteoporosis 06/04/2018  . OSA on CPAP 03/29/2018  . Recurrent UTI 12/27/2017  . Renal cyst 12/27/2017  . Rotator cuff tear, right 10/02/2017  . Complete rotator cuff tear 10/02/2017  . Tubular adenoma 05/27/2017  .  Spondylolisthesis 05/13/2017  . Spinal stenosis of lumbar region 05/13/2017  . Neuritis of saphenous nerve 05/13/2017  . Lumbar radiculopathy 05/13/2017  . Essential hypertension 07/31/2016  . Renal artery stenosis ([place]) 07/31/2016  . DJD (degenerative joint disease) 07/31/2016  . Low serum vitamin D 07/09/2015  . Distal radius fracture, right 06/30/2015  . Hyperlipidemia, mixed 04/10/2015  . History of malignant melanoma 02/13/2014  . Pulmonary emboli ([place]) 06/17/2013  . Pernicious anemia 06/17/2013  . Lumbar disc disease 06/17/2013  . Cervical disc disease 06/17/2013  . Basal cell carcinoma of right cheek 04/28/2013  . S/P left TKA 08/11/2011    Jaynie Crumble, SPT 04/10/2020, 2:20 PM  [facility] [address] [place], [address] Phone: [phone]   Fax:  [phone]  Name: Anita Bailey MRN: [id number] Date of Birth: 24-Oct-1934

## 2020-04-12 ENCOUNTER — Ambulatory Visit: Payer: Medicare Other

## 2020-04-17 ENCOUNTER — Ambulatory Visit: Payer: Medicare Other

## 2020-04-24 ENCOUNTER — Ambulatory Visit: Payer: Medicare Other | Attending: Specialist

## 2020-04-24 ENCOUNTER — Other Ambulatory Visit: Payer: Self-pay

## 2020-04-24 ENCOUNTER — Ambulatory Visit: Payer: Medicare Other

## 2020-04-24 DIAGNOSIS — M25552 Pain in left hip: Secondary | ICD-10-CM

## 2020-04-24 DIAGNOSIS — G8929 Other chronic pain: Secondary | ICD-10-CM | POA: Insufficient documentation

## 2020-04-24 DIAGNOSIS — M6281 Muscle weakness (generalized): Secondary | ICD-10-CM

## 2020-04-24 DIAGNOSIS — M545 Low back pain, unspecified: Secondary | ICD-10-CM

## 2020-04-24 DIAGNOSIS — M25551 Pain in right hip: Secondary | ICD-10-CM | POA: Diagnosis present

## 2020-04-24 NOTE — Therapy (Signed)
[facility] [address] [place], [address] Phone: [phone]   Fax:  [phone]  Physical Therapy Treatment  Patient Details  Name: Anita Bailey MRN: [id number] Date of Birth: [birth year] Referring Provider (PT): Tonita Cong MD   Encounter Date: 04/24/2020   PT End of Session - 04/24/20 1639    Visit Number 4    Number of Visits 30    Date for PT Re-Evaluation 06/23/19    Authorization Type 2/10    PT Start Time 1436    PT Stop Time 1517    PT Time Calculation (min) 41 min    Equipment Utilized During Treatment Gait belt    Activity Tolerance Patient tolerated treatment well;No increased pain    Behavior During Therapy WFL for tasks assessed/performed           Past Medical History:  Diagnosis Date  . Anemia   . Arthritis   . Cancer ([place])    hx of melanoma in 1971, basal cell   . Cervical disc disease   . Chronic kidney disease    current uti 08/05/11   . Depression   . DVT (deep venous thrombosis) ([place]) 01/2015   was Eliquis until 06/13/15-   . Dysrhythmia    hx of heart racing - in past, "none  in years".  had a stress test > 7 years  . History of recurrent UTIs    "bladder infection"  Had a PICC line in past  . Hyperlipidemia   . Hypertension   . Melanoma ([place])   . Nephrolithiasis   . Pulmonary emboli ([facility])    bilateral 8/12 hospitalized   . Vitamin D deficiency     Past Surgical History:  Procedure Laterality Date  . ABDOMINAL HYSTERECTOMY     1981  . BACK SURGERY    . BREAST EXCISIONAL BIOPSY Right 1979   Negative  . COLONOSCOPY W/ POLYPECTOMY    . COLONOSCOPY WITH PROPOFOL N/A 04/29/2017   Procedure: COLONOSCOPY WITH PROPOFOL;  Surgeon: [name], MD;  Location: [facility] ENDOSCOPY;  Service: Endoscopy;  Laterality: N/A;  . JOINT REPLACEMENT     bilateral knee  . left wrist      surgery x 2   . OPEN REDUCTION INTERNAL FIXATION (ORIF) DISTAL RADIAL FRACTURE Right 06/30/2015    Procedure: OPEN REDUCTION INTERNAL FIXATION (ORIF) RIGHT DISTAL RADIUS FRACTURE ;  Surgeon: [name], MD;  Location: [facility];  Service: Orthopedics;  Laterality: Right;  . OTHER SURGICAL HISTORY Right    leg - melonoma  . SHOULDER ARTHROSCOPY WITH OPEN ROTATOR CUFF REPAIR Right 10/02/2017   Procedure: Right shoulder mini open rotator cuff repair;  Surgeon: Susa Day, MD;  Location: [facility];  Service: Orthopedics;  Laterality: Right;  90 mins  . TONSILLECTOMY    . TOTAL KNEE ARTHROPLASTY  08/11/2011   Procedure: TOTAL KNEE ARTHROPLASTY;  Surgeon: [name], MD;  Location: [facility];  Service: Orthopedics;  Laterality: Left;  . TOTAL KNEE ARTHROPLASTY Left 2010    There were no vitals filed for this visit.   Subjective Assessment - 04/24/20 1635    Subjective Patient reports that LBP has decreased since the last session. Patient states that she has some increased pain in R UE that presented a couple of days ago. Patient also reports concerns of transfers from the tub to standing.    Pertinent History LBP. Pain onset 4-5 years ago insidiously. Patient has received multiple epidurals  for managing the pain with good results. Patient reports MRI positive for pinched nerve L3-4 and L5-S1. Attended physical therapy 9 visits with some improvement. Agg: flexion activities, sit <> stand, transfers. Ease: moving out of agg position; moving slowly and carefully. Reports HTN, Hyperlipidemia, Cancer (melanoma dx 1970), 2006 lumbar decompression for nerve pain    Limitations Lifting;House hold activities    How long can you sit comfortably? indefinitely but pain during transfer    How long can you stand comfortably? indefinitely    How long can you walk comfortably? indefinitely    Diagnostic tests MRI positive as described above    Patient Stated Goals Improve core strength; reduce pain    Currently in Pain? No/denies    Pain Onset More than a month ago             TREATMENT Therapeutic  Exercise Physioball roll out - 2 x 10 with 3 sec holds   Posterior/anterior Pelvic tilt in sitting - 2 x 10 Standing lumbar extension with hands on hips - -x 10  Hip hikes in standing off of a step w/ UE support 2 - x 10 bilat  Prone hip extension 2 x 10 bilat  Cervical retraction/Chin tucks x 10 seated edge of plinth  Cervical extension self snag w/ towel overpressure at lower cervical spine 1x10 Hip abduction in standing with RTB around ankles w/ UE support 3 x 10  Hip extension in standing with RTB around ankles w/UE support 3 x 10  B LTR w/ 3 second hold x 10     Performed exercises to address pain and spasms      PT Education - 04/24/20 1638    Education Details form/technique with exercise    Person(s) Educated Patient    Methods Explanation;Demonstration    Comprehension Verbalized understanding;Returned demonstration            PT Short Term Goals - 03/29/20 1641      PT SHORT TERM GOAL #1   Title Patient will demonstrate adherence to HEP 3x/wk as adjunct to clinical therapy to speed recovery and reduce total number of visits.    Baseline HEP given    Time 2    Period Weeks    Status New             PT Long Term Goals - 03/29/20 1641      PT LONG TERM GOAL #1   Title Patient will improve score on FOTO score by a significant value to indicate significant improvement in Lumbar functioning    Time 6    Period Weeks    Status New      PT LONG TERM GOAL #2   Title Patient will report no increased pain with meal prep activities to indicate improved pain response with repeated flexion.    Baseline 9-10/10;    Time 6    Period Weeks    Status New      PT LONG TERM GOAL #3   Title Patient will be able to perform a sit to stand transfer without increase in pain to indicate improvement in pain and muscular guarding    Baseline increased pain (6/10) with performance    Time 6    Period Weeks    Status New                 Plan - 04/24/20 1642     Clinical Impression Statement Patient showing progress in general AEB less pain exacerbation thoughout  session. Pt was able to progress to redTB for open chain extension and abduction at the hip, however this was provocative gluteal pain/symptoms- will try modification next time.  Patient will further benefit from skilled therapy to return to prior level of function.    Personal Factors and Comorbidities Comorbidity 1;Comorbidity 2;Comorbidity 3+;Age    Comorbidities HTN, Hyperlipidemia, Cancer    Examination-Activity Limitations Carry;Stand;Stairs;Locomotion Level;Bend;Lift;Squat;Sit    Examination-Participation Restrictions Cleaning;Laundry;Meal Prep    Stability/Clinical Decision Making Stable/Uncomplicated    Rehab Potential Good    PT Frequency 2x / week    PT Duration 6 weeks    PT Treatment/Interventions ADLs/Self Care Home Management;Moist Heat;Functional mobility training;Therapeutic activities;Therapeutic exercise;Patient/family education;Neuromuscular re-education;Balance training;Stair training;Manual techniques;Passive range of motion;Joint Manipulations;Taping;Dry needling;Electrical Stimulation;Cryotherapy    PT Next Visit Plan advance HEP. progress thoracic mobilization    PT Home Exercise Plan See education section    Consulted and Agree with Plan of Care Patient           Patient will benefit from skilled therapeutic intervention in order to improve the following deficits and impairments:  Decreased balance,Decreased endurance,Decreased mobility,Difficulty walking,Hypomobility,Increased muscle spasms,Impaired sensation,Decreased range of motion,Improper body mechanics,Pain,Impaired flexibility,Decreased strength,Decreased activity tolerance,Abnormal gait  Visit Diagnosis: Chronic right-sided low back pain without sciatica  Pain in left hip  Pain in right hip  Muscle weakness (generalized)  Chronic left-sided low back pain without sciatica     Problem List Patient  Active Problem List   Diagnosis Date Noted  . Overactive bladder 12/23/2018  . PAT (paroxysmal atrial tachycardia) ([place]) 09/30/2018  . Adult idiopathic generalized osteoporosis 06/04/2018  . OSA on CPAP 03/29/2018  . Recurrent UTI 12/27/2017  . Renal cyst 12/27/2017  . Rotator cuff tear, right 10/02/2017  . Complete rotator cuff tear 10/02/2017  . Tubular adenoma 05/27/2017  . Spondylolisthesis 05/13/2017  . Spinal stenosis of lumbar region 05/13/2017  . Neuritis of saphenous nerve 05/13/2017  . Lumbar radiculopathy 05/13/2017  . Essential hypertension 07/31/2016  . Renal artery stenosis ([place]) 07/31/2016  . DJD (degenerative joint disease) 07/31/2016  . Low serum vitamin D 07/09/2015  . Distal radius fracture, right 06/30/2015  . Hyperlipidemia, mixed 04/10/2015  . History of malignant melanoma 02/13/2014  . Pulmonary emboli ([place]) 06/17/2013  . Pernicious anemia 06/17/2013  . Lumbar disc disease 06/17/2013  . Cervical disc disease 06/17/2013  . Basal cell carcinoma of right cheek 04/28/2013  . S/P left TKA 08/11/2011    Jaynie Crumble, SPT 04/24/2020, 4:50 PM  [facility] [address] [place], [address] Phone: [phone]   Fax:  [phone]  Name: MIRAYA CUDNEY MRN: [id number] Date of Birth: 1934/12/10

## 2020-05-01 ENCOUNTER — Ambulatory Visit: Payer: Medicare Other

## 2020-05-03 ENCOUNTER — Ambulatory Visit: Payer: Medicare Other

## 2020-05-08 ENCOUNTER — Ambulatory Visit: Payer: Medicare Other

## 2020-05-08 ENCOUNTER — Other Ambulatory Visit: Payer: Self-pay

## 2020-05-08 DIAGNOSIS — M25551 Pain in right hip: Secondary | ICD-10-CM

## 2020-05-08 DIAGNOSIS — M545 Low back pain, unspecified: Secondary | ICD-10-CM | POA: Diagnosis not present

## 2020-05-08 DIAGNOSIS — G8929 Other chronic pain: Secondary | ICD-10-CM

## 2020-05-08 DIAGNOSIS — M25552 Pain in left hip: Secondary | ICD-10-CM

## 2020-05-08 DIAGNOSIS — M6281 Muscle weakness (generalized): Secondary | ICD-10-CM

## 2020-05-08 NOTE — Therapy (Signed)
[facility] [address] [place], [address] Phone: [phone]   Fax:  [phone]  Physical Therapy Treatment  Patient Details  Name: Anita Bailey MRN: [id number] Date of Birth: [birth year] Referring Provider (PT): Tonita Cong MD   Encounter Date: 05/08/2020   PT End of Session - 05/08/20 1525    Visit Number 5    Number of Visits 30    Date for PT Re-Evaluation 06/23/19    Authorization Type 2/10    PT Start Time 4193    PT Stop Time 1430    PT Time Calculation (min) 42 min    Equipment Utilized During Treatment Gait belt    Activity Tolerance Patient tolerated treatment well;No increased pain    Behavior During Therapy WFL for tasks assessed/performed           Past Medical History:  Diagnosis Date  . Anemia   . Arthritis   . Cancer ([place])    hx of melanoma in 1971, basal cell   . Cervical disc disease   . Chronic kidney disease    current uti 08/05/11   . Depression   . DVT (deep venous thrombosis) ([place]) 01/2015   was Eliquis until 06/13/15-   . Dysrhythmia    hx of heart racing - in past, "none  in years".  had a stress test > 7 years  . History of recurrent UTIs    "bladder infection"  Had a PICC line in past  . Hyperlipidemia   . Hypertension   . Melanoma ([place])   . Nephrolithiasis   . Pulmonary emboli ([facility])    bilateral 8/12 hospitalized   . Vitamin D deficiency     Past Surgical History:  Procedure Laterality Date  . ABDOMINAL HYSTERECTOMY     1981  . BACK SURGERY    . BREAST EXCISIONAL BIOPSY Right 1979   Negative  . COLONOSCOPY W/ POLYPECTOMY    . COLONOSCOPY WITH PROPOFOL N/A 04/29/2017   Procedure: COLONOSCOPY WITH PROPOFOL;  Surgeon: [name], MD;  Location: [facility] ENDOSCOPY;  Service: Endoscopy;  Laterality: N/A;  . JOINT REPLACEMENT     bilateral knee  . left wrist      surgery x 2   . OPEN REDUCTION INTERNAL FIXATION (ORIF) DISTAL RADIAL FRACTURE Right 06/30/2015    Procedure: OPEN REDUCTION INTERNAL FIXATION (ORIF) RIGHT DISTAL RADIUS FRACTURE ;  Surgeon: [name], MD;  Location: [facility];  Service: Orthopedics;  Laterality: Right;  . OTHER SURGICAL HISTORY Right    leg - melonoma  . SHOULDER ARTHROSCOPY WITH OPEN ROTATOR CUFF REPAIR Right 10/02/2017   Procedure: Right shoulder mini open rotator cuff repair;  Surgeon: Susa Day, MD;  Location: [facility]S;  Service: Orthopedics;  Laterality: Right;  90 mins  . TONSILLECTOMY    . TOTAL KNEE ARTHROPLASTY  08/11/2011   Procedure: TOTAL KNEE ARTHROPLASTY;  Surgeon: [name], MD;  Location: [facility];  Service: Orthopedics;  Laterality: Left;  . TOTAL KNEE ARTHROPLASTY Left 2010    There were no vitals filed for this visit.   Subjective Assessment - 05/08/20 1512    Subjective Patient reports no significant changes with her LBP symptoms. However, patient is still having increased pain in R shoulder and received cortisone shot a couple of days ago that shes states is not alleviating the symtpoms yet.    Pertinent History LBP. Pain onset 4-5 years ago insidiously. Patient has received multiple epidurals for managing  the pain with good results. Patient reports MRI positive for pinched nerve L3-4 and L5-S1. Attended physical therapy 9 visits with some improvement. Agg: flexion activities, sit <> stand, transfers. Ease: moving out of agg position; moving slowly and carefully. Reports HTN, Hyperlipidemia, Cancer (melanoma dx 1970), 2006 lumbar decompression for nerve pain    Limitations Lifting;House hold activities    How long can you sit comfortably? indefinitely but pain during transfer    How long can you stand comfortably? indefinitely    How long can you walk comfortably? indefinitely    Diagnostic tests MRI positive as described above    Patient Stated Goals Improve core strength; reduce pain    Currently in Pain? No/denies    Pain Onset More than a month ago             TREATMENT Therapeutic  Exercise Physioball roll out - 2 x 10 with 3 sec holds  Posterior/anterior Pelvic tilt in sitting - 2 x 10 Posterior/anterior pelvic tilt sitting on physioball for further core stability x 20  Alternating seated marches on physioball x 20  Standing lumbar extension with hands on hips - -x 10  Hip hikes in standing off of a step w/ UE support 2 - x 10 bilat  Alternating Prone hip extensions 3 x 5  Hip abduction in standing with RTB around ankles w/ UE support x 20  Hip extension in standing with RTB around ankles w/UE support x 20   B LTR w/ 3 second hold x 10       Performed exercises to address pain and spasms   PT Education - 05/08/20 1525    Education Details form/technique with exercise    Person(s) Educated Patient    Methods Explanation;Demonstration    Comprehension Verbalized understanding;Returned demonstration            PT Short Term Goals - 03/29/20 1641      PT SHORT TERM GOAL #1   Title Patient will demonstrate adherence to HEP 3x/wk as adjunct to clinical therapy to speed recovery and reduce total number of visits.    Baseline HEP given    Time 2    Period Weeks    Status New             PT Long Term Goals - 03/29/20 1641      PT LONG TERM GOAL #1   Title Patient will improve score on FOTO score by a significant value to indicate significant improvement in Lumbar functioning    Time 6    Period Weeks    Status New      PT LONG TERM GOAL #2   Title Patient will report no increased pain with meal prep activities to indicate improved pain response with repeated flexion.    Baseline 9-10/10;    Time 6    Period Weeks    Status New      PT LONG TERM GOAL #3   Title Patient will be able to perform a sit to stand transfer without increase in pain to indicate improvement in pain and muscular guarding    Baseline increased pain (6/10) with performance    Time 6    Period Weeks    Status New                 Plan - 05/08/20 1526    Clinical  Impression Statement Patient continues to show improvement with mobility and was able to tolerate and control movement while  performing anterior/posterior tilts on physioball. Patient also demonstrates improvement with prone hip extension strengthening exercises. Patient still has some increased difficulty while doing standing hip abduction exercises with RTB after multiple repetitions. Patient will further benefit from skilled therapy to improve strength and return to prior level of function.    Personal Factors and Comorbidities Comorbidity 1;Comorbidity 2;Comorbidity 3+;Age    Comorbidities HTN, Hyperlipidemia, Cancer    Examination-Activity Limitations Carry;Stand;Stairs;Locomotion Level;Bend;Lift;Squat;Sit    Examination-Participation Restrictions Cleaning;Laundry;Meal Prep    Stability/Clinical Decision Making Stable/Uncomplicated    Rehab Potential Good    PT Frequency 2x / week    PT Duration 6 weeks    PT Treatment/Interventions ADLs/Self Care Home Management;Moist Heat;Functional mobility training;Therapeutic activities;Therapeutic exercise;Patient/family education;Neuromuscular re-education;Balance training;Stair training;Manual techniques;Passive range of motion;Joint Manipulations;Taping;Dry needling;Electrical Stimulation;Cryotherapy    PT Next Visit Plan advance HEP. progress thoracic mobilization    PT Home Exercise Plan See education section    Consulted and Agree with Plan of Care Patient           Patient will benefit from skilled therapeutic intervention in order to improve the following deficits and impairments:  Decreased balance,Decreased endurance,Decreased mobility,Difficulty walking,Hypomobility,Increased muscle spasms,Impaired sensation,Decreased range of motion,Improper body mechanics,Pain,Impaired flexibility,Decreased strength,Decreased activity tolerance,Abnormal gait  Visit Diagnosis: Chronic right-sided low back pain without sciatica  Pain in left hip  Pain  in right hip  Chronic left-sided low back pain without sciatica  Muscle weakness (generalized)     Problem List Patient Active Problem List   Diagnosis Date Noted  . Overactive bladder 12/23/2018  . PAT (paroxysmal atrial tachycardia) ([place]) 09/30/2018  . Adult idiopathic generalized osteoporosis 06/04/2018  . OSA on CPAP 03/29/2018  . Recurrent UTI 12/27/2017  . Renal cyst 12/27/2017  . Rotator cuff tear, right 10/02/2017  . Complete rotator cuff tear 10/02/2017  . Tubular adenoma 05/27/2017  . Spondylolisthesis 05/13/2017  . Spinal stenosis of lumbar region 05/13/2017  . Neuritis of saphenous nerve 05/13/2017  . Lumbar radiculopathy 05/13/2017  . Essential hypertension 07/31/2016  . Renal artery stenosis ([place]) 07/31/2016  . DJD (degenerative joint disease) 07/31/2016  . Low serum vitamin D 07/09/2015  . Distal radius fracture, right 06/30/2015  . Hyperlipidemia, mixed 04/10/2015  . History of malignant melanoma 02/13/2014  . Pulmonary emboli ([place]) 06/17/2013  . Pernicious anemia 06/17/2013  . Lumbar disc disease 06/17/2013  . Cervical disc disease 06/17/2013  . Basal cell carcinoma of right cheek 04/28/2013  . S/P left TKA 08/11/2011   Salem Caster. Fairly IV, PT, DPT Physical Therapist- [facility]  05/08/2020, 4:15 PM  [facility] [address] [place], [address] Phone: [phone]   Fax:  [phone]  Name: Anita Bailey MRN: [id number] Date of Birth: February 08, 1934

## 2020-05-10 ENCOUNTER — Ambulatory Visit: Payer: Medicare Other

## 2020-05-14 ENCOUNTER — Ambulatory Visit: Payer: Medicare Other | Attending: Specialist

## 2020-05-14 DIAGNOSIS — M545 Low back pain, unspecified: Secondary | ICD-10-CM | POA: Insufficient documentation

## 2020-05-14 DIAGNOSIS — M25552 Pain in left hip: Secondary | ICD-10-CM | POA: Insufficient documentation

## 2020-05-14 DIAGNOSIS — G8929 Other chronic pain: Secondary | ICD-10-CM | POA: Insufficient documentation

## 2020-05-14 DIAGNOSIS — M25551 Pain in right hip: Secondary | ICD-10-CM | POA: Insufficient documentation

## 2020-05-14 DIAGNOSIS — M6281 Muscle weakness (generalized): Secondary | ICD-10-CM | POA: Insufficient documentation

## 2020-05-17 ENCOUNTER — Other Ambulatory Visit: Payer: Self-pay

## 2020-05-17 ENCOUNTER — Other Ambulatory Visit: Payer: Self-pay | Admitting: Specialist

## 2020-05-17 ENCOUNTER — Ambulatory Visit: Payer: Medicare Other

## 2020-05-17 DIAGNOSIS — M545 Low back pain, unspecified: Secondary | ICD-10-CM | POA: Diagnosis not present

## 2020-05-17 DIAGNOSIS — M25552 Pain in left hip: Secondary | ICD-10-CM

## 2020-05-17 DIAGNOSIS — M25551 Pain in right hip: Secondary | ICD-10-CM

## 2020-05-17 DIAGNOSIS — M6281 Muscle weakness (generalized): Secondary | ICD-10-CM

## 2020-05-17 DIAGNOSIS — M25512 Pain in left shoulder: Secondary | ICD-10-CM

## 2020-05-17 DIAGNOSIS — G8929 Other chronic pain: Secondary | ICD-10-CM | POA: Diagnosis present

## 2020-05-17 NOTE — Therapy (Signed)
[facility] [address], [place], [address] Phone: [phone]   Fax:  [phone]  Physical Therapy Treatment  Patient Details  Name: Anita Bailey MRN: [id number] Date of Birth: 12/12/1934 Referring Provider (PT): Tonita Cong MD   Encounter Date: 05/17/2020   PT End of Session - 05/17/20 1505    Visit Number 6    Number of Visits 30    Date for PT Re-Evaluation 06/23/19    Authorization Type 6/10    PT Start Time 1300    PT Stop Time 1345    PT Time Calculation (min) 45 min    Equipment Utilized During Treatment Gait belt    Activity Tolerance Patient tolerated treatment well;No increased pain    Behavior During Therapy WFL for tasks assessed/performed           Past Medical History:  Diagnosis Date  . Anemia   . Arthritis   . Cancer ([place])    hx of melanoma in 1971, basal cell   . Cervical disc disease   . Chronic kidney disease    current uti 08/05/11   . Depression   . DVT (deep venous thrombosis) ([place]) 01/2015   was Eliquis until 06/13/15-   . Dysrhythmia    hx of heart racing - in past, "none  in years".  had a stress test > 7 years  . History of recurrent UTIs    "bladder infection"  Had a PICC line in past  . Hyperlipidemia   . Hypertension   . Melanoma ([place])   . Nephrolithiasis   . Pulmonary emboli ([facility])    bilateral 8/12 hospitalized   . Vitamin D deficiency     Past Surgical History:  Procedure Laterality Date  . ABDOMINAL HYSTERECTOMY     1981  . BACK SURGERY    . BREAST EXCISIONAL BIOPSY Right 1979   Negative  . COLONOSCOPY W/ POLYPECTOMY    . COLONOSCOPY WITH PROPOFOL N/A 04/29/2017   Procedure: COLONOSCOPY WITH PROPOFOL;  Surgeon: [name], MD;  Location: [facility] ENDOSCOPY;  Service: Endoscopy;  Laterality: N/A;  . JOINT REPLACEMENT     bilateral knee  . left wrist      surgery x 2   . OPEN REDUCTION INTERNAL FIXATION (ORIF) DISTAL RADIAL FRACTURE Right 06/30/2015    Procedure: OPEN REDUCTION INTERNAL FIXATION (ORIF) RIGHT DISTAL RADIUS FRACTURE ;  Surgeon: [name], MD;  Location: [facility];  Service: Orthopedics;  Laterality: Right;  . OTHER SURGICAL HISTORY Right    leg - melonoma  . SHOULDER ARTHROSCOPY WITH OPEN ROTATOR CUFF REPAIR Right 10/02/2017   Procedure: Right shoulder mini open rotator cuff repair;  Surgeon: Susa Day, MD;  Location: [facility];  Service: Orthopedics;  Laterality: Right;  90 mins  . TONSILLECTOMY    . TOTAL KNEE ARTHROPLASTY  08/11/2011   Procedure: TOTAL KNEE ARTHROPLASTY;  Surgeon: [name], MD;  Location: [facility];  Service: Orthopedics;  Laterality: Left;  . TOTAL KNEE ARTHROPLASTY Left 2010    There were no vitals filed for this visit.   Subjective Assessment - 05/17/20 1456    Subjective Patient states no current LBP, but she is still having some pain in R shoulder.    Pertinent History LBP. Pain onset 4-5 years ago insidiously. Patient has received multiple epidurals for managing the pain with good results. Patient reports MRI positive for pinched nerve L3-4 and L5-S1. Attended physical therapy 9 visits with some  improvement. Agg: flexion activities, sit <> stand, transfers. Ease: moving out of agg position; moving slowly and carefully. Reports HTN, Hyperlipidemia, Cancer (melanoma dx 1970), 2006 lumbar decompression for nerve pain    Limitations Lifting;House hold activities    How long can you sit comfortably? indefinitely but pain during transfer    How long can you stand comfortably? indefinitely    How long can you walk comfortably? indefinitely    Diagnostic tests MRI positive as described above    Patient Stated Goals Improve core strength; reduce pain    Currently in Pain? No/denies    Pain Onset More than a month ago           TREATMENT Therapeutic Exercise Nustep Seat 7 Level 3, 5 mins for warm up  Physioball roll out - 2 x 10 with 3 sec holds  Physioball roll outs with deviations x 10 with  3secH  Posterior/anterior Pelvic tilt in sitting - 2 x 10 Posterior/anterior pelvic tilt sitting on physioball for further core stability x 20  Alternating seated marches on physioball x 20  Hip hikes in standing off of a step w/ UE support 2 - x 10 bilat  Hip abduction in standing with RTB around ankles w/ UE support x 20  Hip extension in standing with RTB around ankles w/UE support x 20           Performed exercises to address pain and spasms      PT Education - 05/17/20 1458    Education Details form/technique with exercise    Person(s) Educated Patient    Methods Explanation;Demonstration    Comprehension Verbalized understanding;Returned demonstration            PT Short Term Goals - 03/29/20 1641      PT SHORT TERM GOAL #1   Title Patient will demonstrate adherence to HEP 3x/wk as adjunct to clinical therapy to speed recovery and reduce total number of visits.    Baseline HEP given    Time 2    Period Weeks    Status New             PT Long Term Goals - 03/29/20 1641      PT LONG TERM GOAL #1   Title Patient will improve score on FOTO score by a significant value to indicate significant improvement in Lumbar functioning    Time 6    Period Weeks    Status New      PT LONG TERM GOAL #2   Title Patient will report no increased pain with meal prep activities to indicate improved pain response with repeated flexion.    Baseline 9-10/10;    Time 6    Period Weeks    Status New      PT LONG TERM GOAL #3   Title Patient will be able to perform a sit to stand transfer without increase in pain to indicate improvement in pain and muscular guarding    Baseline increased pain (6/10) with performance    Time 6    Period Weeks    Status New                 Plan - 05/17/20 1511    Clinical Impression Statement Therapy session continues to focus on lumbar and hip mobility. Patient shows an improvement with the ability to tolerate the performance of  lumbar flexion physioball roll outs. However, patient demonstrates increased difficulty with standing hip abduction/extension resistance exercises after multiple repetitions.  Patient will further benefit from skilled therapy services to improve strength and return to prior level of function.    Personal Factors and Comorbidities Comorbidity 1;Comorbidity 2;Comorbidity 3+;Age    Comorbidities HTN, Hyperlipidemia, Cancer    Examination-Activity Limitations Carry;Stand;Stairs;Locomotion Level;Bend;Lift;Squat;Sit    Examination-Participation Restrictions Cleaning;Laundry;Meal Prep    Stability/Clinical Decision Making Stable/Uncomplicated    Rehab Potential Good    PT Frequency 2x / week    PT Duration 6 weeks    PT Treatment/Interventions ADLs/Self Care Home Management;Moist Heat;Functional mobility training;Therapeutic activities;Therapeutic exercise;Patient/family education;Neuromuscular re-education;Balance training;Stair training;Manual techniques;Passive range of motion;Joint Manipulations;Taping;Dry needling;Electrical Stimulation;Cryotherapy    PT Next Visit Plan advance HEP. progress thoracic mobilization    PT Home Exercise Plan See education section    Consulted and Agree with Plan of Care Patient           Patient will benefit from skilled therapeutic intervention in order to improve the following deficits and impairments:  Decreased balance,Decreased endurance,Decreased mobility,Difficulty walking,Hypomobility,Increased muscle spasms,Impaired sensation,Decreased range of motion,Improper body mechanics,Pain,Impaired flexibility,Decreased strength,Decreased activity tolerance,Abnormal gait  Visit Diagnosis: Chronic right-sided low back pain without sciatica  Pain in left hip  Pain in right hip  Muscle weakness (generalized)     Problem List Patient Active Problem List   Diagnosis Date Noted  . Overactive bladder 12/23/2018  . PAT (paroxysmal atrial tachycardia) ([place])  09/30/2018  . Adult idiopathic generalized osteoporosis 06/04/2018  . OSA on CPAP 03/29/2018  . Recurrent UTI 12/27/2017  . Renal cyst 12/27/2017  . Rotator cuff tear, right 10/02/2017  . Complete rotator cuff tear 10/02/2017  . Tubular adenoma 05/27/2017  . Spondylolisthesis 05/13/2017  . Spinal stenosis of lumbar region 05/13/2017  . Neuritis of saphenous nerve 05/13/2017  . Lumbar radiculopathy 05/13/2017  . Essential hypertension 07/31/2016  . Renal artery stenosis ([place]) 07/31/2016  . DJD (degenerative joint disease) 07/31/2016  . Low serum vitamin D 07/09/2015  . Distal radius fracture, right 06/30/2015  . Hyperlipidemia, mixed 04/10/2015  . History of malignant melanoma 02/13/2014  . Pulmonary emboli ([place]) 06/17/2013  . Pernicious anemia 06/17/2013  . Lumbar disc disease 06/17/2013  . Cervical disc disease 06/17/2013  . Basal cell carcinoma of right cheek 04/28/2013  . S/P left TKA 08/11/2011    Salem Caster. Fairly IV, PT, DPT Physical Therapist- [facility]  05/17/2020, 5:11 PM  [facility] [address], [place], [address] Phone: [phone]   Fax:  [phone]  Name: MATHEW STORCK MRN: [id number] Date of Birth: April 12, 1934

## 2020-05-21 ENCOUNTER — Ambulatory Visit: Payer: Medicare Other

## 2020-05-23 ENCOUNTER — Ambulatory Visit: Payer: Medicare Other

## 2020-05-28 ENCOUNTER — Other Ambulatory Visit: Payer: Self-pay

## 2020-05-28 ENCOUNTER — Ambulatory Visit: Payer: Medicare Other

## 2020-05-28 DIAGNOSIS — M6281 Muscle weakness (generalized): Secondary | ICD-10-CM

## 2020-05-28 DIAGNOSIS — M545 Low back pain, unspecified: Secondary | ICD-10-CM

## 2020-05-28 DIAGNOSIS — G8929 Other chronic pain: Secondary | ICD-10-CM

## 2020-05-28 NOTE — Therapy (Signed)
[facility] [address] [place], [address] Phone: [phone]   Fax:  [phone]  Physical Therapy Treatment  Patient Details  Name: Anita Bailey MRN: [id number] Date of Birth: 1934-05-08 Referring Provider (PT): Tonita Cong MD   Encounter Date: 05/28/2020   PT End of Session - 05/28/20 1530    Visit Number 7    Number of Visits 30    Date for PT Re-Evaluation 06/23/19    Authorization Type 7/10    PT Start Time 1528    PT Stop Time 1601    PT Time Calculation (min) 33 min    Equipment Utilized During Treatment Gait belt    Activity Tolerance Patient tolerated treatment well;No increased pain    Behavior During Therapy WFL for tasks assessed/performed           Past Medical History:  Diagnosis Date  . Anemia   . Arthritis   . Cancer ([place])    hx of melanoma in 1971, basal cell   . Cervical disc disease   . Chronic kidney disease    current uti 08/05/11   . Depression   . DVT (deep venous thrombosis) ([place]) 01/2015   was Eliquis until 06/13/15-   . Dysrhythmia    hx of heart racing - in past, "none  in years".  had a stress test > 7 years  . History of recurrent UTIs    "bladder infection"  Had a PICC line in past  . Hyperlipidemia   . Hypertension   . Melanoma ([place])   . Nephrolithiasis   . Pulmonary emboli ([facility])    bilateral 8/12 hospitalized   . Vitamin D deficiency     Past Surgical History:  Procedure Laterality Date  . ABDOMINAL HYSTERECTOMY     1981  . BACK SURGERY    . BREAST EXCISIONAL BIOPSY Right 1979   Negative  . COLONOSCOPY W/ POLYPECTOMY    . COLONOSCOPY WITH PROPOFOL N/A 04/29/2017   Procedure: COLONOSCOPY WITH PROPOFOL;  Surgeon: [name], MD;  Location: [facility];  Service: Endoscopy;  Laterality: N/A;  . JOINT REPLACEMENT     bilateral knee  . left wrist      surgery x 2   . OPEN REDUCTION INTERNAL FIXATION (ORIF) DISTAL RADIAL FRACTURE Right 06/30/2015    Procedure: OPEN REDUCTION INTERNAL FIXATION (ORIF) RIGHT DISTAL RADIUS FRACTURE ;  Surgeon: [name], MD;  Location: [facility];  Service: Orthopedics;  Laterality: Right;  . OTHER SURGICAL HISTORY Right    leg - melonoma  . SHOULDER ARTHROSCOPY WITH OPEN ROTATOR CUFF REPAIR Right 10/02/2017   Procedure: Right shoulder mini open rotator cuff repair;  Surgeon: Susa Day, MD;  Location: [facility];  Service: Orthopedics;  Laterality: Right;  90 mins  . TONSILLECTOMY    . TOTAL KNEE ARTHROPLASTY  08/11/2011   Procedure: TOTAL KNEE ARTHROPLASTY;  Surgeon: [name], MD;  Location: [facility];  Service: Orthopedics;  Laterality: Left;  . TOTAL KNEE ARTHROPLASTY Left 2010    There were no vitals filed for this visit.   Subjective Assessment - 05/28/20 1529    Subjective Pt reports getting over a stomach virus. No LBP currently. Only has pain with forward bending.    Pertinent History LBP. Pain onset 4-5 years ago insidiously. Patient has received multiple epidurals for managing the pain with good results. Patient reports MRI positive for pinched nerve L3-4 and L5-S1. Attended physical therapy 9 visits with  some improvement. Agg: flexion activities, sit <> stand, transfers. Ease: moving out of agg position; moving slowly and carefully. Reports HTN, Hyperlipidemia, Cancer (melanoma dx 1970), 2006 lumbar decompression for nerve pain    Limitations Lifting;House hold activities    How long can you sit comfortably? indefinitely but pain during transfer    How long can you stand comfortably? indefinitely    How long can you walk comfortably? indefinitely    Diagnostic tests MRI positive as described above    Patient Stated Goals Improve core strength; reduce pain    Currently in Pain? No/denies    Pain Score 0-No pain    Pain Onset More than a month ago           TREATMENT Therapeutic Exercise   Physioball roll out - 2 x 12 with 3 sec holds  Physioball roll outs with deviations x 12/direction  with 3secH  Posterior/anterior pelvic tilt sitting on physioball for further core stability x 20  Alternating seated marches on physioball x 20. TC's on hip and shoulder for upright posture. VC's for decreasing speed on L hip flexion.  Seated hip ER with RTB for resistance: 2x20/LE. 3 sec holds. Seated hip marches with RTB: 2x20  Hip hikes in standing off of a step w/ UE support 2 - x 10 bilat, 3# AW's. PT demo for form/technique.   Hip abduction in standing with 3# AW's w/ UE support x 20  Hip extension in standing with 3# AW's w/ UE support x 20     PT Education - 05/28/20 1530    Education Details form/technique with exercise.    Person(s) Educated Patient    Methods Explanation;Demonstration    Comprehension Verbalized understanding;Returned demonstration            PT Short Term Goals - 03/29/20 1641      PT SHORT TERM GOAL #1   Title Patient will demonstrate adherence to HEP 3x/wk as adjunct to clinical therapy to speed recovery and reduce total number of visits.    Baseline HEP given    Time 2    Period Weeks    Status New             PT Long Term Goals - 03/29/20 1641      PT LONG TERM GOAL #1   Title Patient will improve score on FOTO score by a significant value to indicate significant improvement in Lumbar functioning    Time 6    Period Weeks    Status New      PT LONG TERM GOAL #2   Title Patient will report no increased pain with meal prep activities to indicate improved pain response with repeated flexion.    Baseline 9-10/10;    Time 6    Period Weeks    Status New      PT LONG TERM GOAL #3   Title Patient will be able to perform a sit to stand transfer without increase in pain to indicate improvement in pain and muscular guarding    Baseline increased pain (6/10) with performance    Time 6    Period Weeks    Status New                 Plan - 05/28/20 1543    Clinical Impression Statement Therapy session limited to pt being late to PT.  Continuing POC of improving lumbar mobility and hip strength. Pt displaying improved motor control with resisted hip abduction and extension.  Pt still fatigues easily with these exercises however. Pt will continue to benefit from skilled PT services to improve LE strength and improve LBP so pt can return to PLOF.    Personal Factors and Comorbidities Comorbidity 1;Comorbidity 2;Comorbidity 3+;Age    Comorbidities HTN, Hyperlipidemia, Cancer    Examination-Activity Limitations Carry;Stand;Stairs;Locomotion Level;Bend;Lift;Squat;Sit    Examination-Participation Restrictions Cleaning;Laundry;Meal Prep    Stability/Clinical Decision Making Stable/Uncomplicated    Rehab Potential Good    PT Frequency 2x / week    PT Duration 6 weeks    PT Treatment/Interventions ADLs/Self Care Home Management;Moist Heat;Functional mobility training;Therapeutic activities;Therapeutic exercise;Patient/family education;Neuromuscular re-education;Balance training;Stair training;Manual techniques;Passive range of motion;Joint Manipulations;Taping;Dry needling;Electrical Stimulation;Cryotherapy    PT Next Visit Plan advance HEP. progress thoracic mobilization    PT Home Exercise Plan See education section    Consulted and Agree with Plan of Care Patient           Patient will benefit from skilled therapeutic intervention in order to improve the following deficits and impairments:  Decreased balance,Decreased endurance,Decreased mobility,Difficulty walking,Hypomobility,Increased muscle spasms,Impaired sensation,Decreased range of motion,Improper body mechanics,Pain,Impaired flexibility,Decreased strength,Decreased activity tolerance,Abnormal gait  Visit Diagnosis: Chronic right-sided low back pain without sciatica  Muscle weakness (generalized)     Problem List Patient Active Problem List   Diagnosis Date Noted  . Overactive bladder 12/23/2018  . PAT (paroxysmal atrial tachycardia) ([place]) 09/30/2018  . Adult  idiopathic generalized osteoporosis 06/04/2018  . OSA on CPAP 03/29/2018  . Recurrent UTI 12/27/2017  . Renal cyst 12/27/2017  . Rotator cuff tear, right 10/02/2017  . Complete rotator cuff tear 10/02/2017  . Tubular adenoma 05/27/2017  . Spondylolisthesis 05/13/2017  . Spinal stenosis of lumbar region 05/13/2017  . Neuritis of saphenous nerve 05/13/2017  . Lumbar radiculopathy 05/13/2017  . Essential hypertension 07/31/2016  . Renal artery stenosis ([place]) 07/31/2016  . DJD (degenerative joint disease) 07/31/2016  . Low serum vitamin D 07/09/2015  . Distal radius fracture, right 06/30/2015  . Hyperlipidemia, mixed 04/10/2015  . History of malignant melanoma 02/13/2014  . Pulmonary emboli ([place]) 06/17/2013  . Pernicious anemia 06/17/2013  . Lumbar disc disease 06/17/2013  . Cervical disc disease 06/17/2013  . Basal cell carcinoma of right cheek 04/28/2013  . S/P left TKA 08/11/2011    Salem Caster. Fairly IV, PT, DPT Physical Therapist- [facility]  05/28/2020, 5:23 PM  [facility] [address] [place], [address] Phone: [phone]   Fax:  [phone]  Name: Anita Bailey MRN: [id number] Date of Birth: Sep 22, 1934

## 2020-05-31 ENCOUNTER — Ambulatory Visit: Payer: Medicare Other

## 2020-05-31 ENCOUNTER — Other Ambulatory Visit: Payer: Self-pay

## 2020-05-31 ENCOUNTER — Ambulatory Visit
Admission: RE | Admit: 2020-05-31 | Discharge: 2020-05-31 | Disposition: A | Discharge status: Home / Self Care | Payer: Medicare Other | Source: Ambulatory Visit | Attending: Specialist | Admitting: Specialist

## 2020-05-31 DIAGNOSIS — M25512 Pain in left shoulder: Secondary | ICD-10-CM

## 2020-05-31 DIAGNOSIS — G8929 Other chronic pain: Secondary | ICD-10-CM

## 2020-05-31 DIAGNOSIS — M25551 Pain in right hip: Secondary | ICD-10-CM

## 2020-05-31 DIAGNOSIS — M545 Low back pain, unspecified: Secondary | ICD-10-CM | POA: Diagnosis not present

## 2020-05-31 DIAGNOSIS — M25552 Pain in left hip: Secondary | ICD-10-CM

## 2020-05-31 DIAGNOSIS — M6281 Muscle weakness (generalized): Secondary | ICD-10-CM

## 2020-05-31 NOTE — Therapy (Addendum)
[facility] [address] [place], [address] Phone: [phone]   Fax:  [phone]  Physical Therapy Reassessment & Discharge   Patient Details  Name: Anita Bailey MRN: [id number] Date of Birth: 05-Sep-1934 Referring Provider (PT): Tonita Cong MD   Encounter Date: 05/31/2020   PT End of Session - 05/31/20 1455    Visit Number 8    Number of Visits 30    Date for PT Re-Evaluation 06/23/19    Authorization Type Medicare Primary; [employer] Secondary    Authorization Time Period 03/29/20-06/22/20    PT Start Time 1305    PT Stop Time 6962    PT Time Calculation (min) 33 min    Activity Tolerance Patient tolerated treatment well;No increased pain    Behavior During Therapy WFL for tasks assessed/performed           Past Medical History:  Diagnosis Date  . Anemia   . Arthritis   . Cancer ([place])    hx of melanoma in 1971, basal cell   . Cervical disc disease   . Chronic kidney disease    current uti 08/05/11   . Depression   . DVT (deep venous thrombosis) ([place]) 01/2015   was Eliquis until 06/13/15-   . Dysrhythmia    hx of heart racing - in past, "none  in years".  had a stress test > 7 years  . History of recurrent UTIs    "bladder infection"  Had a PICC line in past  . Hyperlipidemia   . Hypertension   . Melanoma ([place])   . Nephrolithiasis   . Pulmonary emboli ([facility])    bilateral 8/12 hospitalized   . Vitamin D deficiency     Past Surgical History:  Procedure Laterality Date  . ABDOMINAL HYSTERECTOMY     1981  . BACK SURGERY    . BREAST EXCISIONAL BIOPSY Right 1979   Negative  . COLONOSCOPY W/ POLYPECTOMY    . COLONOSCOPY WITH PROPOFOL N/A 04/29/2017   Procedure: COLONOSCOPY WITH PROPOFOL;  Surgeon: [name], MD;  [facility];  Service: Endoscopy;  Laterality: N/A;  . JOINT REPLACEMENT     bilateral knee  . left wrist      surgery x 2   . OPEN REDUCTION INTERNAL FIXATION (ORIF)  DISTAL RADIAL FRACTURE Right 06/30/2015   Procedure: OPEN REDUCTION INTERNAL FIXATION (ORIF) RIGHT DISTAL RADIUS FRACTURE ;  Surgeon: [name], MD;  Location: [facility];  Service: Orthopedics;  Laterality: Right;  . OTHER SURGICAL HISTORY Right    leg - melonoma  . SHOULDER ARTHROSCOPY WITH OPEN ROTATOR CUFF REPAIR Right 10/02/2017   Procedure: Right shoulder mini open rotator cuff repair;  Surgeon: Susa Day, MD;  Location: [facility];  Service: Orthopedics;  Laterality: Right;  90 mins  . TONSILLECTOMY    . TOTAL KNEE ARTHROPLASTY  08/11/2011   Procedure: TOTAL KNEE ARTHROPLASTY;  Surgeon: [name], MD;  Location: [facility];  Service: Orthopedics;  Laterality: Left;  . TOTAL KNEE ARTHROPLASTY Left 2010    There were no vitals filed for this visit.   Subjective Assessment - 05/31/20 1315    Subjective Pt doing well reports well in general. Will have a MRI tonight for left shoulder.    Pertinent History LBP. Pain onset 4-5 years ago insidiously. Patient has received multiple epidurals for managing the pain with good results. Patient reports MRI positive for pinched nerve L3-4 and L5-S1. Attended physical therapy  9 visits with some improvement. Agg: flexion activities, sit <> stand, transfers. Ease: moving out of agg position; moving slowly and carefully. Reports HTN, Hyperlipidemia, Cancer (melanoma dx 1970), 2006 lumbar decompression for nerve pain    Limitations Lifting;House hold activities    How long can you sit comfortably? indefinitely but pain during transfer    How long can you stand comfortably? indefinitely    How long can you walk comfortably? indefinitely    Currently in Pain? No/denies   does not typically have pain when sitting or resting.             [facility] PT Assessment - 05/31/20 0001      Assessment   Medical Diagnosis LBP    Referring Provider (PT) Beane MD    Onset Date/Surgical Date 01/13/14    Hand Dominance Right    Next MD Visit unknown    Prior Therapy  Yes for same issue      [place] residence    Living Arrangements Alone    Available Help at Discharge Family    Type of [place] Access Level entry    [place] One level    Additional Comments walk in shower      Strength   Right Hip Flexion 5/5    Right Hip External Rotation  5/5    Right Hip Internal Rotation 5/5    Left Hip Flexion 5/5    Left Hip External Rotation 5/5   4-/5 at eval 3/17   Left Hip Internal Rotation 4+/5   4/5 at eval 3/17   Right Knee Flexion 5/5    Right Knee Extension 5/5    Left Knee Flexion 5/5    Left Knee Extension 5/5               PT Short Term Goals - 03/29/20 1641      PT SHORT TERM GOAL #1   Title Patient will demonstrate adherence to HEP 3x/wk as adjunct to clinical therapy to speed recovery and reduce total number of visits.    Baseline HEP given    Time 2    Period Weeks    Status New             PT Long Term Goals - 05/31/20 1317      PT LONG TERM GOAL #1   Title Patient will improve score on FOTO score by a significant value to indicate significant improvement in Lumbar functioning    Status Unable to assess      PT LONG TERM GOAL #2   Title Patient will report no increased pain with meal prep activities to indicate improved pain response with repeated flexion.    Baseline dishwasher still difficult    Time 6    Period Weeks      PT LONG TERM GOAL #3   Title Patient will be able to perform a sit to stand transfer without increase in pain to indicate improvement in pain and muscular guarding    Baseline Still having trouble from lower depths and without armrests    Time 6    Period Weeks    Status On-going                 Plan - 05/31/20 1457    Clinical Impression Statement Reassessment performed this date, as goals and outcome measures not updated in ~8 weeks, thrown off slightly by several missed  visits due to illness. FOTO goal unmet as initital  assessment never performed. Pt has made big progress toward other treatment goals. She also has made significant improvement in MMT. PT reports she would like to DC today and transition to independent exericses at home. HEP is updated.    Personal Factors and Comorbidities Comorbidity 1;Comorbidity 2;Comorbidity 3+;Age    Comorbidities HTN, Hyperlipidemia, Cancer    Examination-Activity Limitations Carry;Stand;Stairs;Locomotion Level;Bend;Lift;Squat;Sit    Examination-Participation Restrictions Cleaning;Laundry;Meal Prep    Stability/Clinical Decision Making Stable/Uncomplicated    Clinical Decision Making Low    Rehab Potential Good    PT Frequency 2x / week    PT Duration 12 weeks    PT Treatment/Interventions ADLs/Self Care Home Management;Moist Heat;Functional mobility training;Therapeutic activities;Therapeutic exercise;Patient/family education;Neuromuscular re-education;Balance training;Stair training;Manual techniques;Passive range of motion;Joint Manipulations;Taping;Dry needling;Electrical Stimulation;Cryotherapy    PT Next Visit Plan There is no next visit, nor plan    PT Home Exercise Plan updated this visit: added seated hip IR c band    Consulted and Agree with Plan of Care Patient           Patient will benefit from skilled therapeutic intervention in order to improve the following deficits and impairments:  Decreased balance,Decreased endurance,Decreased mobility,Difficulty walking,Hypomobility,Increased muscle spasms,Impaired sensation,Decreased range of motion,Improper body mechanics,Pain,Impaired flexibility,Decreased strength,Decreased activity tolerance,Abnormal gait  Visit Diagnosis: Chronic right-sided low back pain without sciatica  Muscle weakness (generalized)  Pain in left hip  Pain in right hip  Chronic left-sided low back pain without sciatica     Problem List Patient Active Problem List   Diagnosis Date Noted  . Overactive bladder 12/23/2018  . PAT  (paroxysmal atrial tachycardia) ([place]) 09/30/2018  . Adult idiopathic generalized osteoporosis 06/04/2018  . OSA on CPAP 03/29/2018  . Recurrent UTI 12/27/2017  . Renal cyst 12/27/2017  . Rotator cuff tear, right 10/02/2017  . Complete rotator cuff tear 10/02/2017  . Tubular adenoma 05/27/2017  . Spondylolisthesis 05/13/2017  . Spinal stenosis of lumbar region 05/13/2017  . Neuritis of saphenous nerve 05/13/2017  . Lumbar radiculopathy 05/13/2017  . Essential hypertension 07/31/2016  . Renal artery stenosis ([place]) 07/31/2016  . DJD (degenerative joint disease) 07/31/2016  . Low serum vitamin D 07/09/2015  . Distal radius fracture, right 06/30/2015  . Hyperlipidemia, mixed 04/10/2015  . History of malignant melanoma 02/13/2014  . Pulmonary emboli ([place]) 06/17/2013  . Pernicious anemia 06/17/2013  . Lumbar disc disease 06/17/2013  . Cervical disc disease 06/17/2013  . Basal cell carcinoma of right cheek 04/28/2013  . S/P left TKA 08/11/2011   3:02 PM, 05/31/20 Etta Grandchild, PT, DPT Physical Therapist - [place] [phone] (Office)    Malic Rosten C 05/31/2020, 3:01 PM  [facility] [address], [place], [address] Phone: [phone]   Fax:  [phone]  Name: SHERLE MELLO MRN: [id number] Date of Birth: 1934-08-07

## 2020-06-07 ENCOUNTER — Ambulatory Visit: Payer: Medicare Other

## 2020-06-12 ENCOUNTER — Ambulatory Visit: Payer: Medicare Other

## 2020-08-27 ENCOUNTER — Other Ambulatory Visit: Payer: Self-pay

## 2020-08-27 ENCOUNTER — Emergency Department
Admission: EM | Admit: 2020-08-27 | Discharge: 2020-08-27 | Discharge status: Against Medical Advice | Payer: Medicare Other

## 2020-08-27 ENCOUNTER — Emergency Department: Payer: Medicare Other

## 2020-08-27 ENCOUNTER — Emergency Department
Admission: EM | Admit: 2020-08-27 | Discharge: 2020-08-27 | Disposition: A | Discharge status: Home / Self Care | Payer: Medicare Other | Attending: Emergency Medicine | Admitting: Emergency Medicine

## 2020-08-27 DIAGNOSIS — Z91040 Latex allergy status: Secondary | ICD-10-CM | POA: Diagnosis not present

## 2020-08-27 DIAGNOSIS — N189 Chronic kidney disease, unspecified: Secondary | ICD-10-CM | POA: Insufficient documentation

## 2020-08-27 DIAGNOSIS — Z79899 Other long term (current) drug therapy: Secondary | ICD-10-CM | POA: Insufficient documentation

## 2020-08-27 DIAGNOSIS — S060X0A Concussion without loss of consciousness, initial encounter: Secondary | ICD-10-CM | POA: Diagnosis not present

## 2020-08-27 DIAGNOSIS — Z96652 Presence of left artificial knee joint: Secondary | ICD-10-CM | POA: Diagnosis not present

## 2020-08-27 DIAGNOSIS — W01198A Fall on same level from slipping, tripping and stumbling with subsequent striking against other object, initial encounter: Secondary | ICD-10-CM | POA: Diagnosis not present

## 2020-08-27 DIAGNOSIS — Z940 Kidney transplant status: Secondary | ICD-10-CM | POA: Insufficient documentation

## 2020-08-27 DIAGNOSIS — R110 Nausea: Secondary | ICD-10-CM | POA: Insufficient documentation

## 2020-08-27 DIAGNOSIS — S0990XA Unspecified injury of head, initial encounter: Secondary | ICD-10-CM | POA: Diagnosis present

## 2020-08-27 DIAGNOSIS — I129 Hypertensive chronic kidney disease with stage 1 through stage 4 chronic kidney disease, or unspecified chronic kidney disease: Secondary | ICD-10-CM | POA: Diagnosis not present

## 2020-08-27 DIAGNOSIS — Z85828 Personal history of other malignant neoplasm of skin: Secondary | ICD-10-CM | POA: Insufficient documentation

## 2020-08-27 MED ORDER — KETOROLAC TROMETHAMINE 30 MG/ML IJ SOLN
15.0000 mg | Freq: Once | INTRAMUSCULAR | Status: AC
  Administered 2020-08-27: 15 mg via INTRAMUSCULAR
  Filled 2020-08-27: qty 1

## 2020-08-27 MED ORDER — METHOCARBAMOL 500 MG PO TABS: 500.0000 mg | ORAL_TABLET | Freq: Four times a day (QID) | ORAL | 0 refills | Status: DC

## 2020-08-27 MED ORDER — EXCEDRIN MIGRAINE 250-250-65 MG PO TABS: 1.0000 | ORAL_TABLET | Freq: Four times a day (QID) | ORAL | 0 refills | Status: DC | PRN

## 2020-08-27 MED ORDER — PREDNISONE 10 MG PO TABS: 10.0000 mg | ORAL_TABLET | ORAL | 0 refills | Status: DC

## 2020-08-27 MED ORDER — DIPHENHYDRAMINE HCL 50 MG/ML IJ SOLN
25.0000 mg | Freq: Once | INTRAMUSCULAR | Status: AC
  Administered 2020-08-27: 25 mg via INTRAMUSCULAR
  Filled 2020-08-27: qty 1

## 2020-08-27 MED ORDER — PROMETHAZINE HCL 25 MG/ML IJ SOLN: 12.5000 mg | Freq: Four times a day (QID) | INTRAMUSCULAR | Status: DC | PRN

## 2020-08-27 NOTE — ED Triage Notes (Signed)
First Nurse Note:  knee immobilizer placed by [facility].  Per EMS report, patient tripped  and fell backward, hitting head on ground.  No LOC.

## 2020-08-27 NOTE — ED Provider Notes (Signed)
[facility] Emergency Department Provider Note  ____________________________________________  Time seen: Approximately 7:13 PM  I have reviewed the triage vital signs and the nursing notes.   HISTORY  Chief Complaint Fall    HPI Anita Bailey is a 86 y.o. female who presents the emergency department for evaluation of head injury.  Patient states that she has had a problem with her knee or her knee has had some sharp pains with turning and it gave out and caused her to have to go to orthopedics earlier today.  Patient was placed in a knee immobilizer and was using a wheeled walker for ambulation.  She was sitting on the seat as she was being pushed on the sidewalk and there was an even park causing her to fall backwards striking her head.  Patient did not lose consciousness.  She is complaining of a headache at this time.  She has had some slight confusion, some dizziness and some nausea.  There has been no subsequent loss of consciousness.  No unilateral weakness.  No slurred speech.  No facial droop.  Patient is here for evaluation of the head injury as she has already been evaluated for her knee problem orthopedics.       Past Medical History:  Diagnosis Date   Anemia    Arthritis    Cancer ([place])    hx of melanoma in 1971, basal cell    Cervical disc disease    Chronic kidney disease    current uti 08/05/11    Depression    DVT (deep venous thrombosis) ([place]) 01/2015   was Eliquis until 06/13/15-    Dysrhythmia    hx of heart racing - in past, "none  in years".  had a stress test > 7 years   History of recurrent UTIs    "bladder infection"  Had a PICC line in past   Hyperlipidemia    Hypertension    Melanoma ([place])    Nephrolithiasis    Pulmonary emboli ([place])    bilateral 8/12 hospitalized    Vitamin D deficiency     Patient Active Problem List   Diagnosis Date Noted   Overactive bladder 12/23/2018   PAT (paroxysmal atrial tachycardia) ([place])  09/30/2018   Adult idiopathic generalized osteoporosis 06/04/2018   OSA on CPAP 03/29/2018   Recurrent UTI 12/27/2017   Renal cyst 12/27/2017   Rotator cuff tear, right 10/02/2017   Complete rotator cuff tear 10/02/2017   Tubular adenoma 05/27/2017   Spondylolisthesis 05/13/2017   Spinal stenosis of lumbar region 05/13/2017   Neuritis of saphenous nerve 05/13/2017   Lumbar radiculopathy 05/13/2017   Essential hypertension 07/31/2016   Renal artery stenosis (HCC) 07/31/2016   DJD (degenerative joint disease) 07/31/2016   Low serum vitamin D 07/09/2015   Distal radius fracture, right 06/30/2015   Hyperlipidemia, mixed 04/10/2015   History of malignant melanoma 02/13/2014   Pulmonary emboli ([place]) 06/17/2013   Pernicious anemia 06/17/2013   Lumbar disc disease 06/17/2013   Cervical disc disease 06/17/2013   Basal cell carcinoma of right cheek 04/28/2013   S/P left TKA 08/11/2011    Past Surgical History:  Procedure Laterality Date   ABDOMINAL HYSTERECTOMY     1981   BACK SURGERY     BREAST EXCISIONAL BIOPSY Right 1979   Negative   COLONOSCOPY W/ POLYPECTOMY     COLONOSCOPY WITH PROPOFOL N/A 04/29/2017   Procedure: COLONOSCOPY WITH PROPOFOL;  Surgeon: [name], MD;  Location: [facility] ENDOSCOPY;  Service: Endoscopy;  Laterality: N/A;   JOINT REPLACEMENT     bilateral knee   left wrist      surgery x 2    OPEN REDUCTION INTERNAL FIXATION (ORIF) DISTAL RADIAL FRACTURE Right 06/30/2015   Procedure: OPEN REDUCTION INTERNAL FIXATION (ORIF) RIGHT DISTAL RADIUS FRACTURE ;  Surgeon: [name], MD;  Location: [facility];  Service: Orthopedics;  Laterality: Right;   OTHER SURGICAL HISTORY Right    leg - melonoma   SHOULDER ARTHROSCOPY WITH OPEN ROTATOR CUFF REPAIR Right 10/02/2017   Procedure: Right shoulder mini open rotator cuff repair;  Surgeon: Susa Day, MD;  Location: [facility] ORS;  Service: Orthopedics;  Laterality: Right;  90 mins   TONSILLECTOMY     TOTAL KNEE ARTHROPLASTY   08/11/2011   Procedure: TOTAL KNEE ARTHROPLASTY;  Surgeon: [name], MD;  Location: [facility] ORS;  Service: Orthopedics;  Laterality: Left;   TOTAL KNEE ARTHROPLASTY Left 2010    Prior to Admission medications   Medication Sig Start Date End Date Taking? Authorizing Provider  aspirin-acetaminophen-caffeine (EXCEDRIN MIGRAINE) [phone] MG tablet Take 1 tablet by mouth every 6 (six) hours as needed for headache. 08/27/20  Yes Delancey Moraes, Charline Bills, PA-C  methocarbamol (ROBAXIN) 500 MG tablet Take 1 tablet (500 mg total) by mouth 4 (four) times daily. 08/27/20  Yes Arlett Goold, Charline Bills, PA-C  predniSONE (DELTASONE) 10 MG tablet Take 1 tablet (10 mg total) by mouth as directed. 08/27/20  Yes Trust Crago, Charline Bills, PA-C  Ascorbic Acid (VITAMIN C) 1000 MG tablet Take 1,000 mg by mouth daily.    [provider]  Cholecalciferol (VITAMIN D3) 5000 units TABS Take 5,000 Units by mouth daily.    [provider]  cyanocobalamin (,VITAMIN B-12,) 1000 MCG/ML injection Inject 1,000 mcg into the muscle every 30 (thirty) days.    [provider]  diclofenac sodium (VOLTAREN) 1 % GEL Apply 2 g topically 2 (two) times daily as needed (For pain.).  05/07/15   [provider]  fluocinonide cream (LIDEX) 0.05 % fluocinonide 0.05 % topical cream  APPLY TWICE DAILY TO RASH UNTIL CLEAR    [provider]  LYRICA 50 MG capsule Take 50 mg by mouth 2 (two) times daily. 08/14/17   [provider]  meclizine (ANTIVERT) 25 MG tablet Take 25 mg by mouth every 8 (eight) hours as needed. 12/21/19   [provider]  metoprolol succinate (TOPROL-XL) 50 MG 24 hr tablet Take 50 mg by mouth 2 (two) times daily. Take with or immediately following a meal.    [provider]  mirabegron ER (MYRBETRIQ) 25 MG TB24 tablet Take 1 tablet (25 mg total) by mouth daily. 02/13/20   Stoioff, Ronda Fairly, MD  traMADol (ULTRAM) 50 MG tablet tramadol 50 mg tablet    [provider]  venlafaxine XR (EFFEXOR-XR) 75 MG 24 hr capsule Take 75 mg by mouth daily. 06/05/15   [provider]  zolpidem (AMBIEN) 5 MG tablet Take 2.5-5 mg by mouth at bedtime.     [provider]    Allergies Penicillins, Latex, Sulfa antibiotics, Caffeine, Celebrex [celecoxib], Clindamycin/lincomycin, Diclofenac, Elastic bandages & [zinc], Macrobid [nitrofurantoin monohyd macro], Monosodium glutamate, and Tape  Family History  Problem Relation Age of Onset   Breast cancer Cousin     Social History Social History   Tobacco Use   Smoking status: Never   Smokeless tobacco: Never  Vaping Use   Vaping Use: Never used  Substance Use Topics   Alcohol  use: Yes    Alcohol/week: 1.0 standard drink    Types: 1 Glasses of wine per week   Drug use: No     Review of Systems  Constitutional: No fever/chills Eyes: No visual changes. No discharge ENT: No upper respiratory complaints. Cardiovascular: no chest pain. Respiratory: no cough. No SOB. Gastrointestinal: No abdominal pain.  No nausea, no vomiting.  No diarrhea.  No constipation. Musculoskeletal: Negative for musculoskeletal pain. Skin: Negative for rash, abrasions, lacerations, ecchymosis. Neurological: Patient with headache and dizziness.  No loss of consciousness.  No focal weakness or numbness.  10 System ROS otherwise negative.  ____________________________________________   PHYSICAL EXAM:  VITAL SIGNS: ED Triage Vitals [08/27/20 1750]  Enc Vitals Group     BP      Pulse      Resp      Temp      Temp src      SpO2      Weight 154 lb 15.7 oz (70.3 kg)     Height '5\' 5"'$  (1.651 m)     Head Circumference      Peak Flow      Pain Score 0     Pain Loc      Pain Edu?      Excl. in [place]?      Constitutional: Alert and oriented. Well appearing and in no acute distress. Eyes: Conjunctivae are normal. PERRL. EOMI. Head: Hematoma noted to the occipital skull with no open wounds.  Area slightly tender to  palpation but no palpable abnormality or crepitus.  No battle signs, raccoon eyes, serosanguineous fluid drainage from the ears or nares. ENT:      Ears:       Nose: No congestion/rhinnorhea.      Mouth/Throat: Mucous membranes are moist.  Neck: No stridor.  No cervical spine tenderness to palpation  Cardiovascular: Normal rate, regular rhythm. Normal S1 and S2.  Good peripheral circulation. Respiratory: Normal respiratory effort without tachypnea or retractions. Lungs CTAB. Good air entry to the bases with no decreased or absent breath sounds. Musculoskeletal: Full range of motion to all extremities. No gross deformities appreciated. Neurologic:  Normal speech and language. No gross focal neurologic deficits are appreciated.  Cranial nerves II through XII grossly intact.  Negative pronator drift.  Equal grip strength upper extremities.  Patient has an injury to her knee and is currently immobilized and unable to perform Romberg's. Skin:  Skin is warm, dry and intact. No rash noted. Psychiatric: Mood and affect are normal. Speech and behavior are normal. Patient exhibits appropriate insight and judgement.   ____________________________________________   LABS (all labs ordered are listed, but only abnormal results are displayed)  Labs Reviewed - No data to display ____________________________________________  EKG   ____________________________________________  RADIOLOGY I personally viewed and evaluated these images as part of my medical decision making, as well as reviewing the written report by the radiologist.  ED Provider Interpretation: No evidence of skull fracture or intracranial hemorrhage.  Degenerative changes of the cervical spine without acute fracture.  CT HEAD WO CONTRAST (5MM)  Result Date: 08/27/2020 CLINICAL DATA:  Recent trip and fall with headaches and neck pain, initial encounter EXAM: CT HEAD WITHOUT CONTRAST CT CERVICAL SPINE WITHOUT CONTRAST TECHNIQUE:  Multidetector CT imaging of the head and cervical spine was performed following the standard protocol without intravenous contrast. Multiplanar CT image reconstructions of the cervical spine were also generated. COMPARISON:  01/29/2018 FINDINGS: CT HEAD FINDINGS Brain: No evidence of acute  infarction, hemorrhage, hydrocephalus, extra-axial collection or mass lesion/mass effect. Vascular: No hyperdense vessel or unexpected calcification. Skull: Normal. Negative for fracture or focal lesion. Sinuses/Orbits: No acute finding. Other: Very mild scalp swelling is noted just to the left of the midline in the occipital region consistent with the given clinical history. CT CERVICAL SPINE FINDINGS Alignment: Mild straightening of the normal cervical lordosis is noted. Skull base and vertebrae: 7 cervical segments are well visualized. Vertebral body height is well maintained. Mild disc space narrowing is noted from C3-C6 with mild osteophytic changes. Facet hypertrophic changes are noted as well. No acute fracture or acute facet abnormality is noted. The odontoid is within normal limits. Soft tissues and spinal canal: Surrounding soft tissue structures show no acute abnormality. Upper chest: Visualized lung apices are within normal limits. Other: None IMPRESSION: CT of the head: No acute intracranial abnormality noted. Mild scalp swelling is seen posteriorly consistent with the recent injury. CT of cervical spine: Multilevel degenerative change without acute abnormality. Electronically Signed   By: Inez Catalina M.D.   On: 08/27/2020 19:19   CT Cervical Spine Wo Contrast  Result Date: 08/27/2020 CLINICAL DATA:  Recent trip and fall with headaches and neck pain, initial encounter EXAM: CT HEAD WITHOUT CONTRAST CT CERVICAL SPINE WITHOUT CONTRAST TECHNIQUE: Multidetector CT imaging of the head and cervical spine was performed following the standard protocol without intravenous contrast. Multiplanar CT image reconstructions of  the cervical spine were also generated. COMPARISON:  01/29/2018 FINDINGS: CT HEAD FINDINGS Brain: No evidence of acute infarction, hemorrhage, hydrocephalus, extra-axial collection or mass lesion/mass effect. Vascular: No hyperdense vessel or unexpected calcification. Skull: Normal. Negative for fracture or focal lesion. Sinuses/Orbits: No acute finding. Other: Very mild scalp swelling is noted just to the left of the midline in the occipital region consistent with the given clinical history. CT CERVICAL SPINE FINDINGS Alignment: Mild straightening of the normal cervical lordosis is noted. Skull base and vertebrae: 7 cervical segments are well visualized. Vertebral body height is well maintained. Mild disc space narrowing is noted from C3-C6 with mild osteophytic changes. Facet hypertrophic changes are noted as well. No acute fracture or acute facet abnormality is noted. The odontoid is within normal limits. Soft tissues and spinal canal: Surrounding soft tissue structures show no acute abnormality. Upper chest: Visualized lung apices are within normal limits. Other: None IMPRESSION: CT of the head: No acute intracranial abnormality noted. Mild scalp swelling is seen posteriorly consistent with the recent injury. CT of cervical spine: Multilevel degenerative change without acute abnormality. Electronically Signed   By: Inez Catalina M.D.   On: 08/27/2020 19:19    ____________________________________________    PROCEDURES  Procedure(s) performed:    Procedures    Medications  promethazine (PHENERGAN) injection 12.5 mg (has no administration in time range)  ketorolac (TORADOL) 30 MG/ML injection 15 mg (15 mg Intramuscular Given 08/27/20 2127)  diphenhydrAMINE (BENADRYL) injection 25 mg (25 mg Intramuscular Given 08/27/20 2127)     ____________________________________________   INITIAL IMPRESSION / ASSESSMENT AND PLAN / ED COURSE  Pertinent labs & imaging results that were available during my  care of the patient were reviewed by me and considered in my medical decision making (see chart for details).  Review of the [place] CSRS was performed in accordance of the [place] prior to dispensing any controlled drugs.           Patient's diagnosis is consistent with head trauma, concussion.  Patient presented to the emergency department after striking her head.  She had some dizziness and confusion after striking her head.  Neurologically she was intact on exam.  Imaging revealed no acute traumatic findings to include fracture or intracranial hemorrhage.  There is what appears to be a soft tissue hematoma on CT which is confirmed on physical exam.  At this time patient remains neurologically intact.  There is no indication for further work-up currently.  Patient has instructions regarding concussion and follow-up.  We will treat symptomatically here with medications for her headache.  Will prescribe medication at home to include medication for headache as well as the patient is just finished a steroid taper for her cervical radiculopathy and I will include another prescription for steroids and muscle relaxer in case this aggravates this healing injury..  The patient's daughter is accompanying the patient at this time and I have discussed all these findings with her as well.  Follow-up and return precautions are also discussed with the daughter.  Concerning signs and symptoms are discussed.  Patient is given ED precautions to return to the ED for any worsening or new symptoms.     ____________________________________________  FINAL CLINICAL IMPRESSION(S) / ED DIAGNOSES  Final diagnoses:  Injury of head, initial encounter  Concussion without loss of consciousness, initial encounter      NEW MEDICATIONS STARTED DURING THIS VISIT:  ED Discharge Orders          Ordered    aspirin-acetaminophen-caffeine (EXCEDRIN MIGRAINE) 250-250-65 MG tablet  Every 6 hours PRN        08/27/20 2147     predniSONE (DELTASONE) 10 MG tablet  As directed       Note to Pharmacy: Take on a pattern of 6, 6, 5, 5, 4, 4, 3, 3, 2, 2, 1, 1   08/27/20 2147    methocarbamol (ROBAXIN) 500 MG tablet  4 times daily        08/27/20 2147                This chart was dictated using voice recognition software/Dragon. Despite best efforts to proofread, errors can occur which can change the meaning. Any change was purely unintentional.    Brynda Peon 08/27/20 2148    Carrie Mew, MD 08/27/20 2321

## 2020-08-28 ENCOUNTER — Telehealth: Payer: Self-pay | Admitting: Physician Assistant

## 2020-08-28 MED ORDER — METHOCARBAMOL 500 MG PO TABS: 500.0000 mg | ORAL_TABLET | Freq: Four times a day (QID) | ORAL | 0 refills | Status: AC

## 2020-08-28 MED ORDER — EXCEDRIN MIGRAINE 250-250-65 MG PO TABS: 1.0000 | ORAL_TABLET | Freq: Four times a day (QID) | ORAL | 0 refills | Status: AC | PRN

## 2020-08-28 MED ORDER — PREDNISONE 10 MG PO TABS: 10.0000 mg | ORAL_TABLET | ORAL | 0 refills | Status: AC

## 2020-08-28 NOTE — Telephone Encounter (Cosign Needed)
Patient's daughter called to let us know that the prescriptions were sent to the wrong pharmacy.  She would like to have them resent to [facility] on [address].  Medications were reordered for Excedrin, Robaxin, and prednisone.  Sent to [facility] at the daughter's request.

## 2020-08-30 ENCOUNTER — Other Ambulatory Visit: Payer: Self-pay | Admitting: Orthopedic Surgery

## 2020-08-31 ENCOUNTER — Other Ambulatory Visit: Payer: Self-pay | Admitting: Orthopedic Surgery

## 2020-08-31 DIAGNOSIS — Z96652 Presence of left artificial knee joint: Secondary | ICD-10-CM

## 2020-09-05 ENCOUNTER — Other Ambulatory Visit: Payer: Self-pay | Admitting: Orthopedic Surgery

## 2020-09-05 DIAGNOSIS — M25562 Pain in left knee: Secondary | ICD-10-CM

## 2020-09-06 ENCOUNTER — Other Ambulatory Visit: Payer: Self-pay

## 2020-09-06 ENCOUNTER — Ambulatory Visit
Admission: RE | Admit: 2020-09-06 | Discharge: 2020-09-06 | Disposition: A | Discharge status: Home / Self Care | Payer: Medicare Other | Source: Ambulatory Visit | Attending: Orthopedic Surgery | Admitting: Orthopedic Surgery

## 2020-09-06 DIAGNOSIS — M25562 Pain in left knee: Secondary | ICD-10-CM | POA: Diagnosis not present

## 2020-09-27 ENCOUNTER — Other Ambulatory Visit: Payer: Self-pay | Admitting: Internal Medicine

## 2020-09-27 DIAGNOSIS — Z1231 Encounter for screening mammogram for malignant neoplasm of breast: Secondary | ICD-10-CM

## 2020-10-09 ENCOUNTER — Other Ambulatory Visit: Payer: Self-pay

## 2020-10-09 ENCOUNTER — Emergency Department: Payer: Medicare Other

## 2020-10-09 DIAGNOSIS — R410 Disorientation, unspecified: Secondary | ICD-10-CM | POA: Diagnosis not present

## 2020-10-09 DIAGNOSIS — Z5321 Procedure and treatment not carried out due to patient leaving prior to being seen by health care provider: Secondary | ICD-10-CM | POA: Insufficient documentation

## 2020-10-09 DIAGNOSIS — R42 Dizziness and giddiness: Secondary | ICD-10-CM | POA: Diagnosis present

## 2020-10-09 DIAGNOSIS — R443 Hallucinations, unspecified: Secondary | ICD-10-CM | POA: Diagnosis not present

## 2020-10-09 DIAGNOSIS — W19XXXA Unspecified fall, initial encounter: Secondary | ICD-10-CM | POA: Insufficient documentation

## 2020-10-09 LAB — CBC
HCT: 39.4 % (ref 36.0–46.0)
Hemoglobin: 13 g/dL (ref 12.0–15.0)
MCH: 32.3 pg (ref 26.0–34.0)
MCHC: 33 g/dL (ref 30.0–36.0)
MCV: 98 fL (ref 80.0–100.0)
Platelets: 251 10*3/uL (ref 150–400)
RBC: 4.02 MIL/uL (ref 3.87–5.11)
RDW: 13.2 % (ref 11.5–15.5)
WBC: 6.7 10*3/uL (ref 4.0–10.5)
nRBC: 0 % (ref 0.0–0.2)

## 2020-10-09 NOTE — ED Triage Notes (Signed)
Pt has had multiple falls in the last month, has had ongoing dizziness since. States fell again today due to dizziness and states has had increased confusion and some hallucinations since. Pt also co bilat shoulder stiffness after fall.

## 2020-10-10 ENCOUNTER — Emergency Department
Admission: EM | Admit: 2020-10-10 | Discharge: 2020-10-10 | Disposition: A | Discharge status: Home / Self Care | Payer: Medicare Other | Attending: Emergency Medicine | Admitting: Emergency Medicine

## 2020-10-10 LAB — COMPREHENSIVE METABOLIC PANEL
ALT: 10 U/L (ref 0–44)
AST: 14 U/L — ABNORMAL LOW (ref 15–41)
Albumin: 3.8 g/dL (ref 3.5–5.0)
Alkaline Phosphatase: 77 U/L (ref 38–126)
Anion gap: 8 (ref 5–15)
BUN: 24 mg/dL — ABNORMAL HIGH (ref 8–23)
CO2: 32 mmol/L (ref 22–32)
Calcium: 9.6 mg/dL (ref 8.9–10.3)
Chloride: 100 mmol/L (ref 98–111)
Creatinine, Ser: 1.01 mg/dL — ABNORMAL HIGH (ref 0.44–1.00)
GFR, Estimated: 55 mL/min — ABNORMAL LOW (ref >60)
Glucose, Bld: 92 mg/dL (ref 70–99)
Potassium: 4.3 mmol/L (ref 3.5–5.1)
Sodium: 140 mmol/L (ref 135–145)
Total Bilirubin: 0.8 mg/dL (ref 0.3–1.2)
Total Protein: 6.7 g/dL (ref 6.5–8.1)

## 2020-10-10 LAB — TROPONIN I (HIGH SENSITIVITY): Troponin I (High Sensitivity): 5 ng/L (ref <18)

## 2020-10-23 ENCOUNTER — Other Ambulatory Visit: Payer: Self-pay | Admitting: Specialist

## 2020-10-23 DIAGNOSIS — G8929 Other chronic pain: Secondary | ICD-10-CM

## 2020-10-31 ENCOUNTER — Ambulatory Visit
Admission: RE | Admit: 2020-10-31 | Discharge: 2020-10-31 | Disposition: A | Discharge status: Home / Self Care | Payer: Medicare Other | Source: Ambulatory Visit | Attending: Specialist | Admitting: Specialist

## 2020-10-31 DIAGNOSIS — M545 Low back pain, unspecified: Secondary | ICD-10-CM

## 2020-10-31 DIAGNOSIS — G8929 Other chronic pain: Secondary | ICD-10-CM

## 2020-10-31 MED ORDER — METHYLPREDNISOLONE ACETATE 40 MG/ML INJ SUSP (RADIOLOG
80.0000 mg | Freq: Once | INTRAMUSCULAR | Status: AC
  Administered 2020-10-31: 80 mg via EPIDURAL

## 2020-10-31 MED ORDER — IOPAMIDOL (ISOVUE-M 200) INJECTION 41%
1.0000 mL | Freq: Once | INTRAMUSCULAR | Status: AC
  Administered 2020-10-31: 1 mL via EPIDURAL

## 2020-10-31 NOTE — Discharge Instructions (Signed)

## 2020-11-05 ENCOUNTER — Ambulatory Visit
Admission: RE | Admit: 2020-11-05 | Discharge: 2020-11-05 | Disposition: A | Discharge status: Home / Self Care | Payer: Medicare Other | Source: Ambulatory Visit | Attending: Internal Medicine | Admitting: Internal Medicine

## 2020-11-05 ENCOUNTER — Other Ambulatory Visit: Payer: Self-pay

## 2020-11-05 DIAGNOSIS — Z1231 Encounter for screening mammogram for malignant neoplasm of breast: Secondary | ICD-10-CM | POA: Diagnosis not present

## 2020-12-26 ENCOUNTER — Ambulatory Visit: Payer: Medicare Other | Admitting: Urology

## 2021-04-16 ENCOUNTER — Ambulatory Visit: Payer: Medicare Other

## 2021-04-18 ENCOUNTER — Ambulatory Visit: Payer: Medicare Other

## 2021-04-23 ENCOUNTER — Ambulatory Visit: Payer: Medicare Other | Attending: Sports Medicine

## 2021-04-23 DIAGNOSIS — M6281 Muscle weakness (generalized): Secondary | ICD-10-CM | POA: Diagnosis present

## 2021-04-23 DIAGNOSIS — M542 Cervicalgia: Secondary | ICD-10-CM | POA: Diagnosis present

## 2021-04-23 DIAGNOSIS — R278 Other lack of coordination: Secondary | ICD-10-CM | POA: Diagnosis present

## 2021-04-23 NOTE — Therapy (Signed)
[place] [facility] ?[address] ?[place], [place], [address] ?Phone: [phone]   Fax:  [phone] ? ?Physical Therapy Evaluation ? ?Patient Details  ?Name: Anita Bailey ?MRN: [id number] ?Date of Birth: 08-08-1934 ?Referring Provider (PT): Rosalia Hammers, DO ? ? ?Encounter Date: 04/23/2021 ? ? PT End of Session - 04/23/21 1716   ? ? Visit Number 1   ? Number of Visits 12   ? Date for PT Re-Evaluation 06/04/21   ? Authorization Type Medicare Primary; AARP Secondary   ? Authorization Time Period 04/23/21-06/04/21   ? Progress Note Due on Visit 10   ? PT Start Time 1608   late check in  ? PT Stop Time 0388   ? PT Time Calculation (min) 40 min   ? Activity Tolerance Patient tolerated treatment well;No increased pain;Patient limited by fatigue   ? Behavior During Therapy [facility] for tasks assessed/performed   ? ?  ?  ? ?  ? ? ?Past Medical History:  ?Diagnosis Date  ? Anemia   ? Arthritis   ? Cancer [facility])   ? hx of melanoma in 1971, basal cell   ? Cervical disc disease   ? Chronic kidney disease   ? current uti 08/05/11   ? Depression   ? DVT (deep venous thrombosis) ([place]) 01/2015  ? was Eliquis until 06/13/15-   ? Dysrhythmia   ? hx of heart racing - in past, "none  in years".  had a stress test > 7 years  ? History of recurrent UTIs   ? "bladder infection"  Had a PICC line in past  ? Hyperlipidemia   ? Hypertension   ? Melanoma ([place])   ? Nephrolithiasis   ? Pulmonary emboli ([place])   ? bilateral 8/12 hospitalized   ? Vitamin D deficiency   ? ? ?Past Surgical History:  ?Procedure Laterality Date  ? ABDOMINAL HYSTERECTOMY    ? 1981  ? BACK SURGERY    ? BREAST EXCISIONAL BIOPSY Right 1979  ? Negative  ? COLONOSCOPY W/ POLYPECTOMY    ? COLONOSCOPY WITH PROPOFOL N/A 04/29/2017  ? Procedure: COLONOSCOPY WITH PROPOFOL;  Surgeon: [name], MD;  Location: [facility] ENDOSCOPY;  Service: Endoscopy;  Laterality: N/A;  ? JOINT REPLACEMENT    ? bilateral knee  ? left wrist     ?  surgery x 2   ? OPEN REDUCTION INTERNAL FIXATION (ORIF) DISTAL RADIAL FRACTURE Right 06/30/2015  ? Procedure: OPEN REDUCTION INTERNAL FIXATION (ORIF) RIGHT DISTAL RADIUS FRACTURE ;  Surgeon: [name], MD;  Location: [place];  Service: Orthopedics;  Laterality: Right;  ? OTHER SURGICAL HISTORY Right   ? leg - melonoma  ? SHOULDER ARTHROSCOPY WITH OPEN ROTATOR CUFF REPAIR Right 10/02/2017  ? Procedure: Right shoulder mini open rotator cuff repair;  Surgeon: Susa Day, MD;  Location: [facility];  Service: Orthopedics;  Laterality: Right;  90 mins  ? TONSILLECTOMY    ? TOTAL KNEE ARTHROPLASTY  08/11/2011  ? Procedure: TOTAL KNEE ARTHROPLASTY;  Surgeon: [name], MD;  Location: [facility]S;  Service: Orthopedics;  Laterality: Left;  ? TOTAL KNEE ARTHROPLASTY Left 2010  ? ? ?There were no vitals filed for this visit. ? ? ? Subjective Assessment - 04/23/21 1621   ? ? Subjective Pt reports just finishing PT at [facility] for imbalance and vertigo after a fall with head impact and concussion last year. She reports her neck has been persistently stiff and painful. Her  PT recommended to transition to a site that could dry needling as an adjunct to other therapies.   ? Pertinent History Anita Bailey is an 44yoF who comes to OPPT for chronic neck stiffness, motor control deficits, and pain s/p fall in July 2022 with posterior head impact, concussion. Pt reports no fracture or bleed identified at the time, but pt had post concussion persistent imbalance and vertigo for which she completed PT at [facility] in March 2023. Pt did not make much progress in cervical complaint at [facility] therapist recommended a clinica that could offer dry needling. 1986 history of fall with posterior neck impact on steps and subsequent orthopedic trauma/rehab.   ? Patient Stated Goals Travel plans for the Summer- Pt would like to improve her cervical ROM and improve her end-range pain.   ? Currently in Pain? No/denies   ? ?  ?  ? ?  ? ? ? ? ?  [facility] PT Assessment - 04/23/21 0001   ? ?  ? Assessment  ? Medical Diagnosis Neck stiffness and pain   s/p fall and head impact  ? Referring Provider (PT) Rosalia Hammers, DO   ? Onset Date/Surgical Date --   July 2022  ? Hand Dominance Right   ? Prior [place]   ?  ? Precautions  ? Precautions None   ?  ? Balance Screen  ? Has the patient fallen in the past 6 months No   ? Has the patient had a decrease in activity level because of a fear of falling?  No   ? Is the patient reluctant to leave their home because of a fear of falling?  No   ?  ? Home Environment  ? Living Environment Private residence   ? Living Arrangements Alone   ? Available Help at Discharge Family   ? [place] of [place]  ? Home Access Level entry   ? Home Layout One level   ?  ? Observation/Other Assessments  ? Observations Uses protrusion to return to neutrla from extension   ? Focus on Therapeutic Outcomes (FOTO)  51   ? ?  ?  ? ?  ? ?ROM Assessment 04/23/21 ?Cervical Rotation A/ROM: 45 Left, 55 Right;  (passive 48 Left and 58 Right)  ?Cervical Lateral flexion: 15 degrees left, 25 degrees Right  ?Extension 48 degrees (requires a hand to return to neutral) ? ?DNF Endurance Test: 12 sec ? ? ?Objective measurements completed on examination: See above findings.  ? ? ? ?Access Code: [email or web address] ?URL: [email or web address] ?Date: 04/23/2021 ?Prepared by: Rebbeca Paul ? ?Exercises ?- Supine Cervical Retraction with Towel  - 3 x daily - 7 x weekly - 1 sets - 15 reps - 3 hold ?- Supine Deep Neck Flexor Training - Repetitions  - 3 x daily - 7 x weekly - 1 sets - 10 reps - 5 hold ? ? ? ? ? ? ? ? ? ? PT Education - 04/23/21 1716   ? ? Education Details Updateds HEP for deep neck flexors activation/strengthening, DC sustained stretching   ? Person(s) Educated Patient   ? Methods Explanation;Handout   ? Comprehension Verbalized understanding;Returned demonstration   ? ?  ?  ? ?  ? ? ? PT Short Term Goals - 04/23/21 1729    ? ?  ? PT SHORT TERM GOAL #1  ? Title Pt to demonstrate improved DNF endurance test >20sec.   ? Baseline Eval: 12 sec   ?  Time 3   ? Period Weeks   ? Status New   ? Target Date 05/14/21   ?  ? PT SHORT TERM GOAL #2  ? Title Pt to reports good compliance and understanding of HEP for improving neck strength and postural control.   ? Baseline Issued at eval   ? Time 4   ? Period Weeks   ? Status New   ? Target Date 05/21/21   ? ?  ?  ? ?  ? ? ? ? PT Long Term Goals - 04/23/21 1730   ? ?  ? PT LONG TERM GOAL #1  ? Title Patient will improve score on FOTO survey >10 points to indicate improved ability to perform ADL/IADL.   ? Baseline Eval: 51   ? Time 6   ? Period Weeks   ? Target Date 06/04/21   ?  ? PT LONG TERM GOAL #2  ? Title Pt will reports improved return to neutral from end range cervical rotation while driving.   ? Baseline Unable   ? Time 6   ? Period Weeks   ? Status New   ? Target Date 06/04/21   ? ?  ?  ? ?  ? ? ? ? ? ? ? ? ? Plan - 04/23/21 1718   ? ? Clinical Impression Statement Pt presenting for persistent stiffness, weakness, and pain in neck, exclusively with end-of-range positioning worse with return to neutral after prolonged posturing. Examination revealing of ROM deficits in cervical rotation and lateral flexion with pain at end range, passive and active range assessment nearly spot on. Author is unable to determine end-feel of joints due to end-range pain, however no soft tissue or muscular restriction is noted. Exam also revealing of imbalance between global cervical musculature and cervical stabilizers. Author reviewed imaging reports from prior cervical CT scans, 2 from 2022 post fall, and a remote cervical MRI from 2007- from these reports no C1 or C2 instability is suspected. HEP is updated this date to DC static stretching and to begin training of deep cervical stabilizers. Pt will benefit from skilled PT intervention to restore PLOF in indepdendence and tolerance in ADL/IADL.   ? Personal  Factors and Comorbidities Age;Behavior Pattern;Past/Current Experience;Time since onset of injury/illness/exacerbation   ? Examination-Activity Limitations Stand;Reach Overhead;Transfers   ? Examination-Pa

## 2021-04-24 ENCOUNTER — Ambulatory Visit: Payer: Medicare Other

## 2021-04-29 ENCOUNTER — Ambulatory Visit: Payer: Medicare Other | Admitting: Physical Therapy

## 2021-04-29 ENCOUNTER — Encounter: Payer: Self-pay | Admitting: Physical Therapy

## 2021-04-29 DIAGNOSIS — M542 Cervicalgia: Secondary | ICD-10-CM | POA: Diagnosis not present

## 2021-04-29 DIAGNOSIS — M6281 Muscle weakness (generalized): Secondary | ICD-10-CM

## 2021-04-29 NOTE — Therapy (Signed)
[place] [facility] ?[address] ?[place], [place], [address] ?Phone: [phone]   Fax:  [phone] ? ?Physical Therapy Treatment ? ?Patient Details  ?Name: Anita Bailey ?MRN: [id number] ?Date of Birth: 1935-01-11 ?Referring Provider (PT): Rosalia Hammers, DO ? ? ?Encounter Date: 04/29/2021 ? ? PT End of Session - 04/29/21 1441   ? ? Visit Number 2   ? Number of Visits 12   ? Date for PT Re-Evaluation 06/04/21   ? Authorization Type Medicare Primary; AARP Secondary   ? Authorization Time Period 04/23/21-06/04/21   ? Progress Note Due on Visit 10   ? PT Start Time 1434   ? PT Stop Time 1515   ? PT Time Calculation (min) 41 min   ? Activity Tolerance Patient tolerated treatment well;No increased pain;Patient limited by fatigue   ? Behavior During Therapy [facility] for tasks assessed/performed   ? ?  ?  ? ?  ? ? ?Past Medical History:  ?Diagnosis Date  ? Anemia   ? Arthritis   ? Cancer [facility])   ? hx of melanoma in 1971, basal cell   ? Cervical disc disease   ? Chronic kidney disease   ? current uti 08/05/11   ? Depression   ? DVT (deep venous thrombosis) ([place]) 01/2015  ? was Eliquis until 06/13/15-   ? Dysrhythmia   ? hx of heart racing - in past, "none  in years".  had a stress test > 7 years  ? History of recurrent UTIs   ? "bladder infection"  Had a PICC line in past  ? Hyperlipidemia   ? Hypertension   ? Melanoma ([place])   ? Nephrolithiasis   ? Pulmonary emboli ([place])   ? bilateral 8/12 hospitalized   ? Vitamin D deficiency   ? ? ?Past Surgical History:  ?Procedure Laterality Date  ? ABDOMINAL HYSTERECTOMY    ? 1981  ? BACK SURGERY    ? BREAST EXCISIONAL BIOPSY Right 1979  ? Negative  ? COLONOSCOPY W/ POLYPECTOMY    ? COLONOSCOPY WITH PROPOFOL N/A 04/29/2017  ? Procedure: COLONOSCOPY WITH PROPOFOL;  Surgeon: [name], MD;  Location: [facility] ENDOSCOPY;  Service: Endoscopy;  Laterality: N/A;  ? JOINT REPLACEMENT    ? bilateral knee  ? left wrist     ? surgery x 2   ?  OPEN REDUCTION INTERNAL FIXATION (ORIF) DISTAL RADIAL FRACTURE Right 06/30/2015  ? Procedure: OPEN REDUCTION INTERNAL FIXATION (ORIF) RIGHT DISTAL RADIUS FRACTURE ;  Surgeon: [name], MD;  Location: [place];  Service: Orthopedics;  Laterality: Right;  ? OTHER SURGICAL HISTORY Right   ? leg - melonoma  ? SHOULDER ARTHROSCOPY WITH OPEN ROTATOR CUFF REPAIR Right 10/02/2017  ? Procedure: Right shoulder mini open rotator cuff repair;  Surgeon: Susa Day, MD;  Location: [facility];  Service: Orthopedics;  Laterality: Right;  90 mins  ? TONSILLECTOMY    ? TOTAL KNEE ARTHROPLASTY  08/11/2011  ? Procedure: TOTAL KNEE ARTHROPLASTY;  Surgeon: [name], MD;  Location: [facility];  Service: Orthopedics;  Laterality: Left;  ? TOTAL KNEE ARTHROPLASTY Left 2010  ? ? ?There were no vitals filed for this visit. ? ? Subjective Assessment - 04/29/21 1439   ? ? Subjective Pt reports she continues to have pain in neck with rotation. Denies pain at rest and while looking forward. Is compliant with DNF HEP provided at evaluation.   ? Pertinent History Anita Bailey is an 79yoF who  comes to OPPT for chronic neck stiffness, motor control deficits, and pain s/p fall in July 2022 with posterior head impact, concussion. Pt reports no fracture or bleed identified at the time, but pt had post concussion persistent imbalance and vertigo for which she completed PT at [facility] in March 2023. Pt did not make much progress in cervical complaint at [facility], therapist recommended a clinica that could offer dry needling. 1986 history of fall with posterior neck impact on steps and subsequent orthopedic trauma/rehab.   ? Patient Stated Goals Travel plans for the Summer- Pt would like to improve her cervical ROM and improve her end-range pain.   ? Currently in Pain? No/denies   ? ?  ?  ? ?  ? ? ? ?INTERVENTIONS ? ?Manual Therapy  ?Trigger Point Dry Needling (TDN), unbilled. Education performed with patient regarding potential benefit of TDN.  Reviewed precautions and risks with patient. Pt provided verbal consent to treatment. TDN performed with 0.30 x 30 single needle placements with local twitch response (LTR). Pistoning technique utilized. Improved pain-free motion following intervention. Muscles targeted: bilateral upper traps ?STM utilizing effleurage, p?trissage and trigger point release to   right T-spine extensors, left rhomboids and bilateral upper traps. ? ?Therapeutic Exercise ?NuStep for AAROM warm up, seat 9, arms 10 level 4 for 5 minutes  ?Cervical retraction, supine, with 3 second hold 3x10 reps ?Cervical protraction (DNF strengthening), supine, w/ 3 second hold 3x10 reps ? ? ?Next session: ?MMT strength and sensation screening; FU on DNF HEP, provide prognosis on improving ROM given advanced OA, loss of disc space, and presence of osteophytes. ? ? ? ?Clinical Impression: Pt is pleasant and motivated in session. She presents with significant trigger points in bilateral upper traps, right thoracic extensors and left rhomboids - TDN was performed to bilateral upper traps as pt reported the most pain in this area. Tissue quality improved with pt able to tolerate pressure and light stretching following. Cervical strengthening was progressed; cervical protraction was added to HEP with pt verbalizing and demonstrating understanding. Pt would benefit from continued PT to address cervical mobility limitations that affect her ability to participate in daily activities such as driving and with friends and family.  ? ? ? ? ? ? ? ? ? ? PT Short Term Goals - 04/23/21 1729   ? ?  ? PT SHORT TERM GOAL #1  ? Title Pt to demonstrate improved DNF endurance test >20sec.   ? Baseline Eval: 12 sec   ? Time 3   ? Period Weeks   ? Status New   ? Target Date 05/14/21   ?  ? PT SHORT TERM GOAL #2  ? Title Pt to reports good compliance and understanding of HEP for improving neck strength and postural control.   ? Baseline Issued at eval   ? Time 4   ? Period Weeks    ? Status New   ? Target Date 05/21/21   ? ?  ?  ? ?  ? ? ? ? PT Long Term Goals - 04/23/21 1730   ? ?  ? PT LONG TERM GOAL #1  ? Title Patient will improve score on FOTO survey >10 points to indicate improved ability to perform ADL/IADL.   ? Baseline Eval: 51   ? Time 6   ? Period Weeks   ? Target Date 06/04/21   ?  ? PT LONG TERM GOAL #2  ? Title Pt will reports improved return to neutral from  end range cervical rotation while driving.   ? Baseline Unable   ? Time 6   ? Period Weeks   ? Status New   ? Target Date 06/04/21   ? ?  ?  ? ?  ? ? ? ? ? ? ? ? Plan - 04/29/21 1717   ? ? Clinical Impression Statement Pt is pleasant and motivated in session. She presents with significant trigger points in bilateral upper traps, right thoracic extensors and left rhomboids - TDN was performed to bilateral upper traps as pt reported the most pain in this area. Tissue quality improved with pt able to tolerate pressure and light stretching following. Cervical strengthening was progressed; cervical protraction was added to HEP with pt verbalizing and demonstrating understanding. Pt would benefit from continued PT to address cervical mobility limitations that affect her ability to participate in daily activities such as driving and with friends and family.   ? Personal Factors and Comorbidities Age;Behavior Pattern;Past/Current Experience;Time since onset of injury/illness/exacerbation   ? Examination-Activity Limitations Stand;Reach Overhead;Transfers   ? Examination-Participation Restrictions Cleaning;Driving;Medication Management   ? Stability/Clinical Decision Making Evolving/Moderate complexity   ? Rehab Potential Good   ? PT Frequency 2x / week   ? PT Duration 6 weeks   ? PT Treatment/Interventions ADLs/Self Care Home Management;Moist Heat;Therapeutic exercise;Patient/family education;Manual techniques;Dry needling;Passive range of motion   ? PT Next Visit Plan MMT strength and sensation screening; FU on DNF HEP, provide  prognosis on improving ROM given advanced OA, loss of disc space, and presence of osteophytes.   ? Consulted and Agree with Plan of Care Patient   ? ?  ?  ? ?  ? ? ?Patient will benefit from skilled therapeutic

## 2021-05-09 ENCOUNTER — Encounter: Payer: Self-pay | Admitting: Physical Therapy

## 2021-05-09 ENCOUNTER — Ambulatory Visit: Payer: Medicare Other | Admitting: Physical Therapy

## 2021-05-09 DIAGNOSIS — M542 Cervicalgia: Secondary | ICD-10-CM | POA: Diagnosis not present

## 2021-05-09 NOTE — Therapy (Signed)
[place] [facility] ?[address] ?[place], [place], [address] ?Phone: [phone]   Fax:  [phone] ? ?Physical Therapy Treatment ? ?Patient Details  ?Name: Anita Bailey ?MRN: [id number] ?Date of Birth: 1934-08-21 ?Referring Provider (PT): Rosalia Hammers, DO ? ? ?Encounter Date: 05/09/2021 ? ? PT End of Session - 05/09/21 1308   ? ? Visit Number 3   ? Number of Visits 12   ? Date for PT Re-Evaluation 06/04/21   ? Authorization Type Medicare Primary; AARP Secondary   ? Authorization Time Period 04/23/21-06/04/21   ? Progress Note Due on Visit 10   ? PT Start Time 1301   ? PT Stop Time 4431   ? PT Time Calculation (min) 44 min   ? Activity Tolerance Patient tolerated treatment well;No increased pain;Patient limited by fatigue   ? Behavior During Therapy [facility] for tasks assessed/performed   ? ?  ?  ? ?  ? ? ?Past Medical History:  ?Diagnosis Date  ? Anemia   ? Arthritis   ? Cancer [facility])   ? hx of melanoma in 1971, basal cell   ? Cervical disc disease   ? Chronic kidney disease   ? current uti 08/05/11   ? Depression   ? DVT (deep venous thrombosis) ([place]) 01/2015  ? was Eliquis until 06/13/15-   ? Dysrhythmia   ? hx of heart racing - in past, "none  in years".  had a stress test > 7 years  ? History of recurrent UTIs   ? "bladder infection"  Had a PICC line in past  ? Hyperlipidemia   ? Hypertension   ? Melanoma ([place])   ? Nephrolithiasis   ? Pulmonary emboli ([place])   ? bilateral 8/12 hospitalized   ? Vitamin D deficiency   ? ? ?Past Surgical History:  ?Procedure Laterality Date  ? ABDOMINAL HYSTERECTOMY    ? 1981  ? BACK SURGERY    ? BREAST EXCISIONAL BIOPSY Right 1979  ? Negative  ? COLONOSCOPY W/ POLYPECTOMY    ? COLONOSCOPY WITH PROPOFOL N/A 04/29/2017  ? Procedure: COLONOSCOPY WITH PROPOFOL;  Surgeon: [name], MD;  Location: [facility] ENDOSCOPY;  Service: Endoscopy;  Laterality: N/A;  ? JOINT REPLACEMENT    ? bilateral knee  ? left wrist     ? surgery x 2   ?  OPEN REDUCTION INTERNAL FIXATION (ORIF) DISTAL RADIAL FRACTURE Right 06/30/2015  ? Procedure: OPEN REDUCTION INTERNAL FIXATION (ORIF) RIGHT DISTAL RADIUS FRACTURE ;  Surgeon: [name], MD;  Location: [place];  Service: Orthopedics;  Laterality: Right;  ? OTHER SURGICAL HISTORY Right   ? leg - melonoma  ? SHOULDER ARTHROSCOPY WITH OPEN ROTATOR CUFF REPAIR Right 10/02/2017  ? Procedure: Right shoulder mini open rotator cuff repair;  Surgeon: Susa Day, MD;  Location: [facility];  Service: Orthopedics;  Laterality: Right;  90 mins  ? TONSILLECTOMY    ? TOTAL KNEE ARTHROPLASTY  08/11/2011  ? Procedure: TOTAL KNEE ARTHROPLASTY;  Surgeon: [name], MD;  Location: [facility];  Service: Orthopedics;  Laterality: Left;  ? TOTAL KNEE ARTHROPLASTY Left 2010  ? ? ?There were no vitals filed for this visit. ? ? Subjective Assessment - 05/09/21 1305   ? ? Subjective Pt states she is doing well today. Denies pain at rest and in neutral positioning. She did feel sore for about 3 days following dry needling last session however states she would like DN performed again this date  becuase she feels as though she can turn her head further than before.   ? Pertinent History [name] is an 24yoF who comes to OPPT for chronic neck stiffness, motor control deficits, and pain s/p fall in July 2022 with posterior head impact, concussion. Pt reports no fracture or bleed identified at the time, but pt had post concussion persistent imbalance and vertigo for which she completed PT at [facility] in March 2023. Pt did not make much progress in cervical complaint at [facility], therapist recommended a clinica that could offer dry needling. 1986 history of fall with posterior neck impact on steps and subsequent orthopedic trauma/rehab.   ? Patient Stated Goals Travel plans for the Summer- Pt would like to improve her cervical ROM and improve her end-range pain.   ? Currently in Pain? No/denies   ? ?  ?  ? ?  ? ? ? ? ? ?INTERVENTIONS ?   ?Manual Therapy  ?Trigger Point Dry Needling (TDN), unbilled. Education performed with patient regarding potential benefit of TDN. Reviewed precautions and risks with patient. Pt provided verbal consent to treatment. TDN performed with 0.25 x 30 single needle placements with local twitch response (LTR). Pistoning technique utilized. Improved pain-free motion following intervention. Muscles targeted: bilateral upper traps and right levator scap. ? ?STM utilizing effleurage, p?trissage and trigger point release to left cervical extensors and bilateral upper traps. ? ?Suboccipital release 2x60 seconds  ? ?Hook lying cervical rotation with gentle OP by therapist, x10 each side  ? ?  ?Therapeutic Exercise ?NuStep for AAROM warm up, seat 9, arms 10 level 4 for 5 minutes  ? ?  ?  ?Next session: ?provide prognosis on improving ROM (based on imaging) given advanced OA, loss of disc space, and presence of osteophytes. ?  ?  ?  ?Clinical Impression: Pt is pleasant and motivated in session. PT and pt discussed alternate interventions for dry needling - pt verbalizes she would like to have dry needling performed again this session. PT utilized smaller diameter needle and increased static hold (decreased pistoning) for pt comfort and hopefully decreased sustained pain over the next few days. Followed up with STM. Pt feeling decreased tension however palpable trigger points remain throughout bilateral upper trap and cervical region. Will continue to address. Pt would benefit from continued PT to address cervical mobility limitations that affect her ability to participate in daily activities with friends and family and driving. ? ? ? ? ? ? ? ? ? ? ? PT Short Term Goals - 04/23/21 1729   ? ?  ? PT SHORT TERM GOAL #1  ? Title Pt to demonstrate improved DNF endurance test >20sec.   ? Baseline Eval: 12 sec   ? Time 3   ? Period Weeks   ? Status New   ? Target Date 05/14/21   ?  ? PT SHORT TERM GOAL #2  ? Title Pt to reports good  compliance and understanding of HEP for improving neck strength and postural control.   ? Baseline Issued at eval   ? Time 4   ? Period Weeks   ? Status New   ? Target Date 05/21/21   ? ?  ?  ? ?  ? ? ? ? PT Long Term Goals - 04/23/21 1730   ? ?  ? PT LONG TERM GOAL #1  ? Title Patient will improve score on FOTO survey >10 points to indicate improved ability to perform ADL/IADL.   ? Baseline Eval: 51   ?  Time 6   ? Period Weeks   ? Target Date 06/04/21   ?  ? PT LONG TERM GOAL #2  ? Title Pt will reports improved return to neutral from end range cervical rotation while driving.   ? Baseline Unable   ? Time 6   ? Period Weeks   ? Status New   ? Target Date 06/04/21   ? ?  ?  ? ?  ? ? ? ? ? ? ? ? Plan - 05/09/21 1630   ? ? Clinical Impression Statement Pt is pleasant and motivated in session. PT and pt discussed alternate interventions for dry needling - pt verbalizes she would like to have dry needling performed again this session. PT utilized smaller diameter needle and increased static hold (decreased pistoning) for pt comfort and hopefully decreased sustained pain over the next few days. Followed up with STM. Pt feeling decreased tension however palpable trigger points remain throughout bilateral upper trap and cervical region. Will continue to address. Pt would benefit from continued PT to address cervical mobility limitations that affect her ability to participate in daily activities with friends and family and driving.   ? Personal Factors and Comorbidities Age;Behavior Pattern;Past/Current Experience;Time since onset of injury/illness/exacerbation   ? Examination-Activity Limitations Stand;Reach Overhead;Transfers   ? Examination-Participation Restrictions Cleaning;Driving;Medication Management   ? Stability/Clinical Decision Making Evolving/Moderate complexity   ? Rehab Potential Good   ? PT Frequency 2x / week   ? PT Duration 6 weeks   ? PT Treatment/Interventions ADLs/Self Care Home Management;Moist  Heat;Therapeutic exercise;Patient/family education;Manual techniques;Dry needling;Passive range of motion   ? PT Next Visit Plan MMT strength and sensation screening; FU on DNF HEP, provide prognosis on improving ROM give

## 2021-05-13 ENCOUNTER — Ambulatory Visit: Payer: Medicare Other | Attending: Sports Medicine | Admitting: Physical Therapy

## 2021-05-13 ENCOUNTER — Encounter: Payer: Self-pay | Admitting: Physical Therapy

## 2021-05-13 DIAGNOSIS — M6281 Muscle weakness (generalized): Secondary | ICD-10-CM | POA: Diagnosis present

## 2021-05-13 DIAGNOSIS — M542 Cervicalgia: Secondary | ICD-10-CM | POA: Diagnosis present

## 2021-05-13 DIAGNOSIS — R278 Other lack of coordination: Secondary | ICD-10-CM | POA: Insufficient documentation

## 2021-05-13 NOTE — Therapy (Signed)
[place] [facility] ?[address] ?[place], [place], [address] ?Phone: [phone]   Fax:  [phone] ? ?Physical Therapy Treatment ? ?Patient Details  ?Name: Anita Bailey ?MRN: [id number] ?Date of Birth: Sep 05, 1934 ?Referring Provider (PT): Rosalia Hammers, DO ? ? ?Encounter Date: 05/13/2021 ? ? PT End of Session - 05/13/21 1355   ? ? Visit Number 4   ? Number of Visits 12   ? Date for PT Re-Evaluation 06/04/21   ? Authorization Type Medicare Primary; AARP Secondary   ? Authorization Time Period 04/23/21-06/04/21   ? Progress Note Due on Visit 10   ? PT Start Time 5035   ? PT Stop Time 4656   ? PT Time Calculation (min) 35 min   ? Activity Tolerance Patient tolerated treatment well;No increased pain;Patient limited by fatigue   ? Behavior During Therapy [facility] for tasks assessed/performed   ? ?  ?  ? ?  ? ? ?Past Medical History:  ?Diagnosis Date  ? Anemia   ? Arthritis   ? Cancer [facility])   ? hx of melanoma in 1971, basal cell   ? Cervical disc disease   ? Chronic kidney disease   ? current uti 08/05/11   ? Depression   ? DVT (deep venous thrombosis) ([place]) 01/2015  ? was Eliquis until 06/13/15-   ? Dysrhythmia   ? hx of heart racing - in past, "none  in years".  had a stress test > 7 years  ? History of recurrent UTIs   ? "bladder infection"  Had a PICC line in past  ? Hyperlipidemia   ? Hypertension   ? Melanoma ([place])   ? Nephrolithiasis   ? Pulmonary emboli ([place])   ? bilateral 8/12 hospitalized   ? Vitamin D deficiency   ? ? ?Past Surgical History:  ?Procedure Laterality Date  ? ABDOMINAL HYSTERECTOMY    ? 1981  ? BACK SURGERY    ? BREAST EXCISIONAL BIOPSY Right 1979  ? Negative  ? COLONOSCOPY W/ POLYPECTOMY    ? COLONOSCOPY WITH PROPOFOL N/A 04/29/2017  ? Procedure: COLONOSCOPY WITH PROPOFOL;  Surgeon: [name], MD;  Location: [facility] END[facility]COPY;  Service: Endoscopy;  Laterality: N/A;  ? JOINT REPLACEMENT    ? bilateral knee  ? left wrist     ? surgery x 2   ?  OPEN REDUCTION INTERNAL FIXATION (ORIF) DISTAL RADIAL FRACTURE Right 06/30/2015  ? Procedure: OPEN REDUCTION INTERNAL FIXATION (ORIF) RIGHT DISTAL RADIUS FRACTURE ;  Surgeon: [name], MD;  Location: [place];  Service: Orthopedics;  Laterality: Right;  ? OTHER SURGICAL HISTORY Right   ? leg - melonoma  ? SHOULDER ARTHROSCOPY WITH OPEN ROTATOR CUFF REPAIR Right 10/02/2017  ? Procedure: Right shoulder mini open rotator cuff repair;  Surgeon: Susa Day, MD;  [facility];  Service: Orthopedics;  Laterality: Right;  90 mins  ? TONSILLECTOMY    ? TOTAL KNEE ARTHROPLASTY  08/11/2011  ? Procedure: TOTAL KNEE ARTHROPLASTY;  Surgeon: [name], MD;  [facility];  Service: Orthopedics;  Laterality: Left;  ? TOTAL KNEE ARTHROPLASTY Left 2010  ? ? ?There were no vitals filed for this visit. ? ? Subjective Assessment - 05/13/21 1353   ? ? Subjective Pt states she is doing well today. Had a nice weekend. She states she felt fine after last session, sore however not unbearable   ? Pertinent History Anita Bailey is an 51yoF who comes to OPPT for  chronic neck stiffness, motor control deficits, and pain s/p fall in July 2022 with posterior head impact, concussion. Pt reports no fracture or bleed identified at the time, but pt had post concussion persistent imbalance and vertigo for which she completed PT at [facility] in March 2023. Pt did not make much progress in cervical complaint at [facility], therapist recommended a clinica that could offer dry needling. 1986 history of fall with posterior neck impact on steps and subsequent orthopedic trauma/rehab.   ? Patient Stated Goals Travel plans for the Summer- Pt would like to improve her cervical ROM and improve her end-range pain.   ? Currently in Pain? No/denies   ? ?  ?  ? ?  ? ? ? ? ? ?INTERVENTIONS ?  ?Manual Therapy  ?Trigger Point Dry Needling (TDN), unbilled. Education performed with patient regarding potential benefit of TDN. Reviewed precautions and risks  with patient. Pt provided verbal consent to treatment. TDN performed with 0.25 x 30 single needle placements with local twitch response (LTR). Pistoning technique utilized. Improved pain-free motion following intervention. Muscles targeted: bilateral upper traps and left cervical paraspinals at level of C6.  ?  ?Trigger point release to left cervical paraspinals and bilateral upper traps ?  ?  ?  ?Therapeutic Exercise ?NuStep for AAROM warm up, seat 9, arms 10 level 4 for 5 minutes  ?  ?Seated cervical stretches, x30 seconds bilaterally: ? -upper trap ? -levator scap ? -cervical extensors  ? ?-Standing wall posture x60 seconds ?-wall angels x12 reps  ?-standing cervical protraction>retraction, x12 reps ? ?  ?  ?  ?  ?Clinical Impression: Pt is pleasant and motivated in session. A small diameter needle was used again during DN as pt seemed to have a better response following last session. DN followed by stretching and gentle cervical strengthening. PT did educate pt on suspected limitations in ROM progress due to advanced OA. Pt asked about medications - PT encouraged pt to guide these questions to her GP whom she will see in 2 weeks. Subjectively, pt feels as though trigger point areas are improving. Pt would benefit from continued PT to address cervical mobility limitations that affect her ability to participate in daily activities with friends and family and driving. ? ? ?  ? ? ? ? ? ? PT Short Term Goals - 04/23/21 1729   ? ?  ? PT SHORT TERM GOAL #1  ? Title Pt to demonstrate improved DNF endurance test >20sec.   ? Baseline Eval: 12 sec   ? Time 3   ? Period Weeks   ? Status New   ? Target Date 05/14/21   ?  ? PT SHORT TERM GOAL #2  ? Title Pt to reports good compliance and understanding of HEP for improving neck strength and postural control.   ? Baseline Issued at eval   ? Time 4   ? Period Weeks   ? Status New   ? Target Date 05/21/21   ? ?  ?  ? ?  ? ? ? ? PT Long Term Goals - 04/23/21 1730   ? ?  ? PT LONG  TERM GOAL #1  ? Title Patient will improve score on FOTO survey >10 points to indicate improved ability to perform ADL/IADL.   ? Baseline Eval: 51   ? Time 6   ? Period Weeks   ? Target Date 06/04/21   ?  ? PT LONG TERM GOAL #2  ? Title Pt will  reports improved return to neutral from end range cervical rotation while driving.   ? Baseline Unable   ? Time 6   ? Period Weeks   ? Status New   ? Target Date 06/04/21   ? ?  ?  ? ?  ? ? ? ? ? ? ? ? Plan - 05/13/21 1501   ? ? Clinical Impression Statement Pt is pleasant and motivated in session. A small diameter needle was used again during DN as pt seemed to have a better response following last session. DN followed by stretching and gentle cervical strengthening. PT did educate pt on suspected limitations in ROM progress due to advanced OA. Pt asked about medications - PT encouraged pt to guide these questions to her GP whom she will see in 2 weeks. Subjectively, pt feels as though trigger point areas are improving. Pt would benefit from continued PT to address cervical mobility limitations that affect her ability to participate in daily activities with friends and family and driving.   ? Personal Factors and Comorbidities Age;Behavior Pattern;Past/Current Experience;Time since onset of injury/illness/exacerbation   ? Examination-Activity Limitations Stand;Reach Overhead;Transfers   ? Examination-Participation Restrictions Cleaning;Driving;Medication Management   ? Stability/Clinical Decision Making Evolving/Moderate complexity   ? Rehab Potential Good   ? PT Frequency 2x / week   ? PT Duration 6 weeks   ? PT Treatment/Interventions ADLs/Self Care Home Management;Moist Heat;Therapeutic exercise;Patient/family education;Manual techniques;Dry needling;Passive range of motion   ? PT Next Visit Plan DN, stretching, cervical/scapular strengthening   ? Consulted and Agree with Plan of Care Patient   ? ?  ?  ? ?  ? ? ?Patient will benefit from skilled therapeutic intervention  in order to improve the following deficits and impairments:  Decreased coordination, Decreased range of motion, Decreased endurance, Decreased activity tolerance, Improper body mechanics ? ?Visit Diagnosi

## 2021-05-14 ENCOUNTER — Encounter: Payer: Medicare Other | Admitting: Physical Therapy

## 2021-05-16 ENCOUNTER — Ambulatory Visit: Payer: Medicare Other | Admitting: Physical Therapy

## 2021-05-20 ENCOUNTER — Encounter: Payer: Self-pay | Admitting: Physical Therapy

## 2021-05-20 ENCOUNTER — Ambulatory Visit: Payer: Medicare Other | Admitting: Physical Therapy

## 2021-05-20 DIAGNOSIS — M6281 Muscle weakness (generalized): Secondary | ICD-10-CM

## 2021-05-20 DIAGNOSIS — M542 Cervicalgia: Secondary | ICD-10-CM | POA: Diagnosis not present

## 2021-05-20 NOTE — Therapy (Signed)
[place] ?[place] [facility] ?[address] ?[place], [place], [address] ?Phone: [phone]   Fax:  [phone] ? ?Physical Therapy Treatment ? ?Patient Details  ?Name: Anita Bailey ?MRN: [id number] ?Date of Birth: [birth year] ?Referring Provider (PT): Rosalia Hammers, DO ? ? ?Encounter Date: 05/20/2021 ? ? PT End of Session - 05/20/21 1451   ? ? Visit Number 5   ? Number of Visits 12   ? Date for PT Re-Evaluation 06/04/21   ? Authorization Type Medicare Primary; AARP Secondary   ? Authorization Time Period 04/23/21-06/04/21   ? Progress Note Due on Visit 10   ? PT Start Time 1350   ? PT Stop Time 1430   ? PT Time Calculation (min) 40 min   ? Activity Tolerance Patient tolerated treatment well;No increased pain;Patient limited by fatigue   ? Behavior During Therapy [facility] for tasks assessed/performed   ? ?  ?  ? ?  ? ? ?Past Medical History:  ?Diagnosis Date  ? Anemia   ? Arthritis   ? Cancer [facility])   ? hx of melanoma in 1971, basal cell   ? Cervical disc disease   ? Chronic kidney disease   ? current uti 08/05/11   ? Depression   ? DVT (deep venous thrombosis) ([place]) 01/2015  ? was Eliquis until 06/13/15-   ? Dysrhythmia   ? hx of heart racing - in past, "none  in years".  had a stress test > 7 years  ? History of recurrent UTIs   ? "bladder infection"  Had a PICC line in past  ? Hyperlipidemia   ? Hypertension   ? Melanoma ([place])   ? Nephrolithiasis   ? Pulmonary emboli ([place])   ? bilateral 8/12 hospitalized   ? Vitamin D deficiency   ? ? ?Past Surgical History:  ?Procedure Laterality Date  ? ABDOMINAL HYSTERECTOMY    ? 1981  ? BACK SURGERY    ? BREAST EXCISIONAL BIOPSY Right 1979  ? Negative  ? COLONOSCOPY W/ POLYPECTOMY    ? COLONOSCOPY WITH PROPOFOL N/A 04/29/2017  ? Procedure: COLONOSCOPY WITH PROPOFOL;  Surgeon: [name], MD;  Location: [facility] ENDOSCOPY;  Service: Endoscopy;  Laterality: N/A;  ? JOINT REPLACEMENT    ? bilateral knee  ? left wrist     ? surgery x 2   ?  OPEN REDUCTION INTERNAL FIXATION (ORIF) DISTAL RADIAL FRACTURE Right 06/30/2015  ? Procedure: OPEN REDUCTION INTERNAL FIXATION (ORIF) RIGHT DISTAL RADIUS FRACTURE ;  Surgeon: [name], MD;  Location: [place];  Service: Orthopedics;  Laterality: Right;  ? OTHER SURGICAL HISTORY Right   ? leg - melonoma  ? SHOULDER ARTHROSCOPY WITH OPEN ROTATOR CUFF REPAIR Right 10/02/2017  ? Procedure: Right shoulder mini open rotator cuff repair;  Surgeon: Susa Day, MD;  Location: [facility]S;  Service: Orthopedics;  Laterality: Right;  90 mins  ? TONSILLECTOMY    ? TOTAL KNEE ARTHROPLASTY  08/11/2011  ? Procedure: TOTAL KNEE ARTHROPLASTY;  Surgeon: [name], MD;  Location: [facility] ORS;  Service: Orthopedics;  Laterality: Left;  ? TOTAL KNEE ARTHROPLASTY Left 2010  ? ? ?There were no vitals filed for this visit. ? ? Subjective Assessment - 05/20/21 1354   ? ? Subjective Pt is sore today after spending the weekend gardening. States her neck and low back hurt worse than the rest of her body however denies pain when PT asks pt to quantify it.   ? Pertinent History  Anita Bailey is an 55yoF who comes to OPPT for chronic neck stiffness, motor control deficits, and pain s/p fall in July 2022 with posterior head impact, concussion. Pt reports no fracture or bleed identified at the time, but pt had post concussion persistent imbalance and vertigo for which she completed PT at [facility] in March 2023. Pt did not make much progress in cervical complaint at [facility], therapist recommended a clinica that could offer dry needling. 1986 history of fall with posterior neck impact on steps and subsequent orthopedic trauma/rehab.   ? Patient Stated Goals Travel plans for the Summer- Pt would like to improve her cervical ROM and improve her end-range pain.   ? Currently in Pain? No/denies   ? ?  ?  ? ?  ? ? ? ? ? ?INTERVENTIONS ?  ?Manual Therapy  ?Trigger Point Dry Needling (TDN), unbilled. Education performed with patient regarding potential  benefit of TDN. Reviewed precautions and risks with patient. Pt provided verbal consent to treatment. TDN performed with 0.25 x 30 single needle placements with local twitch response (LTR). Pistoning technique utilized. Improved pain-free motion following intervention. Muscles targeted: bilateral upper traps and left cervical paraspinals at level of C4 (cervical very sensitive this date - transitioned to [facility] for more comfortable treatment).  ?  ?STM utilizing efflurage, pettrisage and trigger point release to bilateral cervical paraspinals, upper traps and suboccipitals.  ?  ?  ?  ?Therapeutic Exercise ?NuStep for AAROM warm up, seat 9, arms 10 level 4 for 5 minutes  ?  ?Seated cervical stretches, x30 seconds bilaterally: ?            -upper trap ?            -levator scap ?            -cervical extensors  ?  ? ?  ?Clinical Impression: Pt is pleasant in session. She arrives with increased pain and sensitivity in cervical spine after spending all weekend gardening, especially with movement. Today's session focused on pain modulation. A combination of static holds and pistoning techniques were utilized for pt comfort. Cervical region did not tolerate needles well - PT switched to [facility] with better response. Extended time on manual techniques limited therapeutic exercise interventions this date. Pt would benefit from continued PT to address cervical mobility limitations that affect her ability to participate in daily activities with friends and family and driving. ? ? ? ? ? ? ? ? ? PT Short Term Goals - 04/23/21 1729   ? ?  ? PT SHORT TERM GOAL #1  ? Title Pt to demonstrate improved DNF endurance test >20sec.   ? Baseline Eval: 12 sec   ? Time 3   ? Period Weeks   ? Status New   ? Target Date 05/14/21   ?  ? PT SHORT TERM GOAL #2  ? Title Pt to reports good compliance and understanding of HEP for improving neck strength and postural control.   ? Baseline Issued at eval   ? Time 4   ? Period Weeks   ? Status New   ?  Target Date 05/21/21   ? ?  ?  ? ?  ? ? ? ? PT Long Term Goals - 04/23/21 1730   ? ?  ? PT LONG TERM GOAL #1  ? Title Patient will improve score on FOTO survey >10 points to indicate improved ability to perform ADL/IADL.   ? Baseline Eval: 51   ?  Time 6   ? Period Weeks   ? Target Date 06/04/21   ?  ? PT LONG TERM GOAL #2  ? Title Pt will reports improved return to neutral from end range cervical rotation while driving.   ? Baseline Unable   ? Time 6   ? Period Weeks   ? Status New   ? Target Date 06/04/21   ? ?  ?  ? ?  ? ? ? ? ? ? ? ? Plan - 05/20/21 1451   ? ? Clinical Impression Statement Pt is pleasant in session. She arrives with increased pain and sensitivity in cervical spine after spending all weekend gardening, especially with movement. Today's session focused on pain modulation. A combination of static holds and pistoning techniques were utilized for pt comfort. Cervical region did not tolerate needles well - PT switched to [facility] with better response. Extended time on manual techniques limited therapeutic exercise interventions this date. Pt would benefit from continued PT to address cervical mobility limitations that affect her ability to participate in daily activities with friends and family and driving.   ? Personal Factors and Comorbidities Age;Behavior Pattern;Past/Current Experience;Time since onset of injury/illness/exacerbation   ? Examination-Activity Limitations Stand;Reach Overhead;Transfers   ? Examination-Participation Restrictions Cleaning;Driving;Medication Management   ? Stability/Clinical Decision Making Evolving/Moderate complexity   ? Rehab Potential Good   ? PT Frequency 2x / week   ? PT Duration 6 weeks   ? PT Treatment/Interventions ADLs/Self Care Home Management;Moist Heat;Therapeutic exercise;Patient/family education;Manual techniques;Dry needling;Passive range of motion   ? PT Next Visit Plan DN, stretching, cervical/scapular strengthening   ? Consulted and Agree with Plan of Care  Patient   ? ?  ?  ? ?  ? ? ?Patient will benefit from skilled therapeutic intervention in order to improve the following deficits and impairments:  Decreased coordination, Decreased range of motion, Decreased e

## 2021-05-21 ENCOUNTER — Encounter: Payer: Medicare Other | Admitting: Physical Therapy

## 2021-05-23 ENCOUNTER — Other Ambulatory Visit: Payer: Self-pay | Admitting: Specialist

## 2021-05-23 DIAGNOSIS — M545 Low back pain, unspecified: Secondary | ICD-10-CM

## 2021-05-24 ENCOUNTER — Ambulatory Visit: Payer: Medicare Other | Admitting: Physical Therapy

## 2021-05-24 ENCOUNTER — Encounter: Payer: Self-pay | Admitting: Physical Therapy

## 2021-05-24 DIAGNOSIS — M6281 Muscle weakness (generalized): Secondary | ICD-10-CM

## 2021-05-24 DIAGNOSIS — M542 Cervicalgia: Secondary | ICD-10-CM

## 2021-05-24 DIAGNOSIS — R278 Other lack of coordination: Secondary | ICD-10-CM

## 2021-05-24 NOTE — Therapy (Signed)
[place] ?[place] [facility] ?[address] ?[place], [place], [address] ?Phone: [phone]   Fax:  [phone] ? ?Physical Therapy Treatment ? ?Patient Details  ?Name: Anita Bailey ?MRN: [id number] ?Date of Birth: 1934/04/04 ?Referring Provider (PT): Rosalia Hammers, DO ? ? ?Encounter Date: 05/24/2021 ? ? PT End of Session - 05/24/21 1057   ? ? Visit Number 6   ? Number of Visits 12   ? Date for PT Re-Evaluation 06/04/21   ? Authorization Type Medicare Primary; AARP Secondary   ? Authorization Time Period 04/23/21-06/04/21   ? Authorization - Visit Number 6   ? Progress Note Due on Visit 10   ? PT Start Time 3557   ? PT Stop Time 1128   ? PT Time Calculation (min) 39 min   ? Activity Tolerance Patient tolerated treatment well;No increased pain;Patient limited by fatigue   ? Behavior During Therapy [facility] for tasks assessed/performed   ? ?  ?  ? ?  ? ? ?Past Medical History:  ?Diagnosis Date  ? Anemia   ? Arthritis   ? Cancer [facility])   ? hx of melanoma in 1971, basal cell   ? Cervical disc disease   ? Chronic kidney disease   ? current uti 08/05/11   ? Depression   ? DVT (deep venous thrombosis) ([place]) 01/2015  ? was Eliquis until 06/13/15-   ? Dysrhythmia   ? hx of heart racing - in past, "none  in years".  had a stress test > 7 years  ? History of recurrent UTIs   ? "bladder infection"  Had a PICC line in past  ? Hyperlipidemia   ? Hypertension   ? Melanoma ([place])   ? Nephrolithiasis   ? Pulmonary emboli ([place])   ? bilateral 8/12 hospitalized   ? Vitamin D deficiency   ? ? ?Past Surgical History:  ?Procedure Laterality Date  ? ABDOMINAL HYSTERECTOMY    ? 1981  ? BACK SURGERY    ? BREAST EXCISIONAL BIOPSY Right 1979  ? Negative  ? COLONOSCOPY W/ POLYPECTOMY    ? COLONOSCOPY WITH PROPOFOL N/A 04/29/2017  ? Procedure: COLONOSCOPY WITH PROPOFOL;  Surgeon: [name], MD;  Location: [facility] ENDOSCOPY;  Service: Endoscopy;  Laterality: N/A;  ? JOINT REPLACEMENT    ? bilateral knee  ?  left wrist     ? surgery x 2   ? OPEN REDUCTION INTERNAL FIXATION (ORIF) DISTAL RADIAL FRACTURE Right 06/30/2015  ? Procedure: OPEN REDUCTION INTERNAL FIXATION (ORIF) RIGHT DISTAL RADIUS FRACTURE ;  Surgeon: [name], MD;  Location: [place];  Service: Orthopedics;  Laterality: Right;  ? OTHER SURGICAL HISTORY Right   ? leg - melonoma  ? SHOULDER ARTHROSCOPY WITH OPEN ROTATOR CUFF REPAIR Right 10/02/2017  ? Procedure: Right shoulder mini open rotator cuff repair;  Surgeon: Susa Day, MD;  Location: [facility];  Service: Orthopedics;  Laterality: Right;  90 mins  ? TONSILLECTOMY    ? TOTAL KNEE ARTHROPLASTY  08/11/2011  ? Procedure: TOTAL KNEE ARTHROPLASTY;  Surgeon: [name], MD;  Location: [facility];  Service: Orthopedics;  Laterality: Left;  ? TOTAL KNEE ARTHROPLASTY Left 2010  ? ? ?There were no vitals filed for this visit. ? ? Subjective Assessment - 05/24/21 1054   ? ? Subjective Pt arrives at therapy reporting that she has benefitted from therapy from her L rotation, but not on the R. She reports she is only have short term effects from  therapy, and is seeing a spine surgeon in the near future for this. Reports 8/10 pain when rotating her head too far.   ? Pertinent History Anita Bailey is an 39yoF who comes to OPPT for chronic neck stiffness, motor control deficits, and pain s/p fall in July 2022 with posterior head impact, concussion. Pt reports no fracture or bleed identified at the time, but pt had post concussion persistent imbalance and vertigo for which she completed PT at [facility] in March 2023. Pt did not make much progress in cervical complaint at [facility], therapist recommended a clinica that could offer dry needling. 1986 history of fall with posterior neck impact on steps and subsequent orthopedic trauma/rehab.   ? Patient Stated Goals Travel plans for the Summer- Pt would like to improve her cervical ROM and improve her end-range pain.   ? ?  ?  ? ?  ? ?INTERVENTIONS ?  ?Manual Therapy   ?Trigger Point Dry Needling (TDN), unbilled. Education performed with patient regarding potential benefit of TDN. Reviewed precautions and risks with patient. Pt provided verbal consent to treatment. TDN performed with 0.25 x 30 single needle placements with local twitch response (LTR). Pistoning technique utilized. Improved pain-free motion following intervention. Muscles targeted: bilateral upper traps and bilat levator ?  ?STM utilizing efflurage, pettrisage and trigger point release to bilateral cervical paraspinals, upper traps and suboccipitals.  ? ?Cervical traction 10sec on 10sec off x6 ?Maintained traction with alt c rotation x12 supine ?Alt AROM rotation in supine with grade 3 mob ? ?  ?  ?  ?Therapeutic Exercise ?NuStep for AAROM warm up, seat 9, arms 10 level 4 for 5 minutes  ?  ?AROM cervical rotation in supine with pillow case roll at occiput and cuing for bilat shoulder depression with increased motion; pain on L side ? ?Cervical retraction at wall x12; without wall support x12 with min cuing for technique ? ?Education on open-packed position for rotation preventing shoulder hiking and upper cervical ext when attempting rotation with understanding ?  ? ? ? ? ? ? ? ? ? ? ? ? ? ? ? ? ? ? ? ? ? ? ? ? ? ? ? PT Education - 05/24/21 1057   ? ? Education Details therex form/technique   ? Person(s) Educated Patient   ? Methods Explanation;Demonstration;Verbal cues   ? Comprehension Verbalized understanding;Returned demonstration;Verbal cues required   ? ?  ?  ? ?  ? ? ? PT Short Term Goals - 04/23/21 1729   ? ?  ? PT SHORT TERM GOAL #1  ? Title Pt to demonstrate improved DNF endurance test >20sec.   ? Baseline Eval: 12 sec   ? Time 3   ? Period Weeks   ? Status New   ? Target Date 05/14/21   ?  ? PT SHORT TERM GOAL #2  ? Title Pt to reports good compliance and understanding of HEP for improving neck strength and postural control.   ? Baseline Issued at eval   ? Time 4   ? Period Weeks   ? Status New   ?  Target Date 05/21/21   ? ?  ?  ? ?  ? ? ? ? PT Long Term Goals - 04/23/21 1730   ? ?  ? PT LONG TERM GOAL #1  ? Title Patient will improve score on FOTO survey >10 points to indicate improved ability to perform ADL/IADL.   ? Baseline Eval: 51   ? Time 6   ?  Period Weeks   ? Target Date 06/04/21   ?  ? PT LONG TERM GOAL #2  ? Title Pt will reports improved return to neutral from end range cervical rotation while driving.   ? Baseline Unable   ? Time 6   ? Period Weeks   ? Status New   ? Target Date 06/04/21   ? ?  ?  ? ?  ? ? ? ? ? ? ? ? Plan - 05/24/21 1130   ? ? Clinical Impression Statement PT continued to utilize manual techniques to decrease soft tissue tension for increased mobility with success. Patient with LTR and palpable decreased tension following manual techniques. Patient is able to demonstrate good carry over of therex and understanding of postural differences that contribute to increased mobility as well. Pt with marked increase in bilat cervical rotation at end of session L>R, with continued pain with R rotation. PT will continue progression as able.   ? Personal Factors and Comorbidities Age;Behavior Pattern;Past/Current Experience;Time since onset of injury/illness/exacerbation   ? Examination-Activity Limitations Stand;Reach Overhead;Transfers   ? Examination-Participation Restrictions Cleaning;Driving;Medication Management   ? Stability/Clinical Decision Making Evolving/Moderate complexity   ? Clinical Decision Making Moderate   ? Rehab Potential Good   ? PT Frequency 2x / week   ? PT Duration 6 weeks   ? PT Treatment/Interventions ADLs/Self Care Home Management;Moist Heat;Therapeutic exercise;Patient/family education;Manual techniques;Dry needling;Passive range of motion   ? PT Next Visit Plan DN, stretching, cervical/scapular strengthening   ? PT Home Exercise Plan Access Code: [email or web address]  URL: [email or web address]  Date: 04/23/2021  Prepared by: Rebbeca Paul    Exercises  -  Supine Cervical Retraction with Towel  - 3 x daily - 7 x weekly - 1 sets - 15 reps - 3 hold  - Supine Deep Neck Flexor Training - Repetitions  - 3 x daily - 7 x weekly - 1 sets - 10 reps - 5 hold   ? Consulted an

## 2021-05-27 ENCOUNTER — Ambulatory Visit: Payer: Medicare Other | Admitting: Physical Therapy

## 2021-05-27 ENCOUNTER — Encounter: Payer: Self-pay | Admitting: Physical Therapy

## 2021-05-27 DIAGNOSIS — M542 Cervicalgia: Secondary | ICD-10-CM | POA: Diagnosis not present

## 2021-05-27 DIAGNOSIS — M6281 Muscle weakness (generalized): Secondary | ICD-10-CM

## 2021-05-27 NOTE — Therapy (Signed)
[facility] ?[address] ?[place], [place], [address] ?Phone: [phone]   Fax:  [phone] ? ?Physical Therapy Treatment ? ?Patient Details  ?Name: Anita Bailey ?MRN: [id number] ?Date of Birth: Jun 01, 1934 ?Referring Provider (PT): Rosalia Hammers, DO ? ? ?Encounter Date: 05/27/2021 ? ? PT End of Session - 05/27/21 1057   ? ? Visit Number 7   ? Number of Visits 12   ? Date for PT Re-Evaluation 06/04/21   ? Authorization Type Medicare Primary; AARP Secondary   ? Authorization Time Period 04/23/21-06/04/21   ? Authorization - Visit Number 7   ? Progress Note Due on Visit 10   ? PT Start Time 1052   ? PT Stop Time 1130   ? PT Time Calculation (min) 38 min   ? Activity Tolerance Patient tolerated treatment well;No increased pain   ? Behavior During Therapy [facility] for tasks assessed/performed   ? ?  ?  ? ?  ? ? ?Past Medical History:  ?Diagnosis Date  ? Anemia   ? Arthritis   ? Cancer [facility])   ? hx of melanoma in 1971, basal cell   ? Cervical disc disease   ? Chronic kidney disease   ? current uti 08/05/11   ? Depression   ? DVT (deep venous thrombosis) ([place]) 01/2015  ? was Eliquis until 06/13/15-   ? Dysrhythmia   ? hx of heart racing - in past, "none  in years".  had a stress test > 7 years  ? History of recurrent UTIs   ? "bladder infection"  Had a PICC line in past  ? Hyperlipidemia   ? Hypertension   ? Melanoma ([place])   ? Nephrolithiasis   ? Pulmonary emboli ([place])   ? bilateral 8/12 hospitalized   ? Vitamin D deficiency   ? ? ?Past Surgical History:  ?Procedure Laterality Date  ? ABDOMINAL HYSTERECTOMY    ? 1981  ? BACK SURGERY    ? BREAST EXCISIONAL BIOPSY Right 1979  ? Negative  ? COLONOSCOPY W/ POLYPECTOMY    ? COLONOSCOPY WITH PROPOFOL N/A 04/29/2017  ? Procedure: COLONOSCOPY WITH PROPOFOL;  Surgeon: [name], MD;  Location: [facility] ENDOSCOPY;  Service: Endoscopy;  Laterality: N/A;  ? JOINT REPLACEMENT    ? bilateral knee  ? left wrist     ? surgery x  2   ? OPEN REDUCTION INTERNAL FIXATION (ORIF) DISTAL RADIAL FRACTURE Right 06/30/2015  ? Procedure: OPEN REDUCTION INTERNAL FIXATION (ORIF) RIGHT DISTAL RADIUS FRACTURE ;  Surgeon: [name], MD;  Location: [facility];  Service: Orthopedics;  Laterality: Right;  ? OTHER SURGICAL HISTORY Right   ? leg - melonoma  ? SHOULDER ARTHROSCOPY WITH OPEN ROTATOR CUFF REPAIR Right 10/02/2017  ? Procedure: Right shoulder mini open rotator cuff repair;  Surgeon: Susa Day, MD;  Location: [facility] ORS;  Service: Orthopedics;  Laterality: Right;  90 mins  ? TONSILLECTOMY    ? TOTAL KNEE ARTHROPLASTY  08/11/2011  ? Procedure: TOTAL KNEE ARTHROPLASTY;  Surgeon: [name], MD;  Location: [facility] ORS;  Service: Orthopedics;  Laterality: Left;  ? TOTAL KNEE ARTHROPLASTY Left 2010  ? ? ?There were no vitals filed for this visit. ? ? Subjective Assessment - 05/27/21 1055   ? ? Subjective Pt reports she is having the best day for pain she has had in a long time. She had good relief after last session and has the most flexibility since that she has  had. Maintains 5/10 pain on R side of the neck. Compliance with HEP.   ? Pertinent History Anita Bailey is an 37yoF who comes to OPPT for chronic neck stiffness, motor control deficits, and pain s/p fall in July 2022 with posterior head impact, concussion. Pt reports no fracture or bleed identified at the time, but pt had post concussion persistent imbalance and vertigo for which she completed PT at [facility] in March 2023. Pt did not make much progress in cervical complaint at [facility], therapist recommended a clinica that could offer dry needling. 1986 history of fall with posterior neck impact on steps and subsequent orthopedic trauma/rehab.   ? Patient Stated Goals Travel plans for the Summer- Pt would like to improve her cervical ROM and improve her end-range pain.   ? ?  ?  ? ?  ? ? ? ? ?Manual Therapy  ?Trigger Point Dry Needling (TDN), unbilled. Education performed with patient regarding  potential benefit of TDN. Reviewed precautions and risks with patient. Pt provided verbal consent to treatment. TDN performed with 0.25 x 30 single needle placements with local twitch response (LTR). Pistoning technique utilized. Improved pain-free motion following intervention. Muscles targeted: bilateral upper traps and bilat levator ?  ?STM utilizing efflurage, pettrisage and trigger point release to bilateral cervical paraspinals, upper traps and suboccipitals.  ?  ?Cervical traction 10sec on 10sec off x6 ?Maintained traction with alt c rotation x12 supine ?Alt AROM rotation in supine with grade 3 mob ?  ?  ?  ?  ?Therapeutic Exercise ?NuStep for AAROM warm up, seat 9, arms 10 level 4 for 5 minutes  ?  ?AROM cervical rotation in supine with pillow case roll at occiput and cuing for bilat shoulder depression with increased motion; pain on L side ?  ?Horizontal abd RTB standing against wall with TC for scapular and cervical retraction 2x 10 ?  ?Post shoulder rolls with focus on retraction + depression x15 ? ? ? ? ? ? ? ? ? ? ? ? ? ? ? ? ? ? ? ? ? ? ? ? PT Education - 05/27/21 1057   ? ? Education Details therex form/technique   ? Person(s) Educated Patient   ? Methods Explanation;Demonstration;Verbal cues   ? Comprehension Verbalized understanding;Returned demonstration;Verbal cues required   ? ?  ?  ? ?  ? ? ? PT Short Term Goals - 04/23/21 1729   ? ?  ? PT SHORT TERM GOAL #1  ? Title Pt to demonstrate improved DNF endurance test >20sec.   ? Baseline Eval: 12 sec   ? Time 3   ? Period Weeks   ? Status New   ? Target Date 05/14/21   ?  ? PT SHORT TERM GOAL #2  ? Title Pt to reports good compliance and understanding of HEP for improving neck strength and postural control.   ? Baseline Issued at eval   ? Time 4   ? Period Weeks   ? Status New   ? Target Date 05/21/21   ? ?  ?  ? ?  ? ? ? ? PT Long Term Goals - 04/23/21 1730   ? ?  ? PT LONG TERM GOAL #1  ? Title Patient will improve score on FOTO survey >10 points  to indicate improved ability to perform ADL/IADL.   ? Baseline Eval: 51   ? Time 6   ? Period Weeks   ? Target Date 06/04/21   ?  ? PT  LONG TERM GOAL #2  ? Title Pt will reports improved return to neutral from end range cervical rotation while driving.   ? Baseline Unable   ? Time 6   ? Period Weeks   ? Status New   ? Target Date 06/04/21   ? ?  ?  ? ?  ? ? ? ? ? ? ? ? Plan - 05/27/21 1127   ? ? Clinical Impression Statement Pt with best progress in PT episode made followign last session, demonstrating decreased soft tissue tension at bilat UT, continued LTR to Tdn of bilat levator scapulae, and increaesd rotation (full R rotation, L continues 50% limited). PT continued manual and therex for increased mobility. Pt is able to comply with all cuing for proper technique of therex with good motivation throughout session. PT will continue progression as able.   ? Personal Factors and Comorbidities Age;Behavior Pattern;Past/Current Experience;Time since onset of injury/illness/exacerbation   ? Examination-Activity Limitations Stand;Reach Overhead;Transfers   ? Examination-Participation Restrictions Cleaning;Driving;Medication Management   ? Stability/Clinical Decision Making Evolving/Moderate complexity   ? Clinical Decision Making Moderate   ? Rehab Potential Good   ? PT Duration 6 weeks   ? PT Treatment/Interventions ADLs/Self Care Home Management;Moist Heat;Therapeutic exercise;Patient/family education;Manual techniques;Dry needling;Passive range of motion   ? PT Next Visit Plan DN, stretching, cervical/scapular strengthening   ? PT Home Exercise Plan Access Code: [email or web address]  URL: [email or web address]  Date: 04/23/2021  Prepared by: Rebbeca Paul    Exercises  - Supine Cervical Retraction with Towel  - 3 x daily - 7 x weekly - 1 sets - 15 reps - 3 hold  - Supine Deep Neck Flexor Training - Repetitions  - 3 x daily - 7 x weekly - 1 sets - 10 reps - 5 hold   ? Consulted and Agree with Plan of Care Patient    ? ?  ?  ? ?  ? ? ?Patient will benefit from skilled therapeutic intervention in order to improve the following deficits and impairments:  Decreased coordination, Decreased range of motion, Decreased end

## 2021-05-30 ENCOUNTER — Ambulatory Visit: Payer: Medicare Other | Admitting: Physical Therapy

## 2021-05-30 ENCOUNTER — Ambulatory Visit
Admission: RE | Admit: 2021-05-30 | Discharge: 2021-05-30 | Disposition: A | Discharge status: Home / Self Care | Payer: Medicare Other | Source: Ambulatory Visit | Attending: Specialist | Admitting: Specialist

## 2021-05-30 ENCOUNTER — Encounter: Payer: Self-pay | Admitting: Physical Therapy

## 2021-05-30 DIAGNOSIS — M542 Cervicalgia: Secondary | ICD-10-CM

## 2021-05-30 NOTE — Discharge Instructions (Signed)

## 2021-05-30 NOTE — Therapy (Signed)
[facility] [address] [place], [address] Phone: [phone]   Fax:  [phone]  Physical Therapy Treatment  Patient Details  Name: Anita Bailey MRN: [id number] Date of Birth: [birth year] Referring Provider (PT): Rosalia Hammers, DO   Encounter Date: 05/30/2021   PT End of Session - 05/30/21 1112     Visit Number 8    Number of Visits 12    Date for PT Re-Evaluation 06/04/21    Authorization Type Medicare Primary; AARP Secondary    Authorization Time Period 04/23/21-06/04/21    Authorization - Visit Number 8    Progress Note Due on Visit 10    PT Start Time 5449    PT Stop Time 1125    PT Time Calculation (min) 38 min    Activity Tolerance Patient tolerated treatment well;No increased pain    Behavior During Therapy WFL for tasks assessed/performed             Past Medical History:  Diagnosis Date   Anemia    Arthritis    Cancer ([place])    hx of melanoma in 1971, basal cell    Cervical disc disease    Chronic kidney disease    current uti 08/05/11    Depression    DVT (deep venous thrombosis) ([place]) 01/2015   was Eliquis until 06/13/15-    Dysrhythmia    hx of heart racing - in past, "none  in years".  had a stress test > 7 years   History of recurrent UTIs    "bladder infection"  Had a PICC line in past   Hyperlipidemia    Hypertension    Melanoma ([place])    Nephrolithiasis    Pulmonary emboli ([place])    bilateral 8/12 hospitalized    Vitamin D deficiency     Past Surgical History:  Procedure Laterality Date   ABDOMINAL HYSTERECTOMY     1981   BACK SURGERY     BREAST EXCISIONAL BIOPSY Right 1979   Negative   COLONOSCOPY W/ POLYPECTOMY     COLONOSCOPY WITH PROPOFOL N/A 04/29/2017   Procedure: COLONOSCOPY WITH PROPOFOL;  Surgeon: [name], MD;  Location[facility] ENDOSCOPY;  Service: Endoscopy;  Laterality: N/A;   JOINT REPLACEMENT     bilateral knee   left wrist      surgery x  2    OPEN REDUCTION INTERNAL FIXATION (ORIF) DISTAL RADIAL FRACTURE Right 06/30/2015   Procedure: OPEN REDUCTION INTERNAL FIXATION (ORIF) RIGHT DISTAL RADIUS FRACTURE ;  Surgeon: [name], MD;  Location: [facility];  Service: Orthopedics;  Laterality: Right;   OTHER SURGICAL HISTORY Right    leg - melonoma   SHOULDER ARTHROSCOPY WITH OPEN ROTATOR CUFF REPAIR Right 10/02/2017   Procedure: Right shoulder mini open rotator cuff repair;  Surgeon: Susa Day, MD;  [facility];  Service: Orthopedics;  Laterality: Right;  90 mins   TONSILLECTOMY     TOTAL KNEE ARTHROPLASTY  08/11/2011   Procedure: TOTAL KNEE ARTHROPLASTY;  Surgeon: [name], MD;  [facility]: [facility];  Service: Orthopedics;  Laterality: Left;   TOTAL KNEE ARTHROPLASTY Left 2010    There were no vitals filed for this visit.   Subjective Assessment - 05/30/21 1056     Subjective Reports her neck is still feeling better overall. More difficulty with L rotation than R. Reports she is scheduled for a nerve block. Compliance with HEP . 5/10 pain today.  Pertinent History Anita Bailey is an 67yoF who comes to OPPT for chronic neck stiffness, motor control deficits, and pain s/p fall in July 2022 with posterior head impact, concussion. Pt reports no fracture or bleed identified at the time, but pt had post concussion persistent imbalance and vertigo for which she completed PT at [facility] in March 2023. Pt did not make much progress in cervical complaint at [facility], therapist recommended a clinica that could offer dry needling. 1986 history of fall with posterior neck impact on steps and subsequent orthopedic trauma/rehab.    Patient Stated Goals Travel plans for the Summer- Pt would like to improve her cervical ROM and improve her end-range pain.               Manual Therapy  Trigger Point Dry Needling (TDN), unbilled. Education performed with patient regarding potential benefit of TDN. Reviewed precautions and risks  with patient. Pt provided verbal consent to treatment. TDN performed with 0.25 x 30 single needle placements with local twitch response (LTR). Pistoning technique utilized. Improved pain-free motion following intervention. Muscles targeted: bilateral upper traps and bilat levator   STM utilizing efflurage, pettrisage and trigger point release to bilateral cervical paraspinals, upper traps and suboccipitals.    Cervical traction 10sec on 10sec off x6 Maintained traction with alt c rotation x12 supine Alt AROM rotation in supine with grade 3 mob     Therapeutic Exercise NuStep for AAROM warm up, seat 9, arms 10 level 4 for 5 minutes    AROM cervical rotation x12 with full rotation in supine In standing with occiput against towel x12 without x12 with near full ROM, pain with L rotation   Post shoulder rolls with focus on retraction + depression x15                           PT Education - 05/30/21 1059     Education Details therex form/technique    Person(s) Educated Patient    Methods Explanation;Demonstration;Verbal cues    Comprehension Verbalized understanding;Returned demonstration              PT Short Term Goals - 04/23/21 1729       PT SHORT TERM GOAL #1   Title Pt to demonstrate improved DNF endurance test >20sec.    Baseline Eval: 12 sec    Time 3    Period Weeks    Status New    Target Date 05/14/21      PT SHORT TERM GOAL #2   Title Pt to reports good compliance and understanding of HEP for improving neck strength and postural control.    Baseline Issued at eval    Time 4    Period Weeks    Status New    Target Date 05/21/21               PT Long Term Goals - 05/30/21 1128       PT LONG TERM GOAL #1   Title Patient will improve score on FOTO survey >10 points to indicate improved ability to perform ADL/IADL.    Baseline Eval: 51; 05/30/21 55    Time 6    Period Weeks    Status On-going      PT LONG TERM GOAL #2   Title  Pt will reports improved return to neutral from end range cervical rotation while driving.    Baseline Unable    Time 6  Period Weeks    Status New      PT LONG TERM GOAL #3   Title Patient will be able to perform a sit to stand transfer without increase in pain to indicate improvement in pain and muscular guarding    Baseline Still having trouble from lower depths and without armrests    Time 6    Period Weeks    Status On-going                   Plan - 05/30/21 1124     Clinical Impression Statement PT continued manual and therex for increased mobility. Pt completed FOTO survey showing improvement, and subjectively reports feeling 90% better overall. PT discussed forgoing TDN next week to determine maintenance of progress without passive intervention for probable d/c after next week, patient in agreeance. Patient is motivated with good carry over of cuing for proper exercise technique. PT will continue progression as able.    Personal Factors and Comorbidities Age;Behavior Pattern;Past/Current Experience;Time since onset of injury/illness/exacerbation    Examination-Activity Limitations Stand;Reach Overhead;Transfers    Examination-Participation Restrictions Cleaning;Driving;Medication Management    Stability/Clinical Decision Making Evolving/Moderate complexity    Clinical Decision Making Moderate    Rehab Potential Good    PT Frequency 2x / week    PT Duration 6 weeks    PT Treatment/Interventions ADLs/Self Care Home Management;Moist Heat;Therapeutic exercise;Patient/family education;Manual techniques;Dry needling;Passive range of motion    PT Next Visit Plan DN, stretching, cervical/scapular strengthening    PT Home Exercise Plan Access Code: [email or web address]  URL: [email or web address]  Date: 04/23/2021  Prepared by: Rebbeca Paul    Exercises  - Supine Cervical Retraction with Towel  - 3 x daily - 7 x weekly - 1 sets - 15 reps - 3 hold  - Supine Deep Neck Flexor  Training - Repetitions  - 3 x daily - 7 x weekly - 1 sets - 10 reps - 5 hold    Consulted and Agree with Plan of Care Patient             Patient will benefit from skilled therapeutic intervention in order to improve the following deficits and impairments:  Decreased coordination, Decreased range of motion, Decreased endurance, Decreased activity tolerance, Improper body mechanics  Visit Diagnosis: Cervicalgia     Problem List Patient Active Problem List   Diagnosis Date Noted   Overactive bladder 12/23/2018   PAT (paroxysmal atrial tachycardia) ([place]) 09/30/2018   Adult idiopathic generalized osteoporosis 06/04/2018   OSA on CPAP 03/29/2018   Recurrent UTI 12/27/2017   Renal cyst 12/27/2017   Rotator cuff tear, right 10/02/2017   Complete rotator cuff tear 10/02/2017   Tubular adenoma 05/27/2017   Spondylolisthesis 05/13/2017   Spinal stenosis of lumbar region 05/13/2017   Neuritis of saphenous nerve 05/13/2017   Lumbar radiculopathy 05/13/2017   Essential hypertension 07/31/2016   Renal artery stenosis ([facility]) 07/31/2016   DJD (degenerative joint disease) 07/31/2016   Low serum vitamin D 07/09/2015   Distal radius fracture, right 06/30/2015   Hyperlipidemia, mixed 04/10/2015   History of malignant melanoma 02/13/2014   Pulmonary emboli ([place]) 06/17/2013   Pernicious anemia 06/17/2013   Lumbar disc disease 06/17/2013   Cervical disc disease 06/17/2013   Basal cell carcinoma of right cheek 04/28/2013   S/P left TKA 08/11/2011   Anita Bailey DPT Anita Bailey, PT 05/30/2021, 11:29 AM  [facility] [address], [place], [address] Phone: [phone]  Fax:  [phone]  Name: Anita Bailey MRN: [id number] Date of Birth: [birth year]

## 2021-06-05 ENCOUNTER — Encounter: Payer: Self-pay | Admitting: Physical Therapy

## 2021-06-05 ENCOUNTER — Ambulatory Visit: Payer: Medicare Other

## 2021-06-05 DIAGNOSIS — M542 Cervicalgia: Secondary | ICD-10-CM

## 2021-06-05 DIAGNOSIS — M6281 Muscle weakness (generalized): Secondary | ICD-10-CM

## 2021-06-05 DIAGNOSIS — R278 Other lack of coordination: Secondary | ICD-10-CM

## 2021-06-05 NOTE — Therapy (Signed)
[facility] [address] [place], [address] Phone: [phone]   Fax:  [phone]  Physical Therapy Treatment/RECERT  Patient Details  Name: Anita Bailey MRN: [id number] Date of Birth: 07/11/1934 Referring Provider (PT): Rosalia Hammers, DO   Encounter Date: 06/05/2021   PT End of Session - 06/05/21 1015     Visit Number 9    Number of Visits 12    Date for PT Re-Evaluation 07/17/21    Authorization Type Medicare Primary; AARP Secondary    Authorization Time Period 06/05/21 - 07/17/21    Authorization - Visit Number 9    Progress Note Due on Visit 10    PT Start Time 1010    PT Stop Time 1046    PT Time Calculation (min) 36 min    Activity Tolerance Patient tolerated treatment well;No increased pain    Behavior During Therapy WFL for tasks assessed/performed             Past Medical History:  Diagnosis Date   Anemia    Arthritis    Cancer ([place])    hx of melanoma in 1971, basal cell    Cervical disc disease    Chronic kidney disease    current uti 08/05/11    Depression    DVT (deep venous thrombosis) ([place]) 01/2015   was Eliquis until 06/13/15-    Dysrhythmia    hx of heart racing - in past, "none  in years".  had a stress test > 7 years   History of recurrent UTIs    "bladder infection"  Had a PICC line in past   Hyperlipidemia    Hypertension    Melanoma ([place])    Nephrolithiasis    Pulmonary emboli ([facility])    bilateral 8/12 hospitalized    Vitamin D deficiency     Past Surgical History:  Procedure Laterality Date   ABDOMINAL HYSTERECTOMY     1981   BACK SURGERY     BREAST EXCISIONAL BIOPSY Right 1979   Negative   COLONOSCOPY W/ POLYPECTOMY     COLONOSCOPY WITH PROPOFOL N/A 04/29/2017   Procedure: COLONOSCOPY WITH PROPOFOL;  Surgeon: [name], MD;  Location: [facility] ENDOSCOPY;  Service: Endoscopy;  Laterality: N/A;   JOINT REPLACEMENT     bilateral knee   left wrist       surgery x 2    OPEN REDUCTION INTERNAL FIXATION (ORIF) DISTAL RADIAL FRACTURE Right 06/30/2015   Procedure: OPEN REDUCTION INTERNAL FIXATION (ORIF) RIGHT DISTAL RADIUS FRACTURE ;  Surgeon: [name], MD;  Location: [facility];  Service: Orthopedics;  Laterality: Right;   OTHER SURGICAL HISTORY Right    leg - melonoma   SHOULDER ARTHROSCOPY WITH OPEN ROTATOR CUFF REPAIR Right 10/02/2017   Procedure: Right shoulder mini open rotator cuff repair;  Surgeon: Susa Day, MD;  Location: [facility];  Service: Orthopedics;  Laterality: Right;  90 mins   TONSILLECTOMY     TOTAL KNEE ARTHROPLASTY  08/11/2011   Procedure: TOTAL KNEE ARTHROPLASTY;  Surgeon: [name], MD;  Location: [facility];  Service: Orthopedics;  Laterality: Left;   TOTAL KNEE ARTHROPLASTY Left 2010    There were no vitals filed for this visit.   Subjective Assessment - 06/05/21 1013     Subjective Continues to report improvement in her neck pain. Happy with her motion. Reports getting nerve block scheduled soon. Reports being busy with shoulder issues and R hand issues. Pt reports neck  pain with cervical motion at a 1.5/10 NPS.    Pertinent History [name] is an 27yoF who comes to OPPT for chronic neck stiffness, motor control deficits, and pain s/p fall in July 2022 with posterior head impact, concussion. Pt reports no fracture or bleed identified at the time, but pt had post concussion persistent imbalance and vertigo for which she completed PT at [facility] in March 2023. Pt did not make much progress in cervical complaint at [facility], therapist recommended a clinica that could offer dry needling. 1986 history of fall with posterior neck impact on steps and subsequent orthopedic trauma/rehab.    Patient Stated Goals Travel plans for the Summer- Pt would like to improve her cervical ROM and improve her end-range pain.    Currently in Pain? Yes    Pain Score --   1.5   Pain Location Neck              There.ex:    Re-assessment of goals. Pt completed and accomplished all short term goals. Pt making progress towards long term goals. No formal measurements of cervical AROM documented. Thus completed this date. New goal written for improving cervical AROM as remains limited mostly in rotation and lateral flexion. Please see goals section for details.   Seated SNAGS with towel:   Cervical extension: x15 with PT demo. Good carryover after demo  Cervical rotation: x10/direction. PT demo and min multimodal cuing for form/technique.     PT Short Term Goals - 06/05/21 1016       PT SHORT TERM GOAL #1   Title Pt to demonstrate improved DNF endurance test >20sec.    Baseline Eval: 12 sec; 06/05/21: 47.13 sec    Time 3    Period Weeks    Status Achieved    Target Date 06/05/21      PT SHORT TERM GOAL #2   Title Pt to reports good compliance and understanding of HEP for improving neck strength and postural control.    Baseline Issued at eval; 06/05/21: Indep completion with understanding and compliance.    Time 4    Period Weeks    Status Achieved    Target Date 06/05/21               PT Long Term Goals - 06/05/21 1021       PT LONG TERM GOAL #1   Title Patient will improve score on FOTO survey >10 points to indicate improved ability to perform ADL/IADL.    Baseline Eval: 51; 05/30/21 55    Time 6    Period Weeks    Status On-going    Target Date 07/17/21      PT LONG TERM GOAL #2   Title Pt will reports improved return to neutral from end range cervical rotation while driving.    Baseline Unable;    Time 6    Period Weeks    Status New    Target Date 07/17/21      PT LONG TERM GOAL #3   Title Patient will be able to perform a sit to stand transfer without increase in pain to indicate improvement in pain and muscular guarding    Baseline Still having trouble from lower depths and without armrests    Time 6    Period Weeks    Status On-going    Target Date 07/17/21      PT LONG  TERM GOAL #4   Title Pt will improve global cervical AROM  in all planes > 5 deg for heading turning ADL's such as driving.    Baseline 06/05/21: 40 deg rotation R/L end range pain   Flexion: 60 deg  Extension: 50 deg with end range pain  R/L lateral felxion: 30 deg R, 25 deg to L    Time 6    Period Weeks    Status New    Target Date 07/17/21                   Plan - 06/05/21 1043     Clinical Impression Statement Pt at end of current PT POC. Pt accomplishing all short term goals with progress towards long term FOTO goal. Remains having dififculty with STS and rotation. Formal cervical AROM measurements taken. Pt remains most limited in rotation bilaterally and lateral flexion. Additional goal written for improving cervical AROM as rotation remains significantly limited especially for driving tasks. PT plan additional 6 weeks to make additional progress towards long term goals prior to d/c.    Personal Factors and Comorbidities Age;Behavior Pattern;Past/Current Experience;Time since onset of injury/illness/exacerbation    Examination-Activity Limitations Stand;Reach Overhead;Transfers    Examination-Participation Restrictions Cleaning;Driving;Medication Management    Stability/Clinical Decision Making Evolving/Moderate complexity    Clinical Decision Making Moderate    Rehab Potential Good    PT Frequency 2x / week    PT Duration 6 weeks    PT Treatment/Interventions ADLs/Self Care Home Management;Moist Heat;Therapeutic exercise;Patient/family education;Manual techniques;Dry needling;Passive range of motion    PT Next Visit Plan DN, stretching, cervical/scapular strengthening    PT Home Exercise Plan Access Code: [email or web address]  URL: [email or web address]  Date: 04/23/2021  Prepared by: Rebbeca Paul    Exercises  - Supine Cervical Retraction with Towel  - 3 x daily - 7 x weekly - 1 sets - 15 reps - 3 hold  - Supine Deep Neck Flexor Training - Repetitions  - 3 x daily - 7 x  weekly - 1 sets - 10 reps - 5 hold    Consulted and Agree with Plan of Care Patient             Patient will benefit from skilled therapeutic intervention in order to improve the following deficits and impairments:  Decreased coordination, Decreased range of motion, Decreased endurance, Decreased activity tolerance, Improper body mechanics  Visit Diagnosis: Cervicalgia  Muscle weakness (generalized)  Other lack of coordination     Problem List Patient Active Problem List   Diagnosis Date Noted   Overactive bladder 12/23/2018   PAT (paroxysmal atrial tachycardia) ([place]) 09/30/2018   Adult idiopathic generalized osteoporosis 06/04/2018   OSA on CPAP 03/29/2018   Recurrent UTI 12/27/2017   Renal cyst 12/27/2017   Rotator cuff tear, right 10/02/2017   Complete rotator cuff tear 10/02/2017   Tubular adenoma 05/27/2017   Spondylolisthesis 05/13/2017   Spinal stenosis of lumbar region 05/13/2017   Neuritis of saphenous nerve 05/13/2017   Lumbar radiculopathy 05/13/2017   Essential hypertension 07/31/2016   Renal artery stenosis ([facility]) 07/31/2016   DJD (degenerative joint disease) 07/31/2016   Low serum vitamin D 07/09/2015   Distal radius fracture, right 06/30/2015   Hyperlipidemia, mixed 04/10/2015   History of malignant melanoma 02/13/2014   Pulmonary emboli ([place]) 06/17/2013   Pernicious anemia 06/17/2013   Lumbar disc disease 06/17/2013   Cervical disc disease 06/17/2013   Basal cell carcinoma of right cheek 04/28/2013   S/P left TKA 08/11/2011    Salem Caster. Fairly IV, PT, DPT Physical Therapist- [place]  [facility]  06/05/2021, 12:27 PM  [facility] [address] [place], [address] Phone: [phone]   Fax:  [phone]  Name: RUMI KOLODZIEJ MRN: [id number] Date of Birth: 1934-08-01

## 2021-06-06 ENCOUNTER — Ambulatory Visit
Admission: RE | Admit: 2021-06-06 | Discharge: 2021-06-06 | Disposition: A | Discharge status: Home / Self Care | Payer: Medicare Other | Source: Ambulatory Visit | Attending: Specialist | Admitting: Specialist

## 2021-06-06 DIAGNOSIS — M545 Low back pain, unspecified: Secondary | ICD-10-CM

## 2021-06-06 MED ORDER — METHYLPREDNISOLONE ACETATE 40 MG/ML INJ SUSP (RADIOLOG
80.0000 mg | Freq: Once | INTRAMUSCULAR | Status: AC
Start: 2021-06-06 — End: 2021-06-06
  Administered 2021-06-06: 80 mg via EPIDURAL

## 2021-06-06 MED ORDER — IOPAMIDOL (ISOVUE-M 200) INJECTION 41%
1.0000 mL | Freq: Once | INTRAMUSCULAR | Status: AC
  Administered 2021-06-06: 1 mL via EPIDURAL

## 2021-06-06 NOTE — Discharge Instructions (Signed)

## 2021-06-07 ENCOUNTER — Encounter: Payer: Self-pay | Admitting: Physical Therapy

## 2021-06-07 ENCOUNTER — Ambulatory Visit: Payer: Medicare Other

## 2021-06-07 DIAGNOSIS — M542 Cervicalgia: Secondary | ICD-10-CM

## 2021-06-07 DIAGNOSIS — M6281 Muscle weakness (generalized): Secondary | ICD-10-CM

## 2021-06-07 NOTE — Therapy (Signed)
[facility] [address], [place], [address] Phone: [phone]   Fax:  [phone]  Physical Therapy Treatment/Physical Therapy Progress Note  Dates of reporting period  04/23/21   to   06/07/21  Patient Details  Name: Anita Bailey MRN: [id number] Date of Birth: 05/21/1934 Referring Provider (PT): Rosalia Hammers, DO   Encounter Date: 06/07/2021   PT End of Session - 06/07/21 0934     Visit Number 10    Number of Visits 12    Date for PT Re-Evaluation 07/17/21    Authorization Type Medicare Primary; AARP Secondary    Authorization Time Period 06/05/21 - 07/17/21    Authorization - Visit Number 10    Progress Note Due on Visit 10    PT Start Time 0917    PT Stop Time 1000    PT Time Calculation (min) 43 min    Activity Tolerance Patient tolerated treatment well;No increased pain    Behavior During Therapy WFL for tasks assessed/performed             Past Medical History:  Diagnosis Date   Anemia    Arthritis    Cancer ([place])    hx of melanoma in 1971, basal cell    Cervical disc disease    Chronic kidney disease    current uti 08/05/11    Depression    DVT (deep venous thrombosis) ([place]) 01/2015   was Eliquis until 06/13/15-    Dysrhythmia    hx of heart racing - in past, "none  in years".  had a stress test > 7 years   History of recurrent UTIs    "bladder infection"  Had a PICC line in past   Hyperlipidemia    Hypertension    Melanoma ([place])    Nephrolithiasis    Pulmonary emboli ([place])    bilateral 8/12 hospitalized    Vitamin D deficiency     Past Surgical History:  Procedure Laterality Date   ABDOMINAL HYSTERECTOMY     1981   BACK SURGERY     BREAST EXCISIONAL BIOPSY Right 1979   Negative   COLONOSCOPY W/ POLYPECTOMY     COLONOSCOPY WITH PROPOFOL N/A 04/29/2017   Procedure: COLONOSCOPY WITH PROPOFOL;  Surgeon: [name], MD;  Location: [facility] ENDOSCOPY;  Service: Endoscopy;   Laterality: N/A;   JOINT REPLACEMENT     bilateral knee   left wrist      surgery x 2    OPEN REDUCTION INTERNAL FIXATION (ORIF) DISTAL RADIAL FRACTURE Right 06/30/2015   Procedure: OPEN REDUCTION INTERNAL FIXATION (ORIF) RIGHT DISTAL RADIUS FRACTURE ;  Surgeon: [name], MD;  Location: [facility];  Service: Orthopedics;  Laterality: Right;   OTHER SURGICAL HISTORY Right    leg - melonoma   SHOULDER ARTHROSCOPY WITH OPEN ROTATOR CUFF REPAIR Right 10/02/2017   Procedure: Right shoulder mini open rotator cuff repair;  Surgeon: Susa Day, MD;  Location: [facility];  Service: Orthopedics;  Laterality: Right;  90 mins   TONSILLECTOMY     TOTAL KNEE ARTHROPLASTY  08/11/2011   Procedure: TOTAL KNEE ARTHROPLASTY;  Surgeon: [name], MD;  Location: [facility];  Service: Orthopedics;  Laterality: Left;   TOTAL KNEE ARTHROPLASTY Left 2010    There were no vitals filed for this visit.   Subjective Assessment - 06/07/21 0921     Subjective Pt reports epidural successful. No pain currently.    Pertinent History Anita Bailey is  an 28yoF who comes to OPPT for chronic neck stiffness, motor control deficits, and pain s/p fall in July 2022 with posterior head impact, concussion. Pt reports no fracture or bleed identified at the time, but pt had post concussion persistent imbalance and vertigo for which she completed PT at [facility] in March 2023. Pt did not make much progress in cervical complaint at [facility], therapist recommended a clinica that could offer dry needling. 1986 history of fall with posterior neck impact on steps and subsequent orthopedic trauma/rehab.    Patient Stated Goals Travel plans for the Summer- Pt would like to improve her cervical ROM and improve her end-range pain.    Currently in Pain? No/denies             There.ex:   Seated SNAGS with towel:              Cervical extension: 3x15 with PT demo. Good carryover after demo             Cervical rotation: 3x10/direction. PT  demo and min multimodal cuing for form/technique.   Cervical retraction seated: 1x15, no resistance. 2x8, seated pressing into green ball for resistance  Standing theraband exercises    Scap retraction: GTB, 3x8   Low row/extension GTB: 3x8   T's: GTB: 3x8. Min VC's for cervical positioning and UE positioning. Excellent carryover.    Lat pull down at [place]: 1x15, 20#. Min multimodal cuing for hand positioning and reducing UT activation. Excellent carryover after cues.    Cervical AROM post session:   Flexion: 70 deg  Extension: 62 deg  Rotation R/L: 54 deg/50 deg  Lateral flexion R/L: 38 deg/35 deg    PT Education - 06/07/21 0934     Education Details form/technique with exercise.    Person(s) Educated Patient    Methods Explanation;Demonstration;Tactile cues;Verbal cues    Comprehension Verbalized understanding;Returned demonstration              PT Short Term Goals - 06/05/21 1016       PT SHORT TERM GOAL #1   Title Pt to demonstrate improved DNF endurance test >20sec.    Baseline Eval: 12 sec; 06/05/21: 47.13 sec    Time 3    Period Weeks    Status Achieved    Target Date 06/05/21      PT SHORT TERM GOAL #2   Title Pt to reports good compliance and understanding of HEP for improving neck strength and postural control.    Baseline Issued at eval; 06/05/21: Indep completion with understanding and compliance.    Time 4    Period Weeks    Status Achieved    Target Date 06/05/21               PT Long Term Goals - 06/05/21 1021       PT LONG TERM GOAL #1   Title Patient will improve score on FOTO survey >10 points to indicate improved ability to perform ADL/IADL.    Baseline Eval: 51; 05/30/21 55    Time 6    Period Weeks    Status On-going    Target Date 07/17/21      PT LONG TERM GOAL #2   Title Pt will reports improved return to neutral from end range cervical rotation while driving.    Baseline Unable;    Time 6    Period Weeks    Status New     Target Date 07/17/21  PT LONG TERM GOAL #3   Title Patient will be able to perform a sit to stand transfer without increase in pain to indicate improvement in pain and muscular guarding    Baseline Still having trouble from lower depths and without armrests    Time 6    Period Weeks    Status On-going    Target Date 07/17/21      PT LONG TERM GOAL #4   Title Pt will improve global cervical AROM in all planes > 5 deg for heading turning ADL's such as driving.    Baseline 06/05/21: 40 deg rotation R/L end range pain   Flexion: 60 deg  Extension: 50 deg with end range pain  R/L lateral felxion: 30 deg R, 25 deg to L    Time 6    Period Weeks    Status New    Target Date 07/17/21                   Plan - 06/07/21 0942     Clinical Impression Statement PT at tenth visit leading to progress note. However recertification performed last session on 06/05/21. Pt making progress towards goals but remains limited mostly with cervical mobility. Please see note for details. Focus of session on cervical mobility and periscapular strengthening and postural exercise. Pt tolerating progression of exercise well and demonstrating significant improvements in AROM in all planes with cervical ranges compared to last session. Pt will continue to benefit from skilled PT services to progress cervical mobility.    Personal Factors and Comorbidities Age;Behavior Pattern;Past/Current Experience;Time since onset of injury/illness/exacerbation    Examination-Activity Limitations Stand;Reach Overhead;Transfers    Examination-Participation Restrictions Cleaning;Driving;Medication Management    Stability/Clinical Decision Making Evolving/Moderate complexity    Rehab Potential Good    PT Frequency 2x / week    PT Duration 6 weeks    PT Treatment/Interventions ADLs/Self Care Home Management;Moist Heat;Therapeutic exercise;Patient/family education;Manual techniques;Dry needling;Passive range of motion    PT  Next Visit Plan DN, stretching, cervical/scapular strengthening    PT Home Exercise Plan Access Code: [email or web address]  URL: [email or web address]  Date: 04/23/2021  Prepared by: Rebbeca Paul    Exercises  - Supine Cervical Retraction with Towel  - 3 x daily - 7 x weekly - 1 sets - 15 reps - 3 hold  - Supine Deep Neck Flexor Training - Repetitions  - 3 x daily - 7 x weekly - 1 sets - 10 reps - 5 hold    Consulted and Agree with Plan of Care Patient             Patient will benefit from skilled therapeutic intervention in order to improve the following deficits and impairments:  Decreased coordination, Decreased range of motion, Decreased endurance, Decreased activity tolerance, Improper body mechanics  Visit Diagnosis: Cervicalgia  Muscle weakness (generalized)     Problem List Patient Active Problem List   Diagnosis Date Noted   Overactive bladder 12/23/2018   PAT (paroxysmal atrial tachycardia) ([place]) 09/30/2018   Adult idiopathic generalized osteoporosis 06/04/2018   OSA on CPAP 03/29/2018   Recurrent UTI 12/27/2017   Renal cyst 12/27/2017   Rotator cuff tear, right 10/02/2017   Complete rotator cuff tear 10/02/2017   Tubular adenoma 05/27/2017   Spondylolisthesis 05/13/2017   Spinal stenosis of lumbar region 05/13/2017   Neuritis of saphenous nerve 05/13/2017   Lumbar radiculopathy 05/13/2017   Essential hypertension 07/31/2016   Renal artery stenosis ([facility]) 07/31/2016   DJD (degenerative joint disease) 07/31/2016  Low serum vitamin D 07/09/2015   Distal radius fracture, right 06/30/2015   Hyperlipidemia, mixed 04/10/2015   History of malignant melanoma 02/13/2014   Pulmonary emboli ([place]) 06/17/2013   Pernicious anemia 06/17/2013   Lumbar disc disease 06/17/2013   Cervical disc disease 06/17/2013   Basal cell carcinoma of right cheek 04/28/2013   S/P left TKA 08/11/2011    Salem Caster. Fairly IV, PT, DPT Physical Therapist- [facility]  06/07/2021, 10:23 AM  [place] [facility] [address] [place], [address] Phone: [phone]   Fax:  [phone]  Name: CHARLEY LAFRANCE MRN: [id number] Date of Birth: 1934/03/05

## 2021-06-12 ENCOUNTER — Ambulatory Visit: Payer: Medicare Other | Admitting: Physical Therapy

## 2021-06-12 ENCOUNTER — Encounter: Payer: Self-pay | Admitting: Physical Therapy

## 2021-06-12 DIAGNOSIS — M542 Cervicalgia: Secondary | ICD-10-CM

## 2021-06-12 DIAGNOSIS — M6281 Muscle weakness (generalized): Secondary | ICD-10-CM

## 2021-06-12 NOTE — Therapy (Signed)
[facility] [address] [place], [address] Phone: [phone]   Fax:  [phone]  Physical Therapy Treatment/DC Summary  Patient Details  Name: Anita Bailey MRN: [id number] Date of Birth: Feb 23, 1934 Referring Provider (PT): Rosalia Hammers, DO   Encounter Date: 06/12/2021   PT End of Session - 06/12/21 1057     Visit Number 11    Number of Visits 12    Date for PT Re-Evaluation 07/17/21    Authorization Type Medicare Primary; A[employer] Secondary    Authorization Time Period 06/05/21 - 07/17/21    Authorization - Visit Number 11    PT Start Time 9163    PT Stop Time 1125    PT Time Calculation (min) 34 min    Activity Tolerance Patient tolerated treatment well;No increased pain    Behavior During Therapy WFL for tasks assessed/performed             Past Medical History:  Diagnosis Date   Anemia    Arthritis    Cancer ([place])    hx of melanoma in 1971, basal cell    Cervical disc disease    Chronic kidney disease    current uti 08/05/11    Depression    DVT (deep venous thrombosis) ([facility]) 01/2015   was Eliquis until 06/13/15-    Dysrhythmia    hx of heart racing - in past, "none  in years".  had a stress test > 7 years   History of recurrent UTIs    "bladder infection"  Had a PICC line in past   Hyperlipidemia    Hypertension    Melanoma ([place])    Nephrolithiasis    Pulmonary emboli ([place])    bilateral 8/12 hospitalized    Vitamin D deficiency     Past Surgical History:  Procedure Laterality Date   ABDOMINAL HYSTERECTOMY     1981   BACK SURGERY     BREAST EXCISIONAL BIOPSY Right 1979   Negative   COLONOSCOPY W/ POLYPECTOMY     COLONOSCOPY WITH PROPOFOL N/A 04/29/2017   Procedure: COLONOSCOPY WITH PROPOFOL;  Surgeon: [name], MD;  Location: [facility] ENDOSCOPY;  Service: Endoscopy;  Laterality: N/A;   JOINT REPLACEMENT     bilateral knee   left wrist      surgery x 2    OPEN REDUCTION  INTERNAL FIXATION (ORIF) DISTAL RADIAL FRACTURE Right 06/30/2015   Procedure: OPEN REDUCTION INTERNAL FIXATION (ORIF) RIGHT DISTAL RADIUS FRACTURE ;  Surgeon: [name], MD;  Location: [facility];  Service: Orthopedics;  Laterality: Right;   OTHER SURGICAL HISTORY Right    leg - melonoma   SHOULDER ARTHROSCOPY WITH OPEN ROTATOR CUFF REPAIR Right 10/02/2017   Procedure: Right shoulder mini open rotator cuff repair;  Surgeon: Susa Day, MD;  Location: [facility] ORS;  Service: Orthopedics;  Laterality: Right;  90 mins   TONSILLECTOMY     TOTAL KNEE ARTHROPLASTY  08/11/2011   Procedure: TOTAL KNEE ARTHROPLASTY;  Surgeon: [name], MD;  Location: [facility] ORS;  Service: Orthopedics;  Laterality: Left;   TOTAL KNEE ARTHROPLASTY Left 2010    There were no vitals filed for this visit.   Subjective Assessment - 06/12/21 1057     Subjective Feeling very good overall. Feels ready to d/c PT.    Pertinent History Anita Bailey is an 66yoF who comes to OPPT for chronic neck stiffness, motor control deficits, and pain s/p fall in July 2022 with posterior  head impact, concussion. Pt reports no fracture or bleed identified at the time, but pt had post concussion persistent imbalance and vertigo for which she completed PT at [facility] in March 2023. Pt did not make much progress in cervical complaint at [facility], therapist recommended a clinica that could offer dry needling. 1986 history of fall with posterior neck impact on steps and subsequent orthopedic trauma/rehab.    Patient Stated Goals Travel plans for the Summer- Pt would like to improve her cervical ROM and improve her end-range pain.                 Therapeutic Exercise NuStep for AAROM warm up, seat 9, arms 10 level 4 for 5 minutes    PT reviewed the following HEP with patient with patient able to demonstrate a set of the following with min cuing for correction needed. PT educated patient on parameters of therex (how/when to inc/decrease  intensity, frequency, rep/set range, stretch hold time, and purpose of therex) with verbalized understanding.  Access Code: [place] with Neck Elongation  - 3 x daily - 7 x weekly - 12-20 reps - 2sec hold - Seated Assisted Cervical Rotation with Towel  - 3 x daily - 7 x weekly - 12-20 reps - 2sec hold - Supine Deep Neck Flexor Training - Repetitions  - 2 x daily - 7 x weekly - 5 reps - 10sec hold - Shoulder External Rotation and Scapular Retraction with Resistance  - 2 x weekly - 3 sets - 10 reps                          PT Education - 06/12/21 1057     Education Details d/c HEP and recs    Person(s) Educated Patient    Methods Explanation;Handout;Demonstration    Comprehension Verbalized understanding;Returned demonstration              PT Short Term Goals - 06/05/21 1016       PT SHORT TERM GOAL #1   Title Pt to demonstrate improved DNF endurance test >20sec.    Baseline Eval: 12 sec; 06/05/21: 47.13 sec    Time 3    Period Weeks    Status Achieved    Target Date 06/05/21      PT SHORT TERM GOAL #2   Title Pt to reports good compliance and understanding of HEP for improving neck strength and postural control.    Baseline Issued at eval; 06/05/21: Indep completion with understanding and compliance.    Time 4    Period Weeks    Status Achieved    Target Date 06/05/21               PT Long Term Goals - 06/12/21 1058       PT LONG TERM GOAL #1   Title Patient will improve score on FOTO survey >10 points to indicate improved ability to perform ADL/IADL.    Baseline Eval: 51; 05/30/21 55    Time 6    Period Weeks    Status On-going      PT LONG TERM GOAL #2   Title Pt will reports improved return to neutral from end range cervical rotation while driving.    Baseline 06/12/21 able    Time 6    Period Weeks    Status Achieved      PT LONG TERM GOAL #3   Title Patient will be able  to perform a sit to stand transfer without  increase in pain to indicate improvement in pain and muscular guarding    Baseline Still having trouble from lower depths and without armrests; 06/12/21 able with ease    Time 6    Period Weeks    Status Achieved      PT LONG TERM GOAL #4   Title Pt will improve global cervical AROM in all planes > 5 deg for heading turning ADL's such as driving.    Baseline 06/05/21: 40 deg rotation R/L end range pain   Flexion: 60 deg  Extension: 50 deg with end range pain  R/L lateral felxion: 30 deg R, 25 deg to L; 06/12/21 full R rotation, ext, and flex; L rotation 50d    Time 6    Period Weeks    Status Partially Met                   Plan - 06/12/21 1120     Clinical Impression Statement Pt has met all goals to d/c PT at this time. Patient is able to comply with all cuing, and demonstrate and verbalize understadnign of HEP exercises and recommendations. Pt pleased with progress and willing to participate in HEP for maintenance. Pt given clinic contact info should any further questions or concerns arise.    Personal Factors and Comorbidities Age;Behavior Pattern;Past/Current Experience;Time since onset of injury/illness/exacerbation    Examination-Activity Limitations Stand;Reach Overhead;Transfers    Examination-Participation Restrictions Cleaning;Driving;Medication Management    Stability/Clinical Decision Making Evolving/Moderate complexity    Clinical Decision Making Moderate    Rehab Potential Good    PT Frequency 2x / week    PT Duration 6 weeks    PT Treatment/Interventions ADLs/Self Care Home Management;Moist Heat;Therapeutic exercise;Patient/family education;Manual techniques;Dry needling;Passive range of motion    PT Next Visit Plan DN, stretching, cervical/scapular strengthening    PT Home Exercise Plan Access Code: [email or web address]    Consulted and Agree with Plan of Care Patient             Patient will benefit from skilled therapeutic intervention in order to improve the  following deficits and impairments:  Decreased coordination, Decreased range of motion, Decreased endurance, Decreased activity tolerance, Improper body mechanics  Visit Diagnosis: Cervicalgia  Muscle weakness (generalized)     Problem List Patient Active Problem List   Diagnosis Date Noted   Overactive bladder 12/23/2018   PAT (paroxysmal atrial tachycardia) ([place]) 09/30/2018   Adult idiopathic generalized osteoporosis 06/04/2018   OSA on CPAP 03/29/2018   Recurrent UTI 12/27/2017   Renal cyst 12/27/2017   Rotator cuff tear, right 10/02/2017   Complete rotator cuff tear 10/02/2017   Tubular adenoma 05/27/2017   Spondylolisthesis 05/13/2017   Spinal stenosis of lumbar region 05/13/2017   Neuritis of saphenous nerve 05/13/2017   Lumbar radiculopathy 05/13/2017   Essential hypertension 07/31/2016   Renal artery stenosis ([facility]) 07/31/2016   DJD (degenerative joint disease) 07/31/2016   Low serum vitamin D 07/09/2015   Distal radius fracture, right 06/30/2015   Hyperlipidemia, mixed 04/10/2015   History of malignant melanoma 02/13/2014   Pulmonary emboli ([place]) 06/17/2013   Pernicious anemia 06/17/2013   Lumbar disc disease 06/17/2013   Cervical disc disease 06/17/2013   Basal cell carcinoma of right cheek 04/28/2013   S/P left TKA 08/11/2011   Durwin Reges DPT Durwin Reges, PT 06/12/2021, 11:22 AM  [facility] [address] [place], [address] Phone:  [phone]   Fax:  [phone]  Name: Anita Bailey MRN: [id number] Date of Birth: 16-Mar-1934

## 2021-06-14 ENCOUNTER — Ambulatory Visit: Payer: Medicare Other | Admitting: Physical Therapy

## 2021-11-14 ENCOUNTER — Other Ambulatory Visit: Payer: Self-pay

## 2021-11-14 DIAGNOSIS — G44319 Acute post-traumatic headache, not intractable: Secondary | ICD-10-CM

## 2021-11-18 ENCOUNTER — Ambulatory Visit
Admission: RE | Admit: 2021-11-18 | Discharge: 2021-11-18 | Disposition: A | Discharge status: Home / Self Care | Payer: Medicare Other | Source: Ambulatory Visit | Attending: Infectious Diseases | Admitting: Infectious Diseases

## 2021-11-18 DIAGNOSIS — G44319 Acute post-traumatic headache, not intractable: Secondary | ICD-10-CM | POA: Insufficient documentation

## 2021-12-04 ENCOUNTER — Other Ambulatory Visit: Payer: Self-pay | Admitting: Specialist

## 2021-12-04 DIAGNOSIS — M545 Low back pain, unspecified: Secondary | ICD-10-CM

## 2021-12-13 ENCOUNTER — Inpatient Hospital Stay: Admission: RE | Admit: 2021-12-13 | Payer: Medicare Other | Source: Ambulatory Visit

## 2021-12-23 ENCOUNTER — Inpatient Hospital Stay: Admission: RE | Admit: 2021-12-23 | Payer: Medicare Other | Source: Ambulatory Visit

## 2021-12-30 ENCOUNTER — Other Ambulatory Visit: Payer: Self-pay | Admitting: Internal Medicine

## 2021-12-30 DIAGNOSIS — Z1231 Encounter for screening mammogram for malignant neoplasm of breast: Secondary | ICD-10-CM

## 2022-01-27 ENCOUNTER — Ambulatory Visit
Admission: RE | Admit: 2022-01-27 | Discharge: 2022-01-27 | Disposition: A | Discharge status: Home / Self Care | Payer: Medicare Other | Source: Ambulatory Visit | Attending: Internal Medicine | Admitting: Internal Medicine

## 2022-01-27 DIAGNOSIS — Z1231 Encounter for screening mammogram for malignant neoplasm of breast: Secondary | ICD-10-CM | POA: Insufficient documentation

## 2022-02-12 ENCOUNTER — Ambulatory Visit
Admission: RE | Admit: 2022-02-12 | Discharge: 2022-02-12 | Disposition: A | Discharge status: Home / Self Care | Payer: Medicare Other | Source: Ambulatory Visit | Attending: Specialist | Admitting: Specialist

## 2022-02-12 DIAGNOSIS — M545 Low back pain, unspecified: Secondary | ICD-10-CM

## 2022-02-12 MED ORDER — IOPAMIDOL (ISOVUE-M 200) INJECTION 41%
1.0000 mL | Freq: Once | INTRAMUSCULAR | Status: AC
  Administered 2022-02-12: 1 mL via EPIDURAL

## 2022-02-12 MED ORDER — METHYLPREDNISOLONE ACETATE 40 MG/ML INJ SUSP (RADIOLOG
80.0000 mg | Freq: Once | INTRAMUSCULAR | Status: AC
  Administered 2022-02-12: 80 mg via EPIDURAL

## 2022-02-12 NOTE — Discharge Instructions (Signed)

## 2022-03-19 ENCOUNTER — Other Ambulatory Visit: Payer: Self-pay

## 2022-03-19 ENCOUNTER — Emergency Department: Payer: Medicare Other

## 2022-03-19 ENCOUNTER — Emergency Department
Admission: EM | Admit: 2022-03-19 | Discharge: 2022-03-19 | Disposition: A | Discharge status: Home / Self Care | Payer: Medicare Other | Attending: Emergency Medicine | Admitting: Emergency Medicine

## 2022-03-19 DIAGNOSIS — E041 Nontoxic single thyroid nodule: Secondary | ICD-10-CM | POA: Diagnosis not present

## 2022-03-19 DIAGNOSIS — N189 Chronic kidney disease, unspecified: Secondary | ICD-10-CM | POA: Insufficient documentation

## 2022-03-19 DIAGNOSIS — Z85820 Personal history of malignant melanoma of skin: Secondary | ICD-10-CM | POA: Insufficient documentation

## 2022-03-19 DIAGNOSIS — I129 Hypertensive chronic kidney disease with stage 1 through stage 4 chronic kidney disease, or unspecified chronic kidney disease: Secondary | ICD-10-CM | POA: Diagnosis not present

## 2022-03-19 DIAGNOSIS — S60212A Contusion of left wrist, initial encounter: Secondary | ICD-10-CM | POA: Diagnosis not present

## 2022-03-19 DIAGNOSIS — R519 Headache, unspecified: Secondary | ICD-10-CM | POA: Diagnosis present

## 2022-03-19 DIAGNOSIS — S0990XA Unspecified injury of head, initial encounter: Secondary | ICD-10-CM | POA: Insufficient documentation

## 2022-03-19 DIAGNOSIS — Z79899 Other long term (current) drug therapy: Secondary | ICD-10-CM | POA: Diagnosis not present

## 2022-03-19 DIAGNOSIS — W01119A Fall on same level from slipping, tripping and stumbling with subsequent striking against unspecified sharp object, initial encounter: Secondary | ICD-10-CM | POA: Diagnosis not present

## 2022-03-19 DIAGNOSIS — W19XXXA Unspecified fall, initial encounter: Secondary | ICD-10-CM

## 2022-03-19 NOTE — Discharge Instructions (Addendum)
Your CTs and x-ray showed no traumatic injury today.  You may take over-the-counter Tylenol 1000 mg every 6 hours as needed for pain.  You may wrap your left wrist for compression which can help with pain and swelling.  Please keep this elevated above the level of your heart when at rest and apply ice as needed.  Your CT scan of your neck did incidentally show a thyroid nodule on the right lobe of the thyroid and the radiologist recommends that this be followed by your primary care doctor as an outpatient and that you have an outpatient ultrasound performed.  This does not need to be done emergently.

## 2022-03-19 NOTE — ED Triage Notes (Addendum)
BIB medic from home.  Rpts fall.  Pt tripped over heating pad and hit wall.  Medic rpts pain to L shoulder and L rib area no LOC, pt not on  blood thinners. Pt has hematoma to L wrist BP: 164/78, 60.  99% RA

## 2022-03-19 NOTE — ED Provider Notes (Signed)
[facility] Provider Note    Event Date/Time   First MD Initiated Contact with Patient 03/19/22 [phone]     (approximate)   History   Fall (BIB medic, rpts pt fell and hit wall.  Denies LOC.  Rpts pt rpting L shoulder  and L rib area pain. )   HPI  Anita Bailey is a 88 y.o. female with history of hypertension, hyperlipidemia, PE and DVT no longer on Eliquis who presents to the emergency department after she fell.  States that she has had issues with chronic back pain and has been using a heating pad.  She states that when she was done using the heating pad before she fell asleep she moved it to her nightstand and when she got up around 2 AM during the bathroom she tripped over the cord.  She states she hit the left side of her body and head against the wall and then fell to the ground.  No loss of consciousness but complaining of left-sided headache, neck pain, left rib pain and left wrist pain.  Has been able to ambulate since the fall.   History provided by patient, EMS.    Past Medical History:  Diagnosis Date   Anemia    Arthritis    Cancer ([place])    hx of melanoma in 1971, basal cell    Cervical disc disease    Chronic kidney disease    current uti 08/05/11    Depression    DVT (deep venous thrombosis) ([place]) 01/2015   was Eliquis until 06/13/15-    Dysrhythmia    hx of heart racing - in past, "none  in years".  had a stress test > 7 years   History of recurrent UTIs    "bladder infection"  Had a PICC line in past   Hyperlipidemia    Hypertension    Melanoma ([place])    Nephrolithiasis    Pulmonary emboli ([place])    bilateral 8/12 hospitalized    Vitamin D deficiency     Past Surgical History:  Procedure Laterality Date   ABDOMINAL HYSTERECTOMY     1981   BACK SURGERY     BREAST EXCISIONAL BIOPSY Right 1979   Negative   COLONOSCOPY W/ POLYPECTOMY     COLONOSCOPY WITH PROPOFOL N/A 04/29/2017   Procedure: COLONOSCOPY WITH PROPOFOL;  Surgeon:  [name], MD;  Location: [facility] ENDOSCOPY;  Service: Endoscopy;  Laterality: N/A;   JOINT REPLACEMENT     bilateral knee   left wrist      surgery x 2    OPEN REDUCTION INTERNAL FIXATION (ORIF) DISTAL RADIAL FRACTURE Right 06/30/2015   Procedure: OPEN REDUCTION INTERNAL FIXATION (ORIF) RIGHT DISTAL RADIUS FRACTURE ;  Surgeon: [name], MD;  Location: [place];  Service: Orthopedics;  Laterality: Right;   OTHER SURGICAL HISTORY Right    leg - melonoma   SHOULDER ARTHROSCOPY WITH OPEN ROTATOR CUFF REPAIR Right 10/02/2017   Procedure: Right shoulder mini open rotator cuff repair;  Surgeon: Susa Day, MD;  Location: [facility] ORS;  Service: Orthopedics;  Laterality: Right;  90 mins   TONSILLECTOMY     TOTAL KNEE ARTHROPLASTY  08/11/2011   Procedure: TOTAL KNEE ARTHROPLASTY;  Surgeon: [name], MD;  Location: [facility] ORS;  Service: Orthopedics;  Laterality: Left;   TOTAL KNEE ARTHROPLASTY Left 2010    MEDICATIONS:  Prior to Admission medications   Medication Sig Start Date End Date Taking? Authorizing Provider  Ascorbic Acid (  VITAMIN C) 1000 MG tablet Take 1,000 mg by mouth daily.    [provider]  aspirin-acetaminophen-caffeine (EXCEDRIN MIGRAINE) [id number] MG tablet Take 1 tablet by mouth every 6 (six) hours as needed for headache. 08/28/20   Caryn Section Linden Dolin, PA-C  Cholecalciferol (VITAMIN D3) 5000 units TABS Take 5,000 Units by mouth daily.    [provider]  cyanocobalamin (,VITAMIN B-12,) 1000 MCG/ML injection Inject 1,000 mcg into the muscle every 30 (thirty) days.    [provider]  diclofenac sodium (VOLTAREN) 1 % GEL Apply 2 g topically 2 (two) times daily as needed (For pain.).  05/07/15   [provider]  fluocinonide cream (LIDEX) 0.05 % fluocinonide 0.05 % topical cream  APPLY TWICE DAILY TO RASH UNTIL CLEAR    [provider]  LYRICA 50 MG capsule Take 50 mg by mouth 2 (two) times daily. 08/14/17   [provider]   meclizine (ANTIVERT) 25 MG tablet Take 25 mg by mouth every 8 (eight) hours as needed. 12/21/19   [provider]  methocarbamol (ROBAXIN) 500 MG tablet Take 1 tablet (500 mg total) by mouth 4 (four) times daily. 08/28/20   Fisher, Linden Dolin, PA-C  metoprolol succinate (TOPROL-XL) 50 MG 24 hr tablet Take 50 mg by mouth 2 (two) times daily. Take with or immediately following a meal.    [provider]  mirabegron ER (MYRBETRIQ) 25 MG TB24 tablet Take 1 tablet (25 mg total) by mouth daily. 02/13/20   Stoioff, Ronda Fairly, MD  predniSONE (DELTASONE) 10 MG tablet Take 1 tablet (10 mg total) by mouth as directed. 08/28/20   Fisher, Linden Dolin, PA-C  traMADol (ULTRAM) 50 MG tablet tramadol 50 mg tablet    [provider]  venlafaxine XR (EFFEXOR-XR) 75 MG 24 hr capsule Take 75 mg by mouth daily. 06/05/15   [provider]  zolpidem (AMBIEN) 5 MG tablet Take 2.5-5 mg by mouth at bedtime.     [provider]    Physical Exam   Triage Vital Signs: ED Triage Vitals  Enc Vitals Group     BP --      Pulse Rate 03/19/22 0330 (!) 57     Resp --      Temp 03/19/22 0330 98 F (36.7 C)     Temp Source 03/19/22 0330 Oral     SpO2 03/19/22 0330 98 %     Weight 03/19/22 0326 132 lb (59.9 kg)     Height 03/19/22 0326 '5\' 5"'$  (1.651 m)     Head Circumference --      Peak Flow --      Pain Score --      Pain Loc --      Pain Edu? --      Excl. in [place]? --     Most recent vital signs: Vitals:   03/19/22 0330  BP: (!) 141/57  Pulse: (!) 57  Temp: 98 F (36.7 C)  SpO2: 98%     CONSTITUTIONAL: Alert, responds appropriately to questions. Well-appearing; well-nourished; GCS 15 HEAD: Normocephalic; atraumatic EYES: Conjunctivae clear, PERRL, EOMI ENT: normal nose; no rhinorrhea; moist mucous membranes; pharynx without lesions noted; no dental injury; no septal hematoma, no epistaxis; no facial deformity or bony tenderness NECK: Supple, no midline spinal tenderness,  step-off or deformity; trachea midline CARD: RRR; S1 and S2 appreciated; no murmurs, no clicks, no rubs, no gallops RESP: Normal chest excursion without splinting or tachypnea; breath sounds clear and equal bilaterally; no  wheezes, no rhonchi, no rales; no hypoxia or respiratory distress CHEST:  chest wall stable, no crepitus or ecchymosis or deformity, tender to palpation over the left chest wall ABD/GI: Non-distended; soft, non-tender, no rebound, no guarding; no ecchymosis or other lesions noted PELVIS:  stable, nontender to palpation BACK:  The back appears normal; no midline spinal tenderness, step-off or deformity EXT: Tender to palpation over the left volar wrist with associated soft tissue swelling and bruising but no obvious deformity.  No tenderness over the left hand, proximal forearm, humerus or shoulder.  Normal cap refill.  2+ left radial pulse. SKIN: Normal color for age and race; warm NEURO: No facial asymmetry, normal speech, moving all extremities equally  ED Results / Procedures / Treatments   LABS: (all labs ordered are listed, but only abnormal results are displayed) Labs Reviewed - No data to display   EKG:   RADIOLOGY: My personal review and interpretation of imaging: X-rays show no acute fracture, bleeding.  I have personally reviewed all radiology reports. DG Wrist Complete Left  Result Date: 03/19/2022 CLINICAL DATA:  88 year old female with history of trauma from a fall complaining of bruising and pain in the left wrist. EXAM: LEFT WRIST - COMPLETE 3+ VIEW COMPARISON:  01/29/2018. FINDINGS: Extensive soft tissue swelling is noted anterior to the distal aspect of the radius and ulna. No acute displaced fracture, subluxation or dislocation is identified. Plate and screw fixation hardware in the distal ulnar diaphysis traversing a well-healed fracture. Old healed fractures of the distal radial metaphysis and ulnar styloid are also noted. IMPRESSION: 1. No acute  displaced fracture. Extensive soft tissue swelling anterior to the distal radius and ulna. 2. Status post ORIF the distal ulnar diaphysis with well healed fractures of the distal radius and ulna related to remote trauma, as above. Electronically Signed   By: Vinnie Langton M.D.   On: 03/19/2022 05:04   DG Ribs Unilateral W/Chest Left  Result Date: 03/19/2022 CLINICAL DATA:  88 year old female with history of trauma from a fall with bruising and chest pain. EXAM: LEFT RIBS AND CHEST - 3+ VIEW COMPARISON:  Chest x-ray 12/09/2017. FINDINGS: Lung volumes are normal. No consolidative airspace disease. No pleural effusions. No pneumothorax. Chronic elevation of the minor fissure, unchanged. No pulmonary nodule or mass noted. Pulmonary vasculature and the cardiomediastinal silhouette are within normal limits. Atherosclerosis in the thoracic aorta. Dedicated views of the left ribs demonstrate no definite acute displaced left-sided rib fractures. IMPRESSION: 1. No acute displaced left-sided rib fractures. 2. No radiographic evidence of acute cardiopulmonary disease. 3. Aortic atherosclerosis. Electronically Signed   By: Vinnie Langton M.D.   On: 03/19/2022 05:01   CT HEAD WO CONTRAST (5MM)  Result Date: 03/19/2022 CLINICAL DATA:  Head and neck trauma. EXAM: CT HEAD WITHOUT CONTRAST CT CERVICAL SPINE WITHOUT CONTRAST TECHNIQUE: Multidetector CT imaging of the head and cervical spine was performed following the standard protocol without intravenous contrast. Multiplanar CT image reconstructions of the cervical spine were also generated. RADIATION DOSE REDUCTION: This exam was performed according to the departmental dose-optimization program which includes automated exposure control, adjustment of the mA and/or kV according to patient size and/or use of iterative reconstruction technique. COMPARISON:  Head CT 11/18/2021, cervical spine CT 10/09/2020. FINDINGS: CT HEAD FINDINGS Brain: Her areas mild cerebral atrophy,  small-vessel disease and atrophic ventriculomegaly, without midline shift. Unremarkable brainstem and cerebellum. No new asymmetry is seen worrisome for acute infarct, hemorrhage or mass. The basal cisterns are patent. Vascular: There are patchy  calcifications in the carotid siphons. No hyperdense central vessels. Skull: Negative for fractures or focal lesions. Sinuses/Orbits: No acute finding. Old lens extractions are again shown. Clear sinuses and mastoids. Other: None. CT CERVICAL SPINE FINDINGS Alignment: reversed lordosis is again noted as well as a 3 mm grade 1 degenerative anterolisthesis of C4 on 5. Slight dextroscoliosis. No new or worsening alignment abnormality. Bone-on-bone anterior atlantodental joint space loss with spurring is also unchanged. Skull base and vertebrae: No acute fracture is evident. No primary bone lesion or focal pathologic process. Cervical vertebra are normal in heights. Soft tissues and spinal canal: No prevertebral fluid or swelling. No visible canal hematoma. Minimal calcification noted both proximal cervical ICAs. No laryngeal mass. There is a 2 cm heterogeneous nodule in the inferior pole of the right lobe of the thyroid gland, previously 1.8 cm. Nonemergent ultrasound follow-up recommended if clinically warranted taking into account advanced age. Disc levels: The discs are again noted collapsed from C3-4 through C6-7, with small bidirectional endplate spurs and with posterior disc osteophyte complexes at these levels deforming the ventral cord surface without frank cord compression. The C2-3 and C7-T1 discs are normal in heights. There is facet joint and uncinate spurring at most levels and multilevel moderate to severe foraminal stenosis. Similar findings in 2022. Upper chest: There is bilateral apical pleural-parenchymal scarring. Otherwise negative. Other: None. IMPRESSION: 1. No acute intracranial CT findings or depressed skull fractures. 2. Chronic changes. 3. Osteopenia  and degenerative change of the cervical spine without evidence of fractures or interval changes. 4. 2 cm heterogeneous nodule in the inferior pole of the right lobe of the thyroid gland, previously 1.8 cm. Nonemergent ultrasound follow-up recommended if clinically warranted taking into account advanced age. Electronically Signed   By: Telford Nab M.D.   On: 03/19/2022 04:14   CT Cervical Spine Wo Contrast  Result Date: 03/19/2022 CLINICAL DATA:  Head and neck trauma. EXAM: CT HEAD WITHOUT CONTRAST CT CERVICAL SPINE WITHOUT CONTRAST TECHNIQUE: Multidetector CT imaging of the head and cervical spine was performed following the standard protocol without intravenous contrast. Multiplanar CT image reconstructions of the cervical spine were also generated. RADIATION DOSE REDUCTION: This exam was performed according to the departmental dose-optimization program which includes automated exposure control, adjustment of the mA and/or kV according to patient size and/or use of iterative reconstruction technique. COMPARISON:  Head CT 11/18/2021, cervical spine CT 10/09/2020. FINDINGS: CT HEAD FINDINGS Brain: Her areas mild cerebral atrophy, small-vessel disease and atrophic ventriculomegaly, without midline shift. Unremarkable brainstem and cerebellum. No new asymmetry is seen worrisome for acute infarct, hemorrhage or mass. The basal cisterns are patent. Vascular: There are patchy calcifications in the carotid siphons. No hyperdense central vessels. Skull: Negative for fractures or focal lesions. Sinuses/Orbits: No acute finding. Old lens extractions are again shown. Clear sinuses and mastoids. Other: None. CT CERVICAL SPINE FINDINGS Alignment: reversed lordosis is again noted as well as a 3 mm grade 1 degenerative anterolisthesis of C4 on 5. Slight dextroscoliosis. No new or worsening alignment abnormality. Bone-on-bone anterior atlantodental joint space loss with spurring is also unchanged. Skull base and vertebrae: No  acute fracture is evident. No primary bone lesion or focal pathologic process. Cervical vertebra are normal in heights. Soft tissues and spinal canal: No prevertebral fluid or swelling. No visible canal hematoma. Minimal calcification noted both proximal cervical ICAs. No laryngeal mass. There is a 2 cm heterogeneous nodule in the inferior pole of the right lobe of the thyroid gland, previously 1.8 cm. Nonemergent  ultrasound follow-up recommended if clinically warranted taking into account advanced age. Disc levels: The discs are again noted collapsed from C3-4 through C6-7, with small bidirectional endplate spurs and with posterior disc osteophyte complexes at these levels deforming the ventral cord surface without frank cord compression. The C2-3 and C7-T1 discs are normal in heights. There is facet joint and uncinate spurring at most levels and multilevel moderate to severe foraminal stenosis. Similar findings in 2022. Upper chest: There is bilateral apical pleural-parenchymal scarring. Otherwise negative. Other: None. IMPRESSION: 1. No acute intracranial CT findings or depressed skull fractures. 2. Chronic changes. 3. Osteopenia and degenerative change of the cervical spine without evidence of fractures or interval changes. 4. 2 cm heterogeneous nodule in the inferior pole of the right lobe of the thyroid gland, previously 1.8 cm. Nonemergent ultrasound follow-up recommended if clinically warranted taking into account advanced age. Electronically Signed   By: Telford Nab M.D.   On: 03/19/2022 04:14     PROCEDURES:  Critical Care performed: No    Procedures    IMPRESSION / MDM / ASSESSMENT AND PLAN / ED COURSE  I reviewed the triage vital signs and the nursing notes.  Patient here after mechanical fall.    DIFFERENTIAL DIAGNOSIS (includes but not limited to):   Concussion, intracranial hemorrhage, cervical spine fracture, cervical strain, rib contusion, rib fractures, pneumothorax, wrist  fracture, wrist sprain, wrist contusion  Patient's presentation is most consistent with acute presentation with potential threat to life or bodily function.  PLAN: Will obtain CT head, cervical spine, x-rays of the left wrist and left ribs.  She declines anything for pain.  Will apply ice to the left wrist.   MEDICATIONS GIVEN IN ED: Medications - No data to display   ED COURSE: X-rays and CTs reviewed and interpreted by myself and the radiologist and show no acute traumatic injury other than soft tissue swelling to the left wrist.  Will apply Ace wrap.  Recommended Tylenol at home as needed.  Recommended rest, elevation and ice.  Patient and daughter comfortable with this plan.  Incidentally she does have a thyroid nodule.  I have provided her with this information in her discharge paperwork and recommended follow-up with her PCP for nonemergent outpatient ultrasound of her thyroid.   CONSULTS:  none   OUTSIDE RECORDS REVIEWED:  Reviewed internal medicine visit with Emily Filbert on 02/06/2022.       FINAL CLINICAL IMPRESSION(S) / ED DIAGNOSES   Final diagnoses:  Fall, initial encounter  Injury of head, initial encounter  Right thyroid nodule  Contusion of left wrist, initial encounter     Rx / DC Orders   ED Discharge Orders     None        Note:  This document was prepared using Dragon voice recognition software and may include unintentional dictation errors.   Willson Lipa, Delice Bison, DO 03/19/22 [phone]

## 2022-04-19 ENCOUNTER — Other Ambulatory Visit: Payer: Self-pay | Admitting: Specialist

## 2022-04-19 DIAGNOSIS — M5416 Radiculopathy, lumbar region: Secondary | ICD-10-CM

## 2022-04-24 ENCOUNTER — Ambulatory Visit
Admission: RE | Admit: 2022-04-24 | Discharge: 2022-04-24 | Disposition: A | Discharge status: Home / Self Care | Payer: Medicare Other | Source: Ambulatory Visit | Attending: Specialist | Admitting: Specialist

## 2022-04-24 DIAGNOSIS — M5416 Radiculopathy, lumbar region: Secondary | ICD-10-CM

## 2022-04-24 MED ORDER — METHYLPREDNISOLONE ACETATE 40 MG/ML INJ SUSP (RADIOLOG
80.0000 mg | Freq: Once | INTRAMUSCULAR | Status: AC
Start: 2022-04-24 — End: 2022-04-24
  Administered 2022-04-24: 80 mg via EPIDURAL

## 2022-04-24 MED ORDER — IOPAMIDOL (ISOVUE-M 200) INJECTION 41%
1.0000 mL | Freq: Once | INTRAMUSCULAR | Status: AC
  Administered 2022-04-24: 1 mL via EPIDURAL

## 2022-04-24 NOTE — Discharge Instructions (Signed)

## 2022-06-11 ENCOUNTER — Other Ambulatory Visit: Payer: Self-pay | Admitting: Internal Medicine

## 2022-06-11 DIAGNOSIS — S22000A Wedge compression fracture of unspecified thoracic vertebra, initial encounter for closed fracture: Secondary | ICD-10-CM

## 2022-06-19 ENCOUNTER — Ambulatory Visit: Payer: Medicare Other

## 2022-06-23 ENCOUNTER — Ambulatory Visit
Admission: RE | Admit: 2022-06-23 | Discharge: 2022-06-23 | Disposition: A | Discharge status: Home / Self Care | Payer: Medicare Other | Source: Ambulatory Visit | Attending: Internal Medicine | Admitting: Internal Medicine

## 2022-06-23 DIAGNOSIS — S22000A Wedge compression fracture of unspecified thoracic vertebra, initial encounter for closed fracture: Secondary | ICD-10-CM | POA: Insufficient documentation

## 2022-08-19 ENCOUNTER — Other Ambulatory Visit: Payer: Self-pay | Admitting: Specialist

## 2022-08-19 DIAGNOSIS — M545 Low back pain, unspecified: Secondary | ICD-10-CM

## 2022-09-03 ENCOUNTER — Ambulatory Visit
Admission: RE | Admit: 2022-09-03 | Discharge: 2022-09-03 | Disposition: A | Discharge status: Home / Self Care | Payer: Medicare Other | Source: Ambulatory Visit | Attending: Specialist | Admitting: Specialist

## 2022-09-03 DIAGNOSIS — M545 Low back pain, unspecified: Secondary | ICD-10-CM

## 2022-09-03 MED ORDER — IOPAMIDOL (ISOVUE-M 200) INJECTION 41%
1.0000 mL | Freq: Once | INTRAMUSCULAR | Status: AC
  Administered 2022-09-03: 1 mL via EPIDURAL

## 2022-09-03 MED ORDER — METHYLPREDNISOLONE ACETATE 40 MG/ML INJ SUSP (RADIOLOG
80.0000 mg | Freq: Once | INTRAMUSCULAR | Status: AC
  Administered 2022-09-03: 80 mg via EPIDURAL

## 2022-09-03 NOTE — Discharge Instructions (Signed)

## 2023-06-05 ENCOUNTER — Other Ambulatory Visit: Payer: Self-pay | Admitting: Internal Medicine

## 2023-06-05 DIAGNOSIS — Z1231 Encounter for screening mammogram for malignant neoplasm of breast: Secondary | ICD-10-CM

## 2023-06-09 ENCOUNTER — Other Ambulatory Visit: Payer: Self-pay | Admitting: Specialist

## 2023-06-09 DIAGNOSIS — M545 Low back pain, unspecified: Secondary | ICD-10-CM

## 2023-06-18 ENCOUNTER — Ambulatory Visit
Admission: RE | Admit: 2023-06-18 | Discharge: 2023-06-18 | Disposition: A | Discharge status: Home / Self Care | Source: Ambulatory Visit | Attending: Internal Medicine | Admitting: Internal Medicine

## 2023-06-18 DIAGNOSIS — Z1231 Encounter for screening mammogram for malignant neoplasm of breast: Secondary | ICD-10-CM | POA: Diagnosis present

## 2023-06-26 ENCOUNTER — Other Ambulatory Visit: Payer: Self-pay | Admitting: Anesthesiology

## 2023-06-26 DIAGNOSIS — M5459 Other low back pain: Secondary | ICD-10-CM

## 2023-06-26 DIAGNOSIS — M5416 Radiculopathy, lumbar region: Secondary | ICD-10-CM

## 2023-06-26 IMAGING — MR MR KNEE*L* W/O CM
8 series · 40 of 40 positions shown · non-contrast
Comparison: None.

CLINICAL DATA: Anterior left knee pain after leg gave [DATE]
weeks ago. Prior total knee replacement in 5920.

EXAM:
MRI OF THE LEFT KNEE WITHOUT CONTRAST
TECHNIQUE: Multiplanar, multisequence MR imaging of the knee was performed. No
intravenous contrast was administered.

[Series 4: ax t1_high-bw · axial · left · 4.0mm · 0.62mm/px · z∈[-84,+71]mm · 4 of 32 slices shown]
[im 1/32]
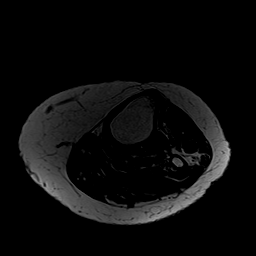
[im 11/32]
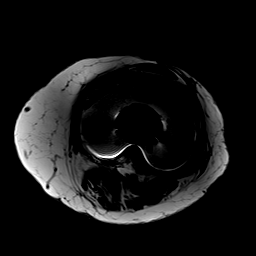
[im 21/32]
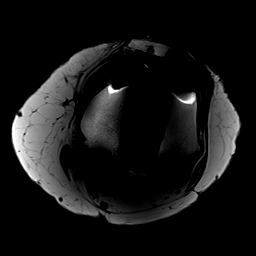
[im 32/32]
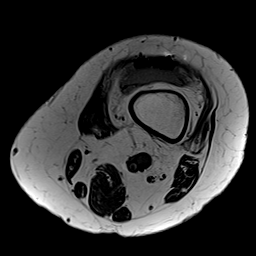

[Series 5: ax stir_high-bw · axial · left · 4.0mm · 0.62mm/px · z∈[-84,+71]mm · 5 of 32 slices shown]
[im 1/32]
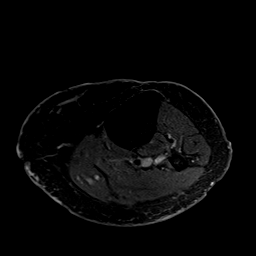
[im 8/32]
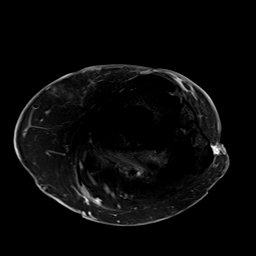
[im 16/32]
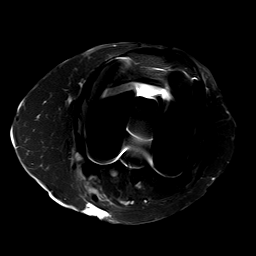
[im 24/32]
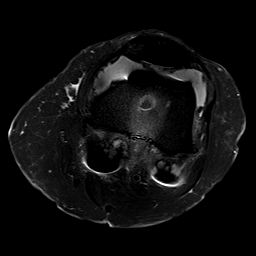
[im 32/32]
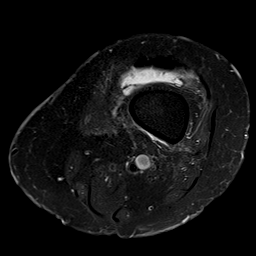

[Series 6: ax pd_high-bw · axial · left · 4.0mm · 0.62mm/px · z∈[-84,+71]mm · 5 of 32 slices shown]
[im 1/32]
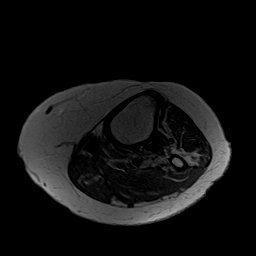
[im 8/32]
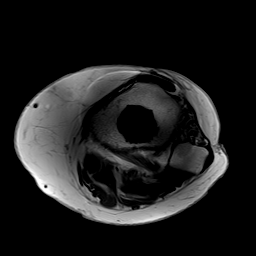
[im 16/32]
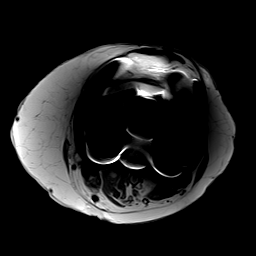
[im 24/32]
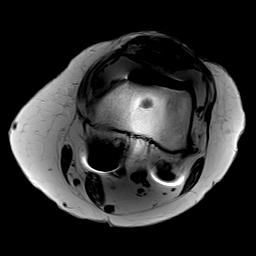
[im 32/32]
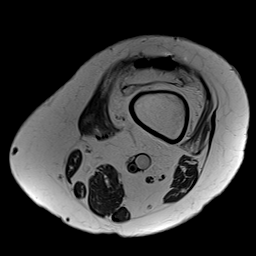

[Series 7: cor t1_high-bw · coronal · left · 4.0mm · 0.62mm/px · 5 of 30 slices shown]
[im 1/30]
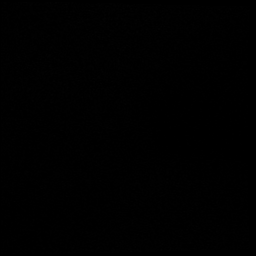
[im 8/30]
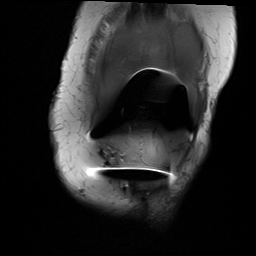
[im 15/30]
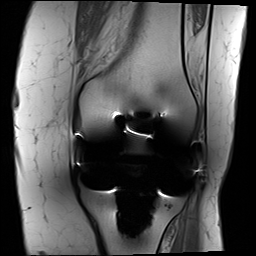
[im 22/30]
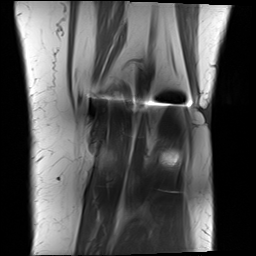
[im 30/30]
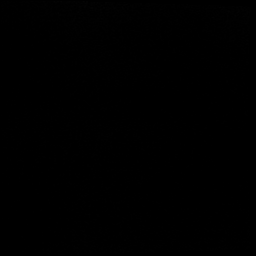

[Series 8: cor pd_high-bw · coronal · left · 4.0mm · 0.62mm/px · 5 of 29 slices shown]
[im 1/29]
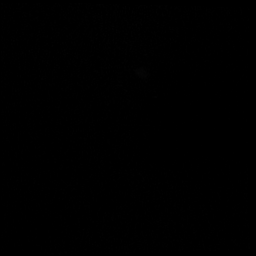
[im 8/29]
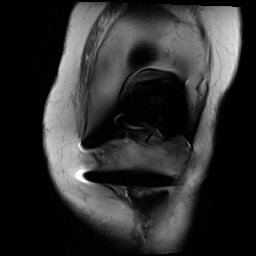
[im 15/29]
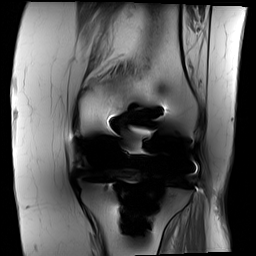
[im 22/29]
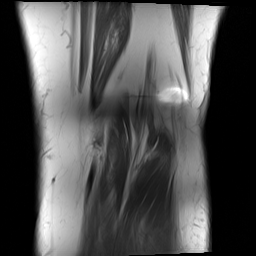
[im 29/29]
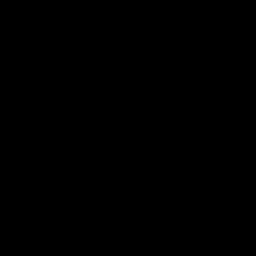

[Series 9: cor stir_high-bw · coronal · left · 4.0mm · 0.62mm/px · 5 of 30 slices shown]
[im 1/30]
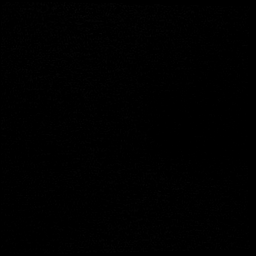
[im 8/30]
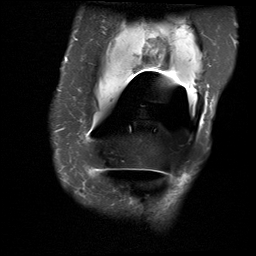
[im 15/30]
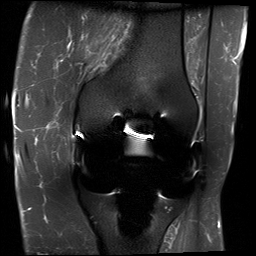
[im 22/30]
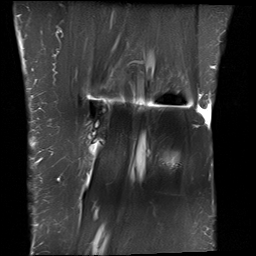
[im 30/30]
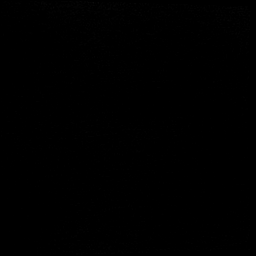

[Series 10: sag stir_high-bw · sagittal · left · 3.0mm · 0.59mm/px · 6 of 36 slices shown]
[im 1/36]
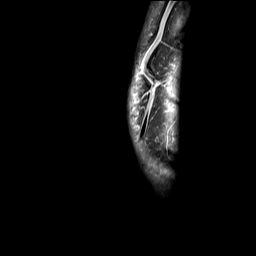
[im 8/36]
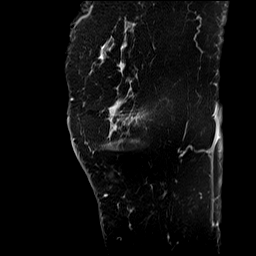
[im 15/36]
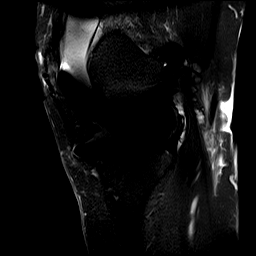
[im 22/36]
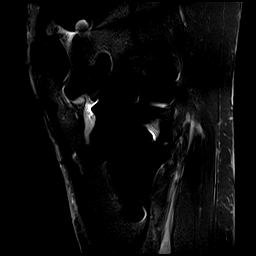
[im 29/36]
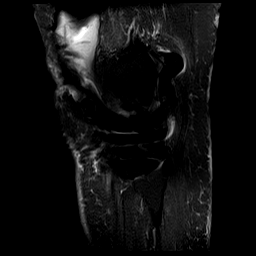
[im 36/36]
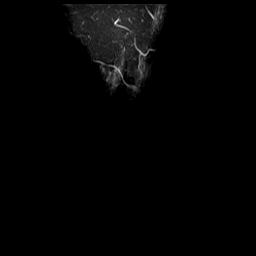

[Series 11: sag pd_high-bw · sagittal · left · 3.0mm · 0.59mm/px · 5 of 34 slices shown]
[im 1/34]
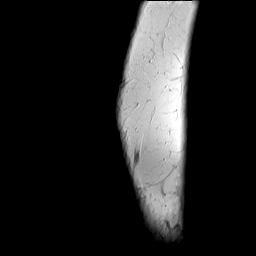
[im 9/34]
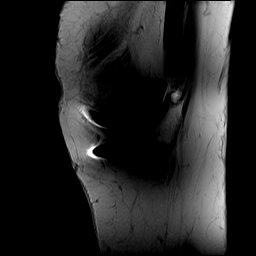
[im 17/34]
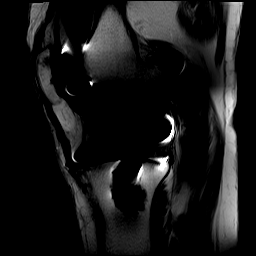
[im 25/34]
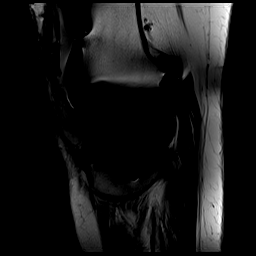
[im 34/34]
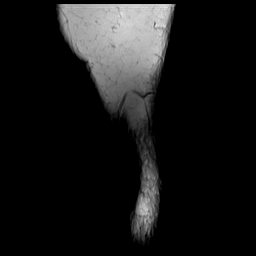

[40 of 40 positions shown; findings below may reference images not displayed]

FINDINGS: Bones/Joint/Cartilage

Prior total knee arthroplasty. Hardware is intact. No marrow signal
abnormality. No fracture or dislocation. Small to moderate joint
effusion. Small Baker cyst.

Ligaments

Collateral ligaments are intact.

Muscles and Tendons
The quadriceps and patellar tendons are intact.

Soft tissue
No fluid collection or hematoma.  No soft tissue mass.
IMPRESSION: 1. No acute abnormality.  Intact extensor mechanism.
2. Prior total knee arthroplasty without hardware complication.
3. Small to moderate joint effusion. Small Baker cyst.

## 2023-07-04 ENCOUNTER — Ambulatory Visit
Admission: RE | Admit: 2023-07-04 | Discharge: 2023-07-04 | Disposition: A | Discharge status: Home / Self Care | Source: Ambulatory Visit | Attending: Anesthesiology | Admitting: Anesthesiology

## 2023-07-04 DIAGNOSIS — M5459 Other low back pain: Secondary | ICD-10-CM

## 2023-07-04 DIAGNOSIS — M5416 Radiculopathy, lumbar region: Secondary | ICD-10-CM

## 2023-08-26 NOTE — Discharge Instructions (Signed)

## 2023-08-27 ENCOUNTER — Ambulatory Visit
Admission: RE | Admit: 2023-08-27 | Discharge: 2023-08-27 | Disposition: A | Discharge status: Home / Self Care | Source: Ambulatory Visit | Attending: Specialist | Admitting: Specialist

## 2023-08-27 DIAGNOSIS — M545 Low back pain, unspecified: Secondary | ICD-10-CM

## 2023-08-27 MED ORDER — IOPAMIDOL (ISOVUE-M 200) INJECTION 41%
10.0000 mL | Freq: Once | INTRAMUSCULAR | Status: AC
  Administered 2023-08-27: 3 mL via EPIDURAL
